# Patient Record
Sex: Female | Born: 1980 | Race: White | Hispanic: No | Marital: Married | Smoking: Never smoker
Health system: Southern US, Community
[De-identification: ages and names within clinical notes are randomized; demographics above are authoritative.]

## PROBLEM LIST (undated history)

## (undated) DIAGNOSIS — D6851 Activated protein C resistance: Secondary | ICD-10-CM

## (undated) DIAGNOSIS — I82409 Acute embolism and thrombosis of unspecified deep veins of unspecified lower extremity: Secondary | ICD-10-CM

## (undated) DIAGNOSIS — Z87442 Personal history of urinary calculi: Secondary | ICD-10-CM

## (undated) DIAGNOSIS — T8859XA Other complications of anesthesia, initial encounter: Secondary | ICD-10-CM

## (undated) DIAGNOSIS — D219 Benign neoplasm of connective and other soft tissue, unspecified: Secondary | ICD-10-CM

## (undated) DIAGNOSIS — G43909 Migraine, unspecified, not intractable, without status migrainosus: Secondary | ICD-10-CM

## (undated) DIAGNOSIS — E282 Polycystic ovarian syndrome: Secondary | ICD-10-CM

## (undated) DIAGNOSIS — D509 Iron deficiency anemia, unspecified: Secondary | ICD-10-CM

## (undated) DIAGNOSIS — J45909 Unspecified asthma, uncomplicated: Secondary | ICD-10-CM

## (undated) DIAGNOSIS — M779 Enthesopathy, unspecified: Secondary | ICD-10-CM

## (undated) DIAGNOSIS — T4145XA Adverse effect of unspecified anesthetic, initial encounter: Secondary | ICD-10-CM

## (undated) HISTORY — PX: MYOMECTOMY: SHX85

## (undated) HISTORY — PX: TONSILLECTOMY: SUR1361

## (undated) HISTORY — DX: Benign neoplasm of connective and other soft tissue, unspecified: D21.9

## (undated) HISTORY — PX: DILATION AND CURETTAGE OF UTERUS: SHX78

## (undated) HISTORY — PX: WISDOM TOOTH EXTRACTION: SHX21

## (undated) HISTORY — PX: WRIST SURGERY: SHX841

## (undated) HISTORY — DX: Iron deficiency anemia, unspecified: D50.9

## (undated) HISTORY — DX: Polycystic ovarian syndrome: E28.2

---
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
## 2006-05-22 ENCOUNTER — Emergency Department: Payer: Self-pay | Admitting: Emergency Medicine

## 2006-06-23 ENCOUNTER — Emergency Department: Payer: Self-pay | Admitting: Emergency Medicine

## 2006-11-15 ENCOUNTER — Emergency Department: Payer: Self-pay | Admitting: Emergency Medicine

## 2007-02-15 ENCOUNTER — Emergency Department: Payer: Self-pay | Admitting: Emergency Medicine

## 2009-04-23 ENCOUNTER — Ambulatory Visit: Payer: Self-pay | Admitting: Gynecologic Oncology

## 2009-05-21 ENCOUNTER — Ambulatory Visit: Payer: Self-pay | Admitting: Gynecologic Oncology

## 2010-11-13 ENCOUNTER — Emergency Department: Payer: Self-pay | Admitting: Emergency Medicine

## 2010-11-17 ENCOUNTER — Emergency Department: Payer: Self-pay | Admitting: Emergency Medicine

## 2011-01-13 ENCOUNTER — Ambulatory Visit: Payer: Self-pay | Admitting: Gynecologic Oncology

## 2011-01-22 ENCOUNTER — Ambulatory Visit: Payer: Self-pay | Admitting: Gynecologic Oncology

## 2011-03-05 ENCOUNTER — Ambulatory Visit: Payer: Self-pay | Admitting: Obstetrics & Gynecology

## 2011-03-11 ENCOUNTER — Ambulatory Visit: Payer: Self-pay | Admitting: Family Medicine

## 2011-03-12 ENCOUNTER — Ambulatory Visit: Payer: Self-pay | Admitting: Obstetrics & Gynecology

## 2011-03-13 LAB — PATHOLOGY REPORT

## 2011-05-12 ENCOUNTER — Ambulatory Visit: Payer: Self-pay | Admitting: Family Medicine

## 2013-04-20 ENCOUNTER — Ambulatory Visit: Payer: Self-pay | Admitting: Gastroenterology

## 2013-04-21 LAB — PATHOLOGY REPORT

## 2013-09-14 ENCOUNTER — Emergency Department: Payer: Self-pay | Admitting: Emergency Medicine

## 2013-09-14 LAB — URINALYSIS, COMPLETE
Glucose,UR: NEGATIVE mg/dL (ref 0–75)
Leukocyte Esterase: NEGATIVE
Nitrite: NEGATIVE
Ph: 5 (ref 4.5–8.0)
RBC,UR: 2731 /HPF (ref 0–5)
Squamous Epithelial: 1
WBC UR: 2 /HPF (ref 0–5)

## 2013-09-14 LAB — BASIC METABOLIC PANEL
Anion Gap: 8 (ref 7–16)
BUN: 17 mg/dL (ref 7–18)
Calcium, Total: 8.9 mg/dL (ref 8.5–10.1)
Co2: 25 mmol/L (ref 21–32)
EGFR (African American): >60
Glucose: 106 mg/dL — ABNORMAL HIGH (ref 65–99)
Osmolality: 278 (ref 275–301)
Sodium: 138 mmol/L (ref 136–145)

## 2013-09-14 LAB — APTT: Activated PTT: 29.3 secs (ref 23.6–35.9)

## 2013-09-14 LAB — CBC
HCT: 36.8 % (ref 35.0–47.0)
HGB: 12.3 g/dL (ref 12.0–16.0)
MCHC: 33.6 g/dL (ref 32.0–36.0)
MCV: 84 fL (ref 80–100)
RDW: 14 % (ref 11.5–14.5)

## 2013-09-14 LAB — HCG, QUANTITATIVE, PREGNANCY: Beta Hcg, Quant.: <1 m[IU]/mL — ABNORMAL LOW

## 2013-09-14 LAB — PROTIME-INR: INR: 0.9

## 2016-01-27 DIAGNOSIS — M7582 Other shoulder lesions, left shoulder: Secondary | ICD-10-CM | POA: Diagnosis not present

## 2016-06-17 DIAGNOSIS — Z124 Encounter for screening for malignant neoplasm of cervix: Secondary | ICD-10-CM | POA: Diagnosis not present

## 2016-06-17 DIAGNOSIS — Z01419 Encounter for gynecological examination (general) (routine) without abnormal findings: Secondary | ICD-10-CM | POA: Diagnosis not present

## 2017-05-08 ENCOUNTER — Ambulatory Visit
Admission: EM | Admit: 2017-05-08 | Discharge: 2017-05-08 | Disposition: A | Discharge status: Home / Self Care | Payer: 59 | Attending: Emergency Medicine | Admitting: Emergency Medicine

## 2017-05-08 DIAGNOSIS — J4521 Mild intermittent asthma with (acute) exacerbation: Secondary | ICD-10-CM | POA: Diagnosis not present

## 2017-05-08 DIAGNOSIS — J069 Acute upper respiratory infection, unspecified: Secondary | ICD-10-CM

## 2017-05-08 DIAGNOSIS — J020 Streptococcal pharyngitis: Secondary | ICD-10-CM | POA: Diagnosis not present

## 2017-05-08 HISTORY — DX: Unspecified asthma, uncomplicated: J45.909

## 2017-05-08 HISTORY — DX: Acute embolism and thrombosis of unspecified deep veins of unspecified lower extremity: I82.409

## 2017-05-08 HISTORY — DX: Migraine, unspecified, not intractable, without status migrainosus: G43.909

## 2017-05-08 HISTORY — DX: Activated protein C resistance: D68.51

## 2017-05-08 HISTORY — DX: Enthesopathy, unspecified: M77.9

## 2017-05-08 MED ORDER — AEROCHAMBER PLUS MISC: 2 refills | Status: AC

## 2017-05-08 MED ORDER — ALBUTEROL SULFATE HFA 108 (90 BASE) MCG/ACT IN AERS: 1.0000 | INHALATION_SPRAY | RESPIRATORY_TRACT | 0 refills | Status: DC | PRN

## 2017-05-08 MED ORDER — PREDNISONE 10 MG (21) PO TBPK: ORAL_TABLET | ORAL | 0 refills | Status: DC

## 2017-05-08 MED ORDER — HYDROCOD POLST-CPM POLST ER 10-8 MG/5ML PO SUER: 5.0000 mL | Freq: Two times a day (BID) | ORAL | 0 refills | Status: DC | PRN

## 2017-05-08 MED ORDER — PENICILLIN V POTASSIUM 500 MG PO TABS: 500.0000 mg | ORAL_TABLET | Freq: Two times a day (BID) | ORAL | 0 refills | Status: DC

## 2017-05-08 NOTE — ED Triage Notes (Signed)
Pt reports a positive strep test at work this a.m. Sore throat and cough x one week. Pain level 2/10. No fevers that she is aware of.

## 2017-05-08 NOTE — Discharge Instructions (Signed)
.    1 gram of Tylenol and 600 mg ibuprofen together 3-4 times a day as needed for pain.  Make sure you drink plenty of extra fluids.  Some people find salt water gargles and  Traditional Medicinal's "Throat Coat" tea helpful. Take 5 mL of liquid Benadryl and 5 mL of Maalox. Mix it together, and then hold it in your mouth for as long as you can and then swallow. You may do this 4 times a day.    Here is a list of primary care providers who are taking new patients:  Dr. Otilio Miu, Dr. Adline Potter [address] [place] [address] [phone]  Dr. Thersa Salt [address]  [place] [address]  [phone]  [facility] [place] [address]  [phone]  [facility] [address] [place], [place] [address] [phone]  [facility] [place]  [phone] [place], [place] [address]  Go to www.goodrx.com to look up your medications. This will give you a list of where you can find your prescriptions at the most affordable prices. Or ask the pharmacist what the cash price is. This can be less expensive than what you would pay with insurance.

## 2017-05-08 NOTE — ED Provider Notes (Signed)
HPI  SUBJECTIVE:  Tara Shea is a 36 y.o. female who presents with  sore throat described as raw, nasal congestion, rhinorrhea, postnasal drip, cough productive of the same material as her nasal congestion starting 6 days ago. She states that her asthma is now acting up due to this with wheezing, chest tightness, shortness of breath and shortness of breath with exertion. States that she is coughing all night long. She has been needing her rescue inhaler more frequently, which she states has stopped working and required a DuoNeb at work. This relieved her symptoms. Symptoms are worse with exertion and cough. No recent steroids. She is not on any control medicines because she only has asthma when she gets ill. 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 She denies was changes, drooling, trismus, sensation of throat swelling shut, difficulty breathing, body aches, rash, abdominal pain. Mild headache. She tried ibuprofen 600 mg without improvement in her symptoms. Sore throat is worse with coughing. She has a past medical history of recurrent strep and is status post tonsillectomy, asthma. No history of mono. LMP: 5/20. Denies possibility of being pregnant. PMD: None.     Past Medical History:  Diagnosis Date  . Asthma   . Bone spur   . DVT (deep venous thrombosis) ([place])   . Factor 5 Leiden mutation, heterozygous ([place])   . Migraines     Past Surgical History:  Procedure Laterality Date  . MYOMECTOMY    . TONSILLECTOMY      History reviewed. No pertinent family history.  Social History  Substance Use Topics  . Smoking status: Never Smoker  . Smokeless tobacco: Never Used  . Alcohol use No    No current facility-administered medications for this encounter.   Current Outpatient Prescriptions:  .  albuterol (PROVENTIL HFA;VENTOLIN HFA) 108 (90 Base) MCG/ACT inhaler, Inhale 1-2 puffs into the lungs every 4 (four) hours as needed for  wheezing or shortness of breath., Disp: 1 Inhaler, Rfl: 0 .  chlorpheniramine-HYDROcodone (TUSSIONEX PENNKINETIC ER) 10-8 MG/5ML SUER, Take 5 mLs by mouth every 12 (twelve) hours as needed for cough., Disp: 120 mL, Rfl: 0 .  penicillin v potassium (VEETID) 500 MG tablet, Take 1 tablet (500 mg total) by mouth 2 (two) times daily. X 10 days, Disp: 20 tablet, Rfl: 0 .  predniSONE (STERAPRED UNI-PAK 21 TAB) 10 MG (21) TBPK tablet, Dispense one 6 day pack. Take as directed with food., Disp: 21 tablet, Rfl: 0 .  Spacer/Aero-Holding Chambers (AEROCHAMBER PLUS) inhaler, Use as instructed, Disp: 1 each, Rfl: 2  No Known Allergies   ROS  As noted in HPI.   Physical Exam  BP 112/74 (BP Location: Right Arm)   Pulse 85   Temp 97.9 F (36.6 C) (Oral)   Resp 16   Ht 5\' 7"  (1.702 m)   Wt 297 lb (134.7 kg)   LMP 04/11/2017   SpO2 100%   BMI 46.52 kg/m   Constitutional: Well developed, well nourished, no acute distress Eyes:  EOMI, conjunctiva normal bilaterally HENT: Normocephalic, atraumatic,mucus membranes moist. Mild nasal congestion, normal nares. No sinus tenderness. Tonsils surgically absent. Oropharynx erythematous, positive petechiae on palate. Uvula midline. No obvious postnasal drip.  Neck: No cervical lymphadenopathy Respiratory: Coughing., Lungs clear bilaterally, good air movement.  no chest wall tenderness Normal inspiratory effort  Cardiovascular: Normal rate regular rhythm no murmurs rubs gallops  GI: nondistended no splenomegaly skin: No rash, skin  intact Musculoskeletal: no deformities Neurologic: Alert & oriented x 3, no focal neuro deficits Psychiatric: Speech and behavior appropriate   ED Course   Medications - No data to display  No orders of the defined types were placed in this encounter.   No results found for this or any previous visit (from the past 24 hour(s)). No results found.  ED Clinical Impression  Pharyngitis due to Streptococcus species  Mild  intermittent asthma with acute exacerbation  Viral upper respiratory illness   ED Assessment/Plan  Did not repeat strep due to positive strep in the ED. Presentation most consistent with a viral URI that has started off her asthma initially, but she may have picked up the strep while at work in the ED. She does have petechiae on her palate. We'll send home with 10 days of penicillin for the strep throat, she is continues ibuprofen 600 mg 1 g of Tylenol 3-4 times a day as needed for pain. Benadryl/Maalox mixture. For the asthma, refilling albuterol inhaler, spacer, prednisone taper, Tussionex. Will provide primary care referral.  Discussed MDM, plan and followup with patient. Discussed sn/sx that should prompt return to the ED. Patient agrees with plan.   Meds ordered this encounter  Medications  . DISCONTD: albuterol (PROVENTIL HFA;VENTOLIN HFA) 108 (90 Base) MCG/ACT inhaler    Sig: Inhale into the lungs every 6 (six) hours as needed for wheezing or shortness of breath.  Marland Kitchen albuterol (PROVENTIL HFA;VENTOLIN HFA) 108 (90 Base) MCG/ACT inhaler    Sig: Inhale 1-2 puffs into the lungs every 4 (four) hours as needed for wheezing or shortness of breath.    Dispense:  1 Inhaler    Refill:  0  . Spacer/Aero-Holding Chambers (AEROCHAMBER PLUS) inhaler    Sig: Use as instructed    Dispense:  1 each    Refill:  2  . chlorpheniramine-HYDROcodone (TUSSIONEX PENNKINETIC ER) 10-8 MG/5ML SUER    Sig: Take 5 mLs by mouth every 12 (twelve) hours as needed for cough.    Dispense:  120 mL    Refill:  0  . penicillin v potassium (VEETID) 500 MG tablet    Sig: Take 1 tablet (500 mg total) by mouth 2 (two) times daily. X 10 days    Dispense:  20 tablet    Refill:  0  . predniSONE (STERAPRED UNI-PAK 21 TAB) 10 MG (21) TBPK tablet    Sig: Dispense one 6 day pack. Take as directed with food.    Dispense:  21 tablet    Refill:  0    *This clinic note was created using [employer]. Therefore,  there may be occasional mistakes despite careful proofreading.  ?   Melynda Ripple, MD 05/08/17 [phone]

## 2018-03-16 DIAGNOSIS — S83282A Other tear of lateral meniscus, current injury, left knee, initial encounter: Secondary | ICD-10-CM | POA: Diagnosis not present

## 2018-03-21 ENCOUNTER — Ambulatory Visit
Admission: RE | Admit: 2018-03-21 | Discharge: 2018-03-21 | Disposition: A | Discharge status: Home / Self Care | Payer: 59 | Source: Ambulatory Visit | Attending: Orthopedic Surgery | Admitting: Orthopedic Surgery

## 2018-03-21 ENCOUNTER — Other Ambulatory Visit: Payer: Self-pay | Admitting: Orthopedic Surgery

## 2018-03-21 DIAGNOSIS — R52 Pain, unspecified: Secondary | ICD-10-CM

## 2018-03-21 DIAGNOSIS — M7989 Other specified soft tissue disorders: Secondary | ICD-10-CM | POA: Diagnosis not present

## 2018-03-21 DIAGNOSIS — M25562 Pain in left knee: Secondary | ICD-10-CM | POA: Diagnosis not present

## 2018-03-21 DIAGNOSIS — M25569 Pain in unspecified knee: Secondary | ICD-10-CM | POA: Diagnosis present

## 2018-06-15 ENCOUNTER — Other Ambulatory Visit: Payer: Self-pay

## 2018-06-15 ENCOUNTER — Ambulatory Visit
Admission: EM | Admit: 2018-06-15 | Discharge: 2018-06-15 | Disposition: A | Discharge status: Home / Self Care | Payer: 59 | Attending: Family Medicine | Admitting: Family Medicine

## 2018-06-15 ENCOUNTER — Encounter: Payer: Self-pay | Admitting: Emergency Medicine

## 2018-06-15 ENCOUNTER — Ambulatory Visit: Payer: Self-pay | Admitting: Obstetrics and Gynecology

## 2018-06-15 DIAGNOSIS — L03313 Cellulitis of chest wall: Secondary | ICD-10-CM

## 2018-06-15 DIAGNOSIS — L0390 Cellulitis, unspecified: Secondary | ICD-10-CM

## 2018-06-15 DIAGNOSIS — N611 Abscess of the breast and nipple: Secondary | ICD-10-CM | POA: Diagnosis not present

## 2018-06-15 DIAGNOSIS — L98499 Non-pressure chronic ulcer of skin of other sites with unspecified severity: Secondary | ICD-10-CM

## 2018-06-15 MED ORDER — SULFAMETHOXAZOLE-TRIMETHOPRIM 800-160 MG PO TABS: 1.0000 | ORAL_TABLET | Freq: Two times a day (BID) | ORAL | 0 refills | Status: DC

## 2018-06-15 NOTE — ED Triage Notes (Signed)
Patient reports that she has a sore on her left breast for the past 10 days.  Patient reports that she previously had a rash and cellulitis in that area.

## 2018-06-15 NOTE — ED Provider Notes (Signed)
[facility]    CSN: [id number] Arrival date & time: 06/15/18  1538     History   Chief Complaint Chief Complaint  Patient presents with  . Sore    HPI Tara Shea is a 37 y.o. female.   37 yo female with a c/o sore under left breast for about one week. Patient has been putting topical clindamycin cream to the area. Denies any fevers or chills. Has had a slight drainage.   The history is provided by the patient.    Past Medical History:  Diagnosis Date  . Asthma   . Bone spur   . DVT (deep venous thrombosis) ([place])   . Factor 5 Leiden mutation, heterozygous ([place])   . Migraines     There are no active problems to display for this patient.   Past Surgical History:  Procedure Laterality Date  . MYOMECTOMY    . TONSILLECTOMY    . WISDOM TOOTH EXTRACTION      OB History   None      Home Medications    Prior to Admission medications   Medication Sig Start Date End Date Taking? Authorizing Provider  albuterol (PROVENTIL HFA;VENTOLIN HFA) 108 (90 Base) MCG/ACT inhaler Inhale 1-2 puffs into the lungs every 4 (four) hours as needed for wheezing or shortness of breath. 05/08/17  Yes Melynda Ripple, MD  chlorpheniramine-HYDROcodone [facility] ER) 10-8 MG/5ML SUER Take 5 mLs by mouth every 12 (twelve) hours as needed for cough. 05/08/17   Melynda Ripple, MD  penicillin v potassium (VEETID) 500 MG tablet Take 1 tablet (500 mg total) by mouth 2 (two) times daily. X 10 days 05/08/17   Melynda Ripple, MD  predniSONE (STERAPRED UNI-PAK 21 TAB) 10 MG (21) TBPK tablet Dispense one 6 day pack. Take as directed with food. 05/08/17   Melynda Ripple, MD  Spacer/Aero-Holding Chambers (AEROCHAMBER PLUS) inhaler Use as instructed 05/08/17   Melynda Ripple, MD  sulfamethoxazole-trimethoprim (BACTRIM DS,SEPTRA DS) 800-160 MG tablet Take 1 tablet by mouth 2 (two) times daily. 06/15/18   Norval Gable, MD    Family History History reviewed. No  pertinent family history.  Social History Social History   Tobacco Use  . Smoking status: Never Smoker  . Smokeless tobacco: Never Used  Substance Use Topics  . Alcohol use: No  . Drug use: No     Allergies   Patient has no known allergies.   Review of Systems Review of Systems   Physical Exam Triage Vital Signs ED Triage Vitals  Enc Vitals Group     BP 06/15/18 1554 (!) 126/91     Pulse Rate 06/15/18 1554 91     Resp 06/15/18 1554 16     Temp 06/15/18 1554 98.2 F (36.8 C)     Temp Source 06/15/18 1554 Oral     SpO2 06/15/18 1554 99 %     Weight 06/15/18 1551 (!) 310 lb (140.6 kg)     Height 06/15/18 1551 5\' 8"  (1.727 m)     Head Circumference --      Peak Flow --      Pain Score 06/15/18 1550 0     Pain Loc --      Pain Edu? --      Excl. in [place]? --    No data found.  Updated Vital Signs BP (!) 126/91 (BP Location: Right Arm)   Pulse 91   Temp 98.2 F (36.8 C) (Oral)   Resp 16   Ht  5\' 8"  (1.727 m)   Wt (!) 310 lb (140.6 kg)   LMP 06/14/2018 (Exact Date)   SpO2 99%   BMI 47.14 kg/m   Visual Acuity Right Eye Distance:   Left Eye Distance:   Bilateral Distance:    Right Eye Near:   Left Eye Near:    Bilateral Near:     Physical Exam  Constitutional: She appears well-developed and well-nourished. No distress.  Skin: She is not diaphoretic.  1.5cm superficial skin ulceration under left breast with mild surrounding blanchable erythema and mild tenderness to palpation; no drainage  Nursing note and vitals reviewed.    UC Treatments / Results  Labs (all labs ordered are listed, but only abnormal results are displayed) Labs Reviewed - No data to display  EKG None  Radiology No results found.  Procedures Procedures (including critical care time)  Medications Ordered in UC Medications - No data to display  Initial Impression / Assessment and Plan / UC Course  I have reviewed the triage vital signs and the nursing notes.  Pertinent  labs & imaging results that were available during my care of the patient were reviewed by me and considered in my medical decision making (see chart for details).      Final Clinical Impressions(s) / UC Diagnoses   Final diagnoses:  Ulcer of skin of breast  Cellulitis, unspecified cellulitis site    ED Prescriptions    Medication Sig Dispense Auth. Provider   sulfamethoxazole-trimethoprim (BACTRIM DS,SEPTRA DS) 800-160 MG tablet Take 1 tablet by mouth 2 (two) times daily. 14 tablet Bryden Darden, Linward Foster, MD     1. diagnosis reviewed with patient 2. rx as per orders above; reviewed possible side effects, interactions, risks and benefits  3. Follow-up prn if symptoms worsen or don't improve  Controlled Substance Prescriptions [place] Controlled Substance Registry consulted? Not Applicable   Norval Gable, MD 06/15/18 [phone]

## 2018-06-29 ENCOUNTER — Encounter: Payer: Self-pay | Admitting: Maternal Newborn

## 2018-06-29 ENCOUNTER — Other Ambulatory Visit (HOSPITAL_COMMUNITY)
Admission: RE | Admit: 2018-06-29 | Discharge: 2018-06-29 | Disposition: A | Discharge status: Home / Self Care | Payer: 59 | Source: Ambulatory Visit | Attending: Maternal Newborn | Admitting: Maternal Newborn

## 2018-06-29 ENCOUNTER — Ambulatory Visit (INDEPENDENT_AMBULATORY_CARE_PROVIDER_SITE_OTHER): Payer: 59 | Admitting: Maternal Newborn

## 2018-06-29 VITALS — BP 120/86 | HR 90 | Ht 68.0 in | Wt 303.8 lb

## 2018-06-29 DIAGNOSIS — Z124 Encounter for screening for malignant neoplasm of cervix: Secondary | ICD-10-CM

## 2018-06-29 DIAGNOSIS — Z1151 Encounter for screening for human papillomavirus (HPV): Secondary | ICD-10-CM | POA: Insufficient documentation

## 2018-06-29 DIAGNOSIS — N946 Dysmenorrhea, unspecified: Secondary | ICD-10-CM

## 2018-06-29 DIAGNOSIS — Z1329 Encounter for screening for other suspected endocrine disorder: Secondary | ICD-10-CM

## 2018-06-29 DIAGNOSIS — Z01411 Encounter for gynecological examination (general) (routine) with abnormal findings: Secondary | ICD-10-CM | POA: Diagnosis not present

## 2018-06-29 DIAGNOSIS — R112 Nausea with vomiting, unspecified: Secondary | ICD-10-CM | POA: Diagnosis not present

## 2018-06-29 DIAGNOSIS — Z01419 Encounter for gynecological examination (general) (routine) without abnormal findings: Secondary | ICD-10-CM

## 2018-06-29 MED ORDER — ONDANSETRON 4 MG PO TBDP: 4.0000 mg | ORAL_TABLET | Freq: Four times a day (QID) | ORAL | 3 refills | Status: DC | PRN

## 2018-06-29 MED ORDER — KETOROLAC TROMETHAMINE 10 MG PO TABS: 10.0000 mg | ORAL_TABLET | Freq: Four times a day (QID) | ORAL | 0 refills | Status: DC | PRN

## 2018-06-29 NOTE — Progress Notes (Signed)
Gynecology Annual Exam  PCP: Arnetha Courser, MD  Chief Complaint:  Chief Complaint  Patient presents with  . 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 No obstetric history on file, presenting for an annual exam. The patient has no complaints today.   LMP: Patient's last menstrual period was 06/14/2018 (exact date). Average Interval: irregular Duration of flow: 7 days Heavy Menses: yes Intermenstrual Bleeding: no Postcoital Bleeding: no Dysmenorrhea: yes  The patient is sexually active. She currently uses condoms for contraception. She denies dyspareunia.  The patient does perform self breast exams.  There is notable family history of breast or ovarian cancer in her family.  The patient wears seatbelts: yes.   The patient has regular exercise: no.    The patient denies current symptoms of depression.    Review of Systems  Constitutional: Positive for malaise/fatigue.  HENT: Negative.   Eyes: Negative.   Respiratory: Negative for cough, shortness of breath and wheezing.   Cardiovascular: Negative for chest pain and palpitations.  Gastrointestinal: Positive for nausea.       With menstrual cycle  Genitourinary: Negative.   Musculoskeletal: Negative.   Skin: Negative.   Neurological: Positive for headaches.  Endo/Heme/Allergies: Negative.   Psychiatric/Behavioral: Negative.   All other systems reviewed and are negative.   Past Medical History:  Past Medical History:  Diagnosis Date  . Asthma   . Bone spur   . DVT (deep venous thrombosis) ([place])   . Factor 5 Leiden mutation, heterozygous ([place])   . Fibroid   . Migraines   . PCOS (polycystic ovarian syndrome)     Past Surgical History:  Past Surgical History:  Procedure Laterality Date  . MYOMECTOMY    . TONSILLECTOMY    . WISDOM TOOTH EXTRACTION    . WRIST SURGERY     age 60 - rebroke  it b/c it grew back wrong    Gynecologic History:  Patient's last menstrual period was 06/14/2018 (exact date). Contraception: condoms Last Pap: 06/17/2016.  Results were: NIL and HR HPV negative   Obstetric History: No obstetric history on file.  Family History:  Family History  Problem Relation Age of Onset  . Heart murmur Mother   . Thyroid disease Mother   . Migraines Mother   . Asthma Mother   . Diabetes Mother        TYPE 2  . Breast cancer Maternal Aunt 57  . Cancer Maternal Uncle        skin  . Cancer Maternal Grandmother 25       colon  . Hypertension Maternal Grandmother   . Cancer Maternal Aunt 50       colon  . Diabetes Maternal Aunt     Social History:  Social History   Socioeconomic History  . Marital status: Married    Spouse name: Not on file  . Number of children: 0  . Years of education: 3  . Highest education level: Not on file  Occupational History  . Occupation: Therapist, sports    Comment: ED  Social Needs  . Financial resource strain: Not on file  . Food insecurity:    Worry: Not on file    Inability: Not on file  . Transportation needs:    Medical: Not on file    Non-medical: Not on file  Tobacco  Use  . Smoking status: Never Smoker  . Smokeless tobacco: Never Used  Substance and Sexual Activity  . Alcohol use: Yes    Comment: occ  . Drug use: No  . Sexual activity: Yes    Birth control/protection: Condom  Lifestyle  . Physical activity:    Days per week: Not on file    Minutes per session: Not on file  . Stress: Not on file  Relationships  . Social connections:    Talks on phone: Not on file    Gets together: Not on file    Attends religious service: Not on file    Active member of club or organization: Not on file    Attends meetings of clubs or organizations: Not on file    Relationship status: Not on file  . Intimate partner violence:    Fear of current or ex partner: Not on file    Emotionally abused: Not on file    Physically  abused: Not on file    Forced sexual activity: Not on file  Other Topics Concern  . Not on file  Social History Narrative  . Not on file    Allergies:  No Known Allergies  Medications: Prior to Admission medications   Medication Sig Start Date End Date Taking? Authorizing Provider  albuterol (PROVENTIL HFA;VENTOLIN HFA) 108 (90 Base) MCG/ACT inhaler Inhale 1-2 puffs into the lungs every 4 (four) hours as needed for wheezing or shortness of breath. 05/08/17   Melynda Ripple, MD  chlorpheniramine-HYDROcodone (TUSSIONEX PENNKINETIC ER) 10-8 MG/5ML SUER Take 5 mLs by mouth every 12 (twelve) hours as needed for cough. 05/08/17   Melynda Ripple, MD  penicillin v potassium (VEETID) 500 MG tablet Take 1 tablet (500 mg total) by mouth 2 (two) times daily. X 10 days 05/08/17   Melynda Ripple, MD  predniSONE (STERAPRED UNI-PAK 21 TAB) 10 MG (21) TBPK tablet Dispense one 6 day pack. Take as directed with food. 05/08/17   Melynda Ripple, MD  Spacer/Aero-Holding Chambers (AEROCHAMBER PLUS) inhaler Use as instructed 05/08/17   Melynda Ripple, MD  sulfamethoxazole-trimethoprim (BACTRIM DS,SEPTRA DS) 800-160 MG tablet Take 1 tablet by mouth 2 (two) times daily. 06/15/18   Norval Gable, MD    Physical Exam Vitals: Blood pressure 120/86, pulse 90, height 5\' 8"  (1.727 m), weight (!) 303 lb 12 oz (137.8 kg), last menstrual period 06/14/2018.  General: NAD HEENT: normocephalic, anicteric Thyroid: no enlargement, no palpable nodules Pulmonary: No increased work of breathing, CTAB Cardiovascular: RRR, no murmurs, rubs, or gallops Breast: Breasts symmetrical, no tenderness, no palpable nodules or masses, no skin or nipple retraction present, no nipple discharge.  No axillary or supraclavicular lymphadenopathy. Healing area on left breast from cellulitis. Abdomen: Soft, non-tender, non-distended.  Umbilicus without lesions.  No hepatomegaly, splenomegaly or masses palpable. No evidence of hernia    Genitourinary:  External: Normal external female genitalia.  Normal  urethral meatus, normal Bartholin's and Skene's glands.    Vagina: Normal vaginal mucosa, no evidence of prolapse.    Cervix: Grossly normal in appearance, no bleeding  Uterus: Non-enlarged, mobile, normal contour.  No CMT  Adnexa: ovaries non-enlarged, no adnexal masses  Rectal: deferred  Lymphatic: no evidence of inguinal lymphadenopathy Extremities: no edema, erythema, or tenderness Neurologic: Grossly intact Psychiatric: mood appropriate, affect full  Assessment: 37 y.o. No obstetric history on file, routine annual exam.  Plan: Problem List Items Addressed This Visit    None    Visit Diagnoses    Women's annual routine  gynecological examination    -  Primary   Relevant Orders   Thyroid Panel With TSH (Completed)   Cytology - PAP (Completed)   Screening for thyroid disorder       Relevant Orders   Thyroid Panel With TSH (Completed)   Pap smear for cervical cancer screening       Relevant Orders   Cytology - PAP (Completed)   Non-intractable vomiting with nausea, unspecified vomiting type       Relevant Medications   ondansetron (ZOFRAN ODT) 4 MG disintegrating tablet   Dysmenorrhea       Relevant Medications   ketorolac (TORADOL) 10 MG tablet      1) STI screening was offered and declined.  2) ASCCP guidelines and rationale discussed.  Patient opts for every 3 year screening interval.  3) Contraception - Currently using condoms, history of migraine and DVT, not currently interested in a different method.  4) Routine healthcare maintenance discussed; thyroid labs ordered today.  5) Follow up 1 year for routine annual exam.  Avel Sensor, CNM 06/29/2018

## 2018-06-30 LAB — THYROID PANEL WITH TSH
FREE THYROXINE INDEX: 2.3 (ref 1.2–4.9)
T3 Uptake Ratio: 31 % (ref 24–39)
T4 TOTAL: 7.5 ug/dL (ref 4.5–12.0)
TSH: 2.6 u[IU]/mL (ref 0.450–4.500)

## 2018-07-01 LAB — CYTOLOGY - PAP
Adequacy: ABSENT
Diagnosis: NEGATIVE
HPV: NOT DETECTED

## 2018-07-04 ENCOUNTER — Encounter: Payer: Self-pay | Admitting: Maternal Newborn

## 2018-08-02 ENCOUNTER — Ambulatory Visit: Payer: 59 | Admitting: Family Medicine

## 2018-08-02 ENCOUNTER — Encounter: Payer: Self-pay | Admitting: Family Medicine

## 2018-08-02 VITALS — BP 124/78 | HR 102 | Temp 98.7°F | Ht 68.0 in | Wt 307.1 lb

## 2018-08-02 DIAGNOSIS — J4520 Mild intermittent asthma, uncomplicated: Secondary | ICD-10-CM | POA: Diagnosis not present

## 2018-08-02 DIAGNOSIS — N200 Calculus of kidney: Secondary | ICD-10-CM | POA: Diagnosis not present

## 2018-08-02 DIAGNOSIS — Z0000 Encounter for general adult medical examination without abnormal findings: Secondary | ICD-10-CM

## 2018-08-02 DIAGNOSIS — G43109 Migraine with aura, not intractable, without status migrainosus: Secondary | ICD-10-CM

## 2018-08-02 LAB — LIPID PANEL
CHOLESTEROL: 184 mg/dL (ref <200)
HDL: 64 mg/dL (ref >50)
LDL CHOLESTEROL (CALC): 103 mg/dL — AB
Non-HDL Cholesterol (Calc): 120 mg/dL (calc) (ref <130)
TRIGLYCERIDES: 76 mg/dL (ref <150)
Total CHOL/HDL Ratio: 2.9 (calc) (ref <5.0)

## 2018-08-02 LAB — CBC WITH DIFFERENTIAL/PLATELET
BASOS ABS: 28 {cells}/uL (ref 0–200)
Basophils Relative: 0.5 %
EOS ABS: 138 {cells}/uL (ref 15–500)
Eosinophils Relative: 2.5 %
HCT: 37.1 % (ref 35.0–45.0)
Hemoglobin: 11.9 g/dL (ref 11.7–15.5)
Lymphs Abs: 2035 cells/uL (ref 850–3900)
MCH: 26 pg — AB (ref 27.0–33.0)
MCHC: 32.1 g/dL (ref 32.0–36.0)
MCV: 81 fL (ref 80.0–100.0)
MONOS PCT: 6 %
MPV: 9.7 fL (ref 7.5–12.5)
NEUTROS PCT: 54 %
Neutro Abs: 2970 cells/uL (ref 1500–7800)
PLATELETS: 258 10*3/uL (ref 140–400)
RBC: 4.58 10*6/uL (ref 3.80–5.10)
RDW: 14 % (ref 11.0–15.0)
TOTAL LYMPHOCYTE: 37 %
WBC mixed population: 330 cells/uL (ref 200–950)
WBC: 5.5 10*3/uL (ref 3.8–10.8)

## 2018-08-02 LAB — COMPLETE METABOLIC PANEL WITH GFR
AG Ratio: 1.3 (calc) (ref 1.0–2.5)
ALKALINE PHOSPHATASE (APISO): 59 U/L (ref 33–115)
ALT: 10 U/L (ref 6–29)
AST: 13 U/L (ref 10–30)
Albumin: 4 g/dL (ref 3.6–5.1)
BILIRUBIN TOTAL: 0.3 mg/dL (ref 0.2–1.2)
BUN: 13 mg/dL (ref 7–25)
CHLORIDE: 106 mmol/L (ref 98–110)
CO2: 23 mmol/L (ref 20–32)
Calcium: 9.1 mg/dL (ref 8.6–10.2)
Creat: 0.79 mg/dL (ref 0.50–1.10)
GFR, Est African American: 111 mL/min/{1.73_m2} (ref >=60)
GFR, Est Non African American: 96 mL/min/{1.73_m2} (ref >=60)
GLUCOSE: 95 mg/dL (ref 65–99)
Globulin: 3 g/dL (calc) (ref 1.9–3.7)
POTASSIUM: 4 mmol/L (ref 3.5–5.3)
Sodium: 137 mmol/L (ref 135–146)
Total Protein: 7 g/dL (ref 6.1–8.1)

## 2018-08-02 NOTE — Assessment & Plan Note (Signed)
Offered patient assistance; she politely declined, saying she knows what to do

## 2018-08-02 NOTE — Assessment & Plan Note (Addendum)
Controlled; flu shots at work; she may call for daily controller if she starts working out more often and needs Rx

## 2018-08-02 NOTE — Patient Instructions (Addendum)
Check out the information at familydoctor.org entitled "Nutrition for Weight Loss: What You Need to Know about Fad Diets" Try to lose between 1-2 pounds per week by taking in fewer calories and burning off more calories You can succeed by limiting portions, limiting foods dense in calories and fat, becoming more active, and drinking 8 glasses of water a day (64 ounces) Don't skip meals, especially breakfast, as skipping meals may alter your metabolism Do not use over-the-counter weight loss pills or gimmicks that claim rapid weight loss A healthy BMI (or body mass index) is between 18.5 and 24.9 You can calculate your ideal BMI at the [place] website [employer]Mon[employer]fr  If you have not heard anything from my staff in a week about any orders/referrals/studies from today, please contact us here to follow-up [phone]

## 2018-08-02 NOTE — Progress Notes (Signed)
BP 124/78   Pulse (!) 102   Temp 98.7 F (37.1 C)   Ht 5\' 8"  (1.727 m)   Wt (!) 307 lb 1.6 oz (139.3 kg)   LMP 07/07/2018   SpO2 99%   BMI 46.69 kg/m    Subjective:    Patient ID: Tara Shea, female    DOB: 1981-05-18, 37 y.o.   MRN: [id number]  HPI: Tara Shea is a 37 y.o. female  Chief Complaint  Patient presents with  . New Patient (Initial Visit)    HPI  Patient is here to establish care  She has been going to [facility] for pap smears; she had a normal one a few weeks ago  Migraines; not too often; none in 1.5 to 2 years; seasonal allergies or sinus infections; mother has migraines frequently; she has multiple health issues and takes medicines so some of her headaches might be from meds; some might be psych issues; patient's migraines are with aura and unilateral pain and nausea; just nasal imitrex  Factor V leiden, uses condoms for contraception, cannot use combination hormone OCPs Had a provoked DVT left leg; long car trip to [place] a [place] the [place]; left lower leg; was on Lovenox then warfarin; had heavy bleeding from fibroids, then myomectomy, then on blood thinners total 8 months, then doctor stopped it; has low hemoglobin, not monitored lately  Has bad periods and uses the toradol and phenergan or zofran per GYN  Mother had thyroid issues and patient had thyroid tested last month; lab was off in high school then  Asthma; just when a child; mother was a heavy smoker; just exacerbations with a cold or with seasonal allergies, people cutting grass; SABA about once a month; no steroids for a while  Morbid obesity; not back in the gym; goes once in a while  She gets flu shots at work Up-to-Date reviewed with patient: "For individuals with heterozygous FVL mutation and a single provoked VTE, indefinite anticoagulation generally is not required after an initial three to six months of treatment"  PCOS; sometimes heavy periods,  managed by GYN  Depression screen PHQ 2/9 08/02/2018  Decreased Interest 0  Down, Depressed, Hopeless 0  PHQ - 2 Score 0    Relevant past medical, surgical, family and social history reviewed Past Medical History:  Diagnosis Date  . Asthma   . Bone spur   . DVT (deep venous thrombosis) ([place])   . Factor 5 Leiden mutation, heterozygous ([place])   . Fibroid   . Migraines   . PCOS (polycystic ovarian syndrome)    Past Surgical History:  Procedure Laterality Date  . MYOMECTOMY    . TONSILLECTOMY    . WISDOM TOOTH EXTRACTION    . WRIST SURGERY     age 60 - rebroke it b/c it grew back wrong   Family History  Problem Relation Age of Onset  . Heart murmur Mother   . Thyroid disease Mother   . Migraines Mother   . Asthma Mother   . Diabetes Mother        TYPE 2  . Bipolar disorder Mother   . Breast cancer Maternal Aunt 5  . Cancer Maternal Uncle        skin  . Cancer Maternal Grandmother 29       colon  . Hypertension Maternal Grandmother   . Cancer Maternal Aunt 50       colon  . Diabetes Maternal Aunt  Social History   Tobacco Use  . Smoking status: Never Smoker  . Smokeless tobacco: Never Used  Substance Use Topics  . Alcohol use: Yes    Comment: occ  . Drug use: No    Interim medical history since last visit reviewed. Allergies and medications reviewed  Review of Systems Per HPI unless specifically indicated above     Objective:    BP 124/78   Pulse (!) 102   Temp 98.7 F (37.1 C)   Ht 5\' 8"  (1.727 m)   Wt (!) 307 lb 1.6 oz (139.3 kg)   LMP 07/07/2018   SpO2 99%   BMI 46.69 kg/m   Wt Readings from Last 3 Encounters:  08/02/18 (!) 307 lb 1.6 oz (139.3 kg)  06/29/18 (!) 303 lb 12 oz (137.8 kg)  06/15/18 (!) 310 lb (140.6 kg)    Physical Exam  Constitutional: She appears well-developed and well-nourished. No distress.  HENT:  Head: Normocephalic and atraumatic.  Eyes: EOM are normal. No scleral icterus.  Neck: No thyromegaly present.    Cardiovascular: Normal rate, regular rhythm and normal heart sounds.  No murmur heard. Pulmonary/Chest: Effort normal and breath sounds normal. No respiratory distress. She has no wheezes.  Abdominal: She exhibits no distension.  Musculoskeletal: She exhibits no edema.  Neurological: She is alert.  Skin: Skin is warm and dry. She is not diaphoretic. No pallor.  Psychiatric: She has a normal mood and affect. Her behavior is normal. Judgment and thought content normal.    Results for orders placed or performed in visit on 06/29/18  Thyroid Panel With TSH  Result Value Ref Range   TSH 2.600 0.450 - 4.500 uIU/mL   T4, Total 7.5 4.5 - 12.0 ug/dL   T3 Uptake Ratio 31 24 - 39 %   Free Thyroxine Index 2.3 1.2 - 4.9  Cytology - PAP  Result Value Ref Range   Adequacy      Satisfactory for evaluation  endocervical/transformation zone component ABSENT.   Diagnosis      NEGATIVE FOR INTRAEPITHELIAL LESIONS OR MALIGNANCY.   HPV NOT DETECTED    Material Submitted CervicoVaginal Pap [ThinPrep Imaged]    CYTOLOGY - PAP PAP RESULT       Assessment & Plan:   Problem List Items Addressed This Visit      Cardiovascular and Mediastinum   Migraine headache with aura - Primary    Very infrequent; avoid triggers        Respiratory   Asthma, mild intermittent    Controlled; flu shots at work; she may call for daily controller if she starts working out more often and needs Rx        Genitourinary   Kidney stone    On well water at the time; no fam hx, none since        Other   Morbid obesity ([place])    Offered patient assistance; she politely declined, saying she knows what to do       Other Visit Diagnoses    Preventative health care       Relevant Orders   CBC with Differential/Platelet   COMPLETE METABOLIC PANEL WITH GFR   Lipid panel       Follow up plan: No follow-ups on file.  An after-visit summary was printed and given to the patient at [facility].  Please see the  patient instructions which may contain other information and recommendations beyond what is mentioned above in the assessment and plan.  No  orders of the defined types were placed in this encounter.   Orders Placed This Encounter  Procedures  . CBC with Differential/Platelet  . COMPLETE METABOLIC PANEL WITH GFR  . Lipid panel

## 2018-08-02 NOTE — Assessment & Plan Note (Signed)
On well water at the time; no fam hx, none since

## 2018-08-02 NOTE — Assessment & Plan Note (Signed)
Very infrequent; avoid triggers

## 2018-09-13 DIAGNOSIS — H5213 Myopia, bilateral: Secondary | ICD-10-CM | POA: Diagnosis not present

## 2018-09-13 DIAGNOSIS — H52223 Regular astigmatism, bilateral: Secondary | ICD-10-CM | POA: Diagnosis not present

## 2018-10-13 ENCOUNTER — Telehealth: Payer: 59 | Admitting: Family Medicine

## 2018-10-13 DIAGNOSIS — B009 Herpesviral infection, unspecified: Secondary | ICD-10-CM | POA: Diagnosis not present

## 2018-10-13 MED ORDER — VALACYCLOVIR HCL 1 G PO TABS: 2000.0000 mg | ORAL_TABLET | Freq: Two times a day (BID) | ORAL | 0 refills | Status: AC

## 2018-10-13 NOTE — Progress Notes (Signed)
We are sorry that you are not feeling well.  Here is how we plan to help!  I have sent a medication in to the pharmacy for your cold sores but recommend a face to face evaluation for the other symptoms to ensure comprehensive and accurate diagnosis and treatment.  Based on what you have shared with me it does look like you have a viral infection.    Most cold sores or fever blisters are small fluid filled blisters around the mouth caused by herpes simplex virus.  The most common strain of the virus causing cold sores is herpes simplex virus 1.  It can be spread by skin contact, sharing eating utensils, or even sharing towels.  Cold sores are contagious to other people until dry. (Approximately 5-7 days).  Wash your hands. You can spread the virus to your eyes through handling your contact lenses after touching the lesions.  Most people experience pain at the sight or tingling sensations in their lips that may begin before the ulcers erupt.  Herpes simplex is treatable but not curable.  It may lie dormant for a long time and then reappear due to stress or prolonged sun exposure.  Many patients have success in treating their cold sores with an over the counter topical called Abreva.  You may apply the cream up to 5 times daily (maximum 10 days) until healing occurs.  If you would like to use an oral antiviral medication to speed the healing of your cold sore, I have sent a prescription to your local pharmacy Valacyclovir 2 gm twice daily for 1 day    HOME CARE:   Wash your hands frequently.  Do not pick at or rub the sore.  Don't open the blisters.  Avoid kissing other people during this time.  Avoid sharing drinking glasses, eating utensils, or razors.  Do not handle contact lenses unless you have thoroughly washed your hands with soap and warm water!  Avoid oral sex during this time.  Herpes from sores on your mouth can spread to your partner's genital area.  Avoid contact with anyone  who has eczema or a weakened immune system.  Cold sores are often triggered by exposure to intense sunlight, use a lip balm containing a sunscreen (SPF 30 or higher).  GET HELP RIGHT AWAY IF:   Blisters look infected.  Blisters occur near or in the eye.  Symptoms last longer than 10 days.  Your symptoms become worse.  MAKE SURE YOU:   Understand these instructions.  Will watch your condition.  Will get help right away if you are not doing well or get worse.    Your e-visit answers were reviewed by a board certified advanced clinical practitioner to complete your personal care plan.  Depending upon the condition, your plan could have  Included both over the counter or prescription medications.    Please review your pharmacy choice.  Be sure that the pharmacy you have chosen is open so that you can pick up your prescription now.  If there is a problem you can message your provider in [place] to have the prescription routed to another pharmacy.    Your safety is important to [place].  If you have drug allergies check our prescription carefully.  For the next 24 hours you can use MyChart to ask questions about today's visit, request a non-urgent call back, or ask for a work or school excuse from your e-visit provider.  You will get an email in the next  two days asking about your experience.  I hope that your e-visit has been valuable and will speed your recovery.  Based on what you shared with me it looks like you have a condition that should be evaluated in a face to face office visit.  NOTE: If you entered your credit card information for this eVisit, you will not be charged. You may see a "hold" on your card for the $30 but that hold will drop off and you will not have a charge processed.  If you are having a true medical emergency please call 911.  If you need an urgent face to face visit, [place] has four urgent care centers for your convenience.  If you need care fast and  have a high deductible or no insurance consider:   [email or web address] to reserve your spot online an avoid wait times  [facility] [address] [place], [place] [address] 8 am to 8 pm Monday-Friday 10 am to 4 pm Saturday-Sunday *Across the street from [employer]  [place], [address] 8 am to 5 pm Monday-Friday * In the [facility] on the [facility]   The following sites will take your  insurance:  . [facility] [place] a Provider at this Location  [address] [place], [place] [address] . 10 am to 8 pm Monday-Friday . 12 pm to 8 pm Saturday-Sunday   . [facility] at [place] a Provider at this Location  [place], [place] [address] . 8 am to 8 pm Monday-Friday . 9 am to 6 pm Saturday . 11 am to 6 pm Sunday   . [facility] [place] Get Driving Directions  [address] [place],  [address] . 8 am to 8 pm Monday-Friday . 8 am to 4 pm Saturday-Sunday   Your e-visit answers were reviewed by a board certified advanced clinical practitioner to complete your personal care plan.  Thank you for using e-Visits.

## 2018-10-26 ENCOUNTER — Other Ambulatory Visit: Payer: Self-pay

## 2018-10-26 DIAGNOSIS — N946 Dysmenorrhea, unspecified: Secondary | ICD-10-CM

## 2018-10-26 NOTE — Telephone Encounter (Signed)
advise

## 2018-10-27 MED ORDER — KETOROLAC TROMETHAMINE 10 MG PO TABS: 10.0000 mg | ORAL_TABLET | Freq: Four times a day (QID) | ORAL | 0 refills | Status: DC | PRN

## 2018-11-28 ENCOUNTER — Telehealth: Payer: 59 | Admitting: Family Medicine

## 2018-11-28 ENCOUNTER — Encounter: Payer: Self-pay | Admitting: Emergency Medicine

## 2018-11-28 ENCOUNTER — Other Ambulatory Visit: Payer: Self-pay

## 2018-11-28 ENCOUNTER — Encounter: Payer: Self-pay | Admitting: Family Medicine

## 2018-11-28 ENCOUNTER — Ambulatory Visit
Admission: EM | Admit: 2018-11-28 | Discharge: 2018-11-28 | Disposition: A | Discharge status: Home / Self Care | Payer: 59 | Attending: Family Medicine | Admitting: Family Medicine

## 2018-11-28 DIAGNOSIS — Z8709 Personal history of other diseases of the respiratory system: Secondary | ICD-10-CM

## 2018-11-28 DIAGNOSIS — R05 Cough: Secondary | ICD-10-CM

## 2018-11-28 DIAGNOSIS — R062 Wheezing: Secondary | ICD-10-CM

## 2018-11-28 DIAGNOSIS — J45909 Unspecified asthma, uncomplicated: Secondary | ICD-10-CM | POA: Insufficient documentation

## 2018-11-28 DIAGNOSIS — J069 Acute upper respiratory infection, unspecified: Secondary | ICD-10-CM | POA: Diagnosis not present

## 2018-11-28 DIAGNOSIS — R059 Cough, unspecified: Secondary | ICD-10-CM

## 2018-11-28 MED ORDER — ALBUTEROL SULFATE (2.5 MG/3ML) 0.083% IN NEBU: 2.5000 mg | INHALATION_SOLUTION | Freq: Four times a day (QID) | RESPIRATORY_TRACT | 12 refills | Status: DC | PRN

## 2018-11-28 MED ORDER — HYDROCOD POLST-CPM POLST ER 10-8 MG/5ML PO SUER: 5.0000 mL | Freq: Two times a day (BID) | ORAL | 0 refills | Status: DC | PRN

## 2018-11-28 MED ORDER — PREDNISONE 20 MG PO TABS: 40.0000 mg | ORAL_TABLET | Freq: Every day | ORAL | 0 refills | Status: AC

## 2018-11-28 NOTE — Discharge Instructions (Addendum)
-  Prednisone: 40 mg daily for 5 days -Continue with home albuterol as needed -Tussionex: every 12 hours as needed for cough -fluids -follow up with PCP if no improvement

## 2018-11-28 NOTE — ED Provider Notes (Signed)
[facility]    CSN: [id number] Arrival date & time: 11/28/18  1753     History   Chief Complaint Chief Complaint  Patient presents with  . Cough    APPT  . Nasal Congestion    HPI Tara Shea is a 38 y.o. female.   Patient is a 38 year old female who presents with complaint of cough and congestion since last Saturday.  Patient reports productive cough of clear yellow mucus.  Patient also reports postnasal drainage.  Patient does have a history of asthma and has needed to use her inhaler more this week.  States she is out of her nebulizer.  States she works a night shift and since last night has used her inhaler 3 times and reports that she only has she is about 1 time per week.  Patient denies any fever at this point but states she did have some fevers when this started last week.  Patient denies any chills.  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  She did get the flu shot.  Patient is been taking Delsym with minimal help in some Mucinex.  Also reports taking tea with honey.     Past Medical History:  Diagnosis Date  . Asthma   . Bone spur   . DVT (deep venous thrombosis) ([place])   . Factor 5 Leiden mutation, heterozygous ([place])   . Fibroid   . Migraines   . PCOS (polycystic ovarian syndrome)     Patient Active Problem List   Diagnosis Date Noted  . Kidney stone 08/02/2018  . Asthma, mild intermittent 08/02/2018  . Migraine headache with aura 08/02/2018  . Morbid obesity ([place]) 08/02/2018    Past Surgical History:  Procedure Laterality Date  . MYOMECTOMY    . TONSILLECTOMY    . WISDOM TOOTH EXTRACTION    . WRIST SURGERY     age 50 - rebroke it b/c it grew back wrong    OB History    Gravida  0   Para  0   Term  0   Preterm  0   AB  0   Living  0     SAB  0   TAB  0   Ectopic  0   Multiple  0   Live Births  0            Home Medications    Prior to Admission medications   Medication Sig  Start Date End Date Taking? Authorizing Provider  albuterol (PROVENTIL HFA;VENTOLIN HFA) 108 (90 Base) MCG/ACT inhaler Inhale 1-2 puffs into the lungs every 4 (four) hours as needed for wheezing or shortness of breath. 05/08/17  Yes Melynda Ripple, MD  ketorolac (TORADOL) 10 MG tablet Take 1 tablet (10 mg total) by mouth every 6 (six) hours as needed. 10/27/18  Yes Rexene Agent, CNM  ondansetron (ZOFRAN ODT) 4 MG disintegrating tablet Take 1 tablet (4 mg total) by mouth every 6 (six) hours as needed for nausea. 06/29/18  Yes Rexene Agent, CNM  promethazine (PHENERGAN) 25 MG tablet Take 25 mg by mouth as needed for nausea or vomiting.   Yes [provider]  Spacer/Aero-Holding Chambers (AEROCHAMBER PLUS) inhaler Use as instructed 05/08/17  Yes Melynda Ripple, MD  SUMAtriptan (IMITREX) 20 MG/ACT nasal spray Place 20 mg into the nose every 2 (two) hours as needed for migraine or headache. May repeat in 2 hours if headache persists or recurs.  Yes [provider]  albuterol (PROVENTIL) (2.5 MG/3ML) 0.083% nebulizer solution Take 3 mLs (2.5 mg total) by nebulization every 6 (six) hours as needed for wheezing or shortness of breath. 11/28/18   Luvenia Redden, PA-C  chlorpheniramine-HYDROcodone (TUSSIONEX PENNKINETIC ER) 10-8 MG/5ML SUER Take 5 mLs by mouth every 12 (twelve) hours as needed for cough. 11/28/18   Luvenia Redden, PA-C  predniSONE (DELTASONE) 20 MG tablet Take 2 tablets (40 mg total) by mouth daily for 5 days. 11/28/18 12/03/18  Luvenia Redden, PA-C    Family History Family History  Problem Relation Age of Onset  . Heart murmur Mother   . Thyroid disease Mother   . Migraines Mother   . Asthma Mother   . Diabetes Mother        TYPE 2  . Bipolar disorder Mother   . Breast cancer Maternal Aunt 41  . Cancer Maternal Uncle        skin  . Cancer Maternal Grandmother 51       colon  . Hypertension Maternal Grandmother   . Cancer Maternal Aunt 50        colon  . Diabetes Maternal Aunt     Social History Social History   Tobacco Use  . Smoking status: Never Smoker  . Smokeless tobacco: Never Used  Substance Use Topics  . Alcohol use: Yes    Comment: occ  . Drug use: No     Allergies   Patient has no known allergies.   Review of Systems Review of Systems as above in HPI.  Other system reviewed and found to be negative   Physical Exam Triage Vital Signs ED Triage Vitals  Enc Vitals Group     BP 11/28/18 1810 126/77     Pulse Rate 11/28/18 1810 73     Resp 11/28/18 1810 20     Temp 11/28/18 1810 98.3 F (36.8 C)     Temp Source 11/28/18 1810 Oral     SpO2 11/28/18 1810 100 %     Weight 11/28/18 1807 (!) 310 lb (140.6 kg)     Height 11/28/18 1807 5\' 8"  (1.727 m)     Head Circumference --      Peak Flow --      Pain Score 11/28/18 1807 0     Pain Loc --      Pain Edu? --      Excl. in [place]? --    No data found.  Updated Vital Signs BP 126/77 (BP Location: Right Arm)   Pulse 73   Temp 98.3 F (36.8 C) (Oral)   Resp 20   Ht 5\' 8"  (1.727 m)   Wt (!) 310 lb (140.6 kg)   LMP 11/14/2018   SpO2 100%   BMI 47.14 kg/m   Physical Exam Constitutional:      Appearance: Normal appearance. She is obese.  HENT:     Right Ear: Tympanic membrane normal.     Left Ear: Tympanic membrane normal.     Nose: Nose normal.     Mouth/Throat:     Comments: Clear postnasal drainage. Eyes:     Extraocular Movements: Extraocular movements intact.     Pupils: Pupils are equal, round, and reactive to light.  Cardiovascular:     Rate and Rhythm: Normal rate and regular rhythm.  Pulmonary:     Effort: Pulmonary effort is normal.     Breath sounds: No wheezing or rhonchi.     Comments: Breath  sounds diminished.  Cough with forced expiration. Musculoskeletal: Normal range of motion.  Skin:    General: Skin is warm and dry.  Neurological:     Mental Status: She is alert and oriented to person, place, and time.  Psychiatric:          Mood and Affect: Mood normal.        Behavior: Behavior normal.      UC Treatments / Results  Labs (all labs ordered are listed, but only abnormal results are displayed) Labs Reviewed - No data to display  EKG None  Radiology No results found.  Procedures Procedures (including critical care time)  Medications Ordered in UC Medications - No data to display  Initial Impression / Assessment and Plan / UC Course  I have reviewed the triage vital signs and the nursing notes.  Pertinent labs & imaging results that were available during my care of the patient were reviewed by me and considered in my medical decision making (see chart for details).    Patient presents with upper respiratory symptoms and cough that it began about 10 days ago.  Patient reports cough with clear yellow sputum.  Patient having to use her albuterol inhaler more frequently over the last 10 days than normal.  Patient states she is out of her nebulizer solution to have to use inhaler only.  We will go ahead and give her prescription for her albuterol nebulizer.  Also give her prescription for Tussionex cough syrup.  Also give her prednisone 40 mg daily for 5 days to help deal with the likely added inflammation her chest and her ongoing asthma issues.  Final Clinical Impressions(s) / UC Diagnoses   Final diagnoses:  Upper respiratory tract infection, unspecified type  Asthma, unspecified asthma severity, unspecified whether complicated, unspecified whether persistent     Discharge Instructions     -Prednisone: 40 mg daily for 5 days -Continue with home albuterol as needed -Tussionex: every 12 hours as needed for cough -fluids -follow up with PCP if no improvement     ED Prescriptions    Medication Sig Dispense Auth. Provider   albuterol (PROVENTIL) (2.5 MG/3ML) 0.083% nebulizer solution Take 3 mLs (2.5 mg total) by nebulization every 6 (six) hours as needed for wheezing or shortness of breath.  75 mL Luvenia Redden, PA-C   predniSONE (DELTASONE) 20 MG tablet Take 2 tablets (40 mg total) by mouth daily for 5 days. 10 tablet Luvenia Redden, PA-C   chlorpheniramine-HYDROcodone [facility] ER) 10-8 MG/5ML SUER  (Status: Discontinued) Take 5 mLs by mouth every 12 (twelve) hours as needed for cough. 140 mL Luvenia Redden, PA-C   chlorpheniramine-HYDROcodone [facility] ER) 10-8 MG/5ML SUER Take 5 mLs by mouth every 12 (twelve) hours as needed for cough. 140 mL Luvenia Redden, PA-C     Controlled Substance Prescriptions [place] Controlled Substance Registry consulted? Yes, I have consulted the [place] Controlled Substances Registry for this patient, and feel the risk/benefit ratio today is favorable for proceeding with this prescription for a controlled substance.  Phone call for the patient stating that the initial pharmacy was out of the Tussionex.  That order was discontinued and resent to a different pharmacy, [facility] [place], [place] 11/28/18 2053

## 2018-11-28 NOTE — ED Triage Notes (Signed)
Patient c/o productive cough and nasal congestion and pain that started 10 days ago. Patient states that she has had to use her rescue inhaler a lot over the last 10 days.

## 2018-11-28 NOTE — Progress Notes (Signed)
Based on what you shared with me it looks like you have a condition that should be evaluated in a face to face office visit.  NOTE: If you entered your credit card information for this eVisit, you will not be charged. You may see a "hold" on your card for the $30 but that hold will drop off and you will not have a charge processed.  If you are having a true medical emergency please call 911.  If you need an urgent face to face visit, [place] has four urgent care centers for your convenience.  If you need care fast and have a high deductible or no insurance consider:   [email or web address] to reserve your spot online an avoid wait times  [facility] [address], [address] [place], [place] [address] 8 am to 8 pm Monday-Friday 10 am to 4 pm Saturday-Sunday *Across the street from [employer]  [facility]  [address] [place], [address] 8 am to 5 pm Monday-Friday * In the [facility] [place] [facility] on the [facility]   The following sites will take your  insurance:  . [facility]  [phone] Get Driving Directions Find a Provider at this Location  [address] , [place] [address] . 10 am to 8 pm Monday-Friday . 12 pm to 8 pm Saturday-Sunday   . [facility]  [phone] Get Driving Directions Find a Provider at this Location  [address], [address] [place], [place] [address] . 8 am to 8 pm Monday-Friday . 9 am to 6 pm Saturday . 11 am to 6 pm Sunday   . [facility] [place]  [phone] Get Driving Directions  [address] [place], [place] [address] . 8 am to 8 pm Monday-Friday . 8 am to 4 pm Saturday-Sunday   Your e-visit answers were reviewed by a board certified advanced clinical practitioner to complete your personal care plan.  Thank you for using e-Visits. 

## 2018-11-30 ENCOUNTER — Telehealth: Payer: Self-pay | Admitting: Family Medicine

## 2018-11-30 NOTE — Telephone Encounter (Signed)
It looks like another provider prescribed nebulizer medicine for her; please have her contact that provider I don't have a reason why she can't use the SABA and can't justify that to insurance

## 2018-11-30 NOTE — Telephone Encounter (Signed)
Copied from [place] [id number]. Topic: Quick Communication - See Telephone Encounter >> Nov 30, 2018  8:35 AM Burchel, Abbi R wrote: CRM for notification. See Telephone encounter for: 11/30/18.  Pt states her nebulizer is broken, and she spoke with her pharmacy and was advised that she would need a rx for a new one and that she would need to get the new nebulizer from from a medical supply store to avoid paying cash for it.   Pt: [phone]

## 2018-11-30 NOTE — Telephone Encounter (Signed)
Pt notified, states her 1 st appt here at our office was in august and that she does not see pulmonologist.  Notified she would need an appt to see if Dr. lada would be willing to give?  Pt denied

## 2019-02-06 ENCOUNTER — Other Ambulatory Visit: Payer: Self-pay

## 2019-02-06 DIAGNOSIS — N946 Dysmenorrhea, unspecified: Secondary | ICD-10-CM

## 2019-03-01 ENCOUNTER — Ambulatory Visit (INDEPENDENT_AMBULATORY_CARE_PROVIDER_SITE_OTHER): Payer: 59 | Admitting: Family Medicine

## 2019-03-01 ENCOUNTER — Encounter: Payer: Self-pay | Admitting: Family Medicine

## 2019-03-01 ENCOUNTER — Other Ambulatory Visit: Payer: Self-pay

## 2019-03-01 DIAGNOSIS — J4520 Mild intermittent asthma, uncomplicated: Secondary | ICD-10-CM | POA: Diagnosis not present

## 2019-03-01 DIAGNOSIS — J309 Allergic rhinitis, unspecified: Secondary | ICD-10-CM

## 2019-03-01 IMAGING — CR DG KNEE 3 VIEWS*L*
1 series · 4 of 4 positions shown · non-contrast
Comparison: None.

CLINICAL DATA: 36-year-old female with left knee pain and swelling
since [REDACTED]. No known injury. Initial encounter.

EXAM:
LEFT KNEE - 3 VIEW

[Series 1: dg knee 3 views left · 0.14mm/px · 4 of 4 slices shown]
[im 1/4]
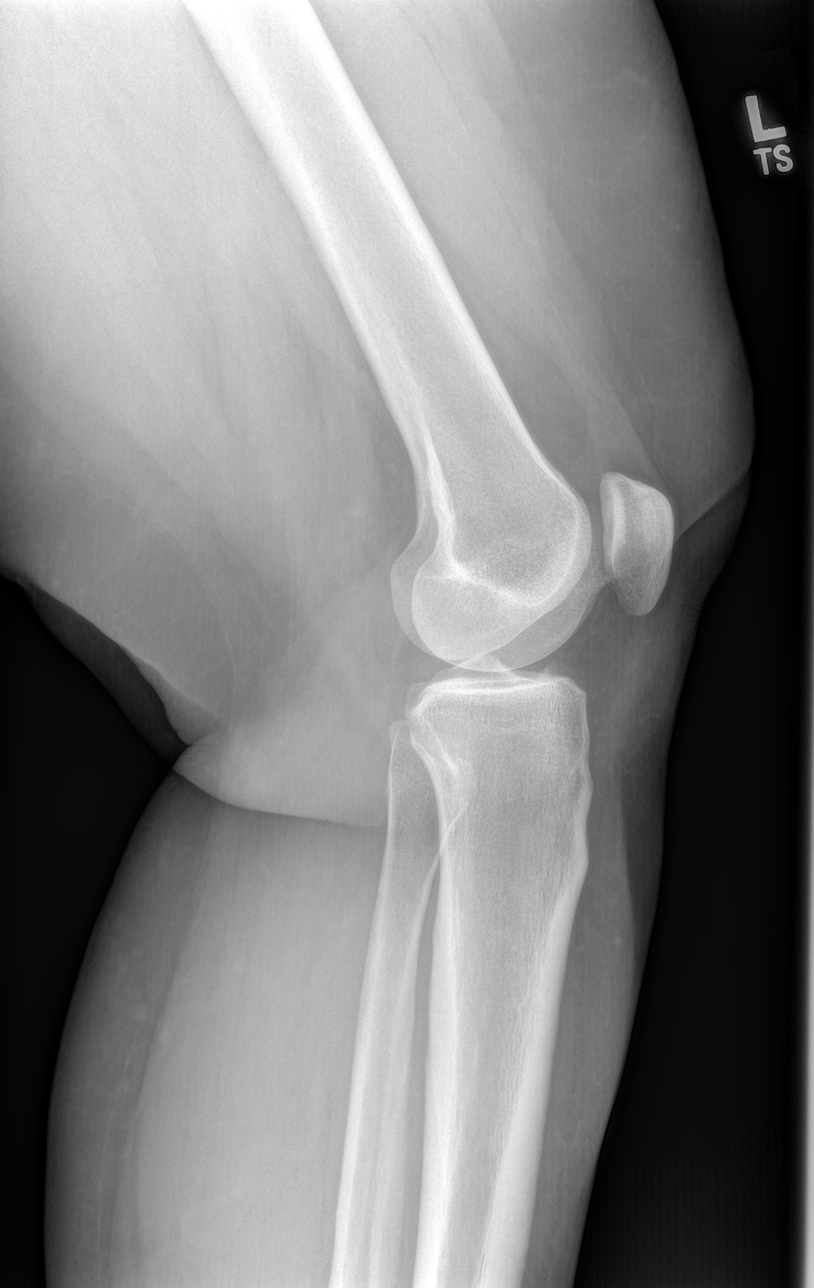
[im 2/4]
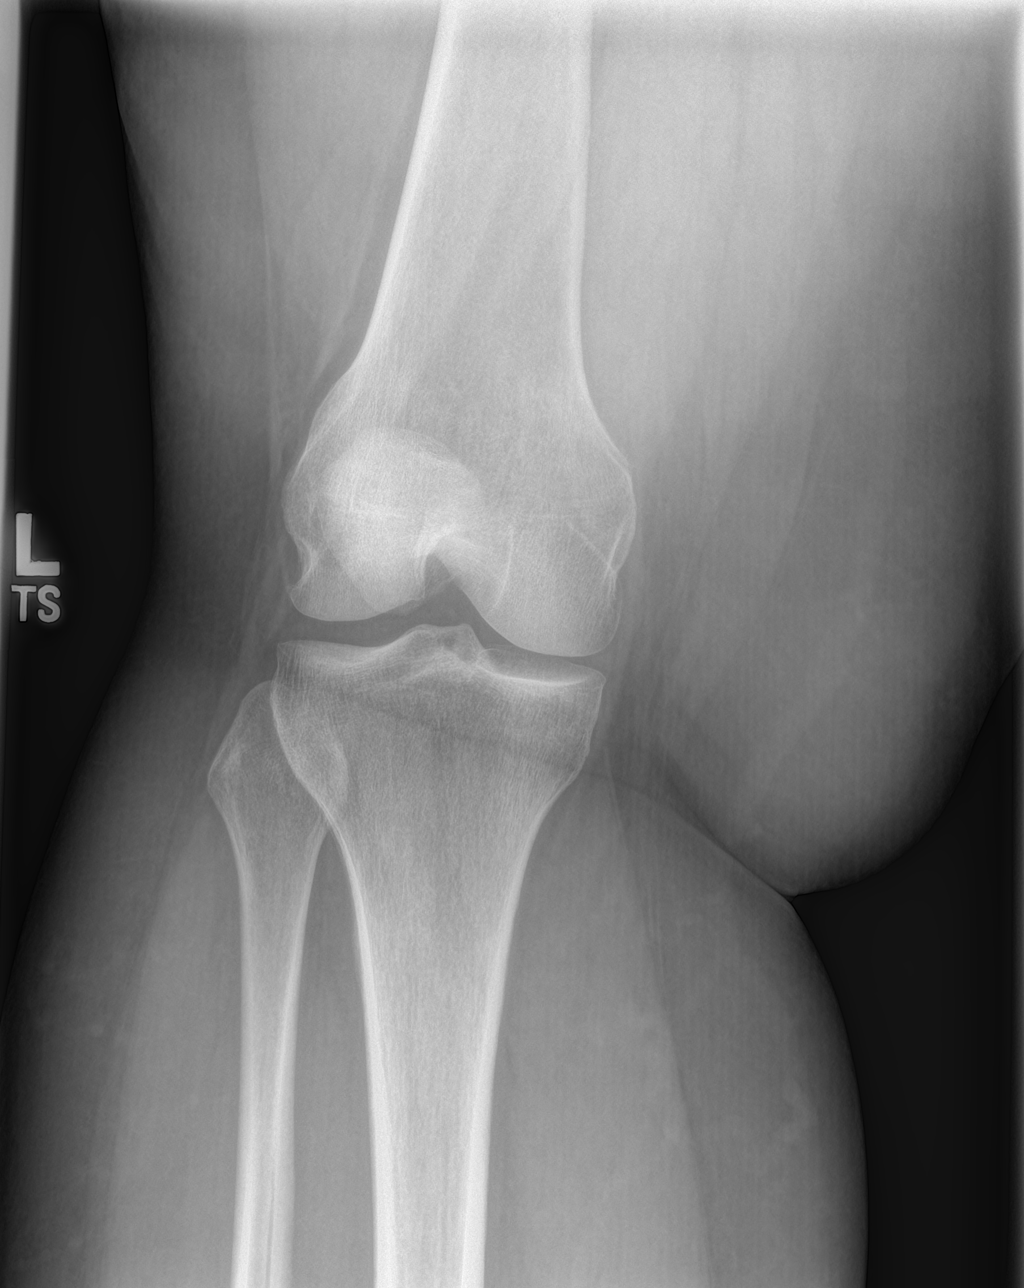
[im 3/4]
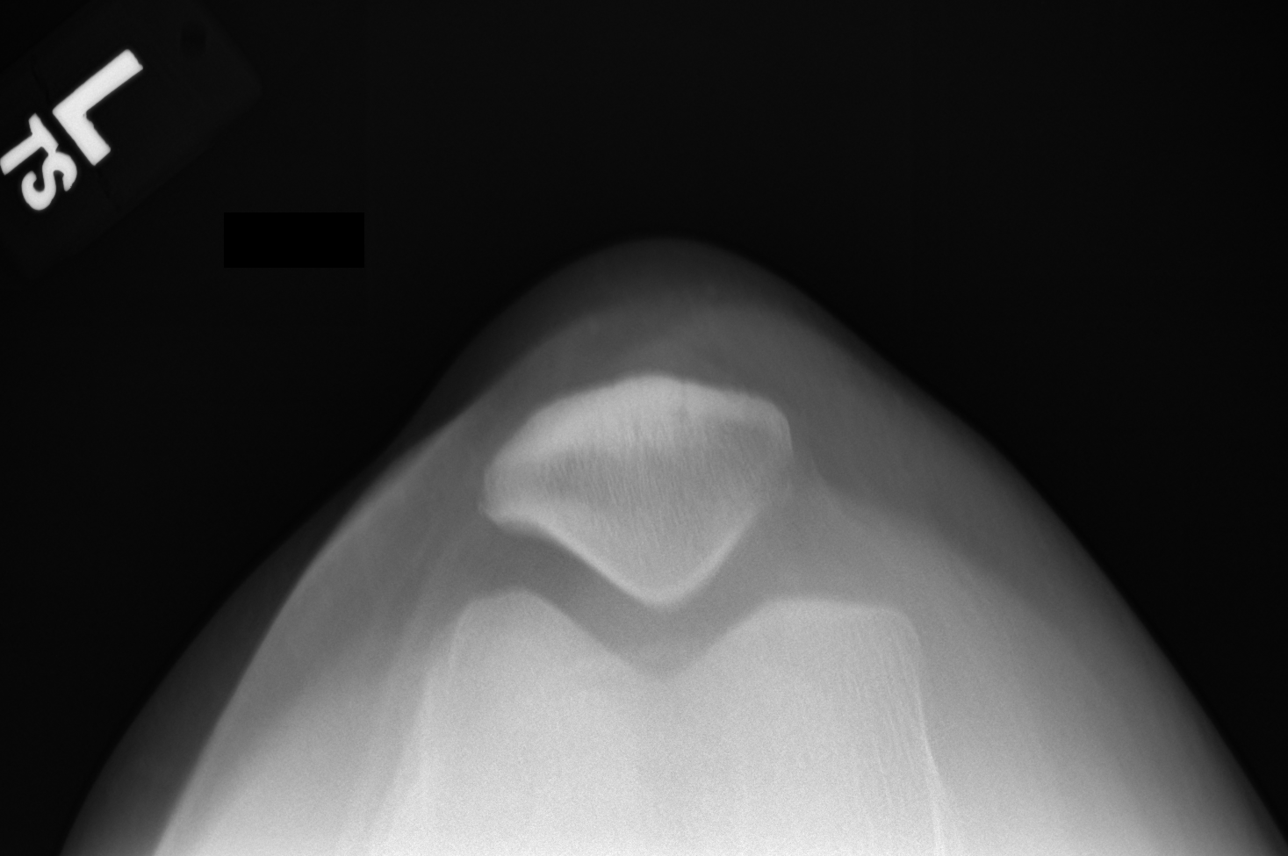
[im 4/4]
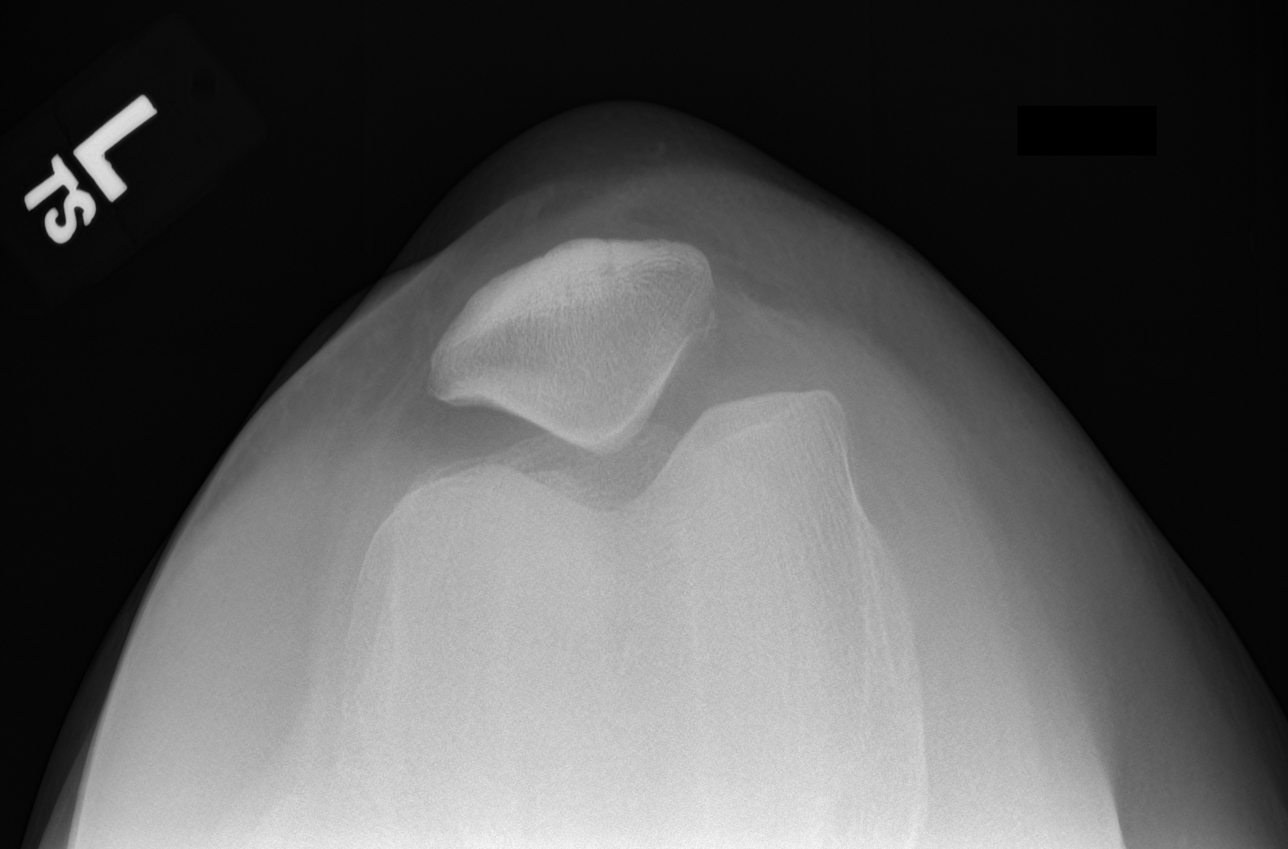

[4 of 4 positions shown; findings below may reference images not displayed]

FINDINGS: No evidence of fracture, dislocation, or joint effusion. No evidence
of arthropathy or other focal bone abnormality. Soft tissues are
unremarkable.
IMPRESSION: Negative.

## 2019-03-01 MED ORDER — ALBUTEROL SULFATE HFA 108 (90 BASE) MCG/ACT IN AERS: 2.0000 | INHALATION_SPRAY | RESPIRATORY_TRACT | 2 refills | Status: DC | PRN

## 2019-03-01 MED ORDER — LORATADINE 10 MG PO TABS: 10.0000 mg | ORAL_TABLET | Freq: Every day | ORAL | 11 refills | Status: DC

## 2019-03-01 MED ORDER — AEROCHAMBER PLUS MISC: 2 refills | Status: DC

## 2019-03-01 MED ORDER — FLUTICASONE PROPIONATE HFA 220 MCG/ACT IN AERO: 2.0000 | INHALATION_SPRAY | Freq: Two times a day (BID) | RESPIRATORY_TRACT | 12 refills | Status: DC

## 2019-03-01 NOTE — Assessment & Plan Note (Signed)
Will have patient start claritin to see if better control of allergies helps with asthma symptoms

## 2019-03-01 NOTE — Progress Notes (Signed)
LMP 02/28/2019 (Approximate)    Subjective:    Patient ID: Tara Shea, female    DOB: [birth year], 38 y.o.   MRN: [id number]  HPI: Tara Shea is a 38 y.o. female  Chief Complaint  Patient presents with  . Asthma    Pt works in [place] and with mandated masking she is having a harder time breathing through the mask.    HPI Virtual Visit via Telephone (FaceTime did not work) Current COVID-19 pandemic; patient felt her symptoms could be addressed by phone, in-person visit offered, she did not feel that was necessary   Staff discussed the limitations, risks, security, and privacy concerns of performing an evaluation and management service by telephone and the availability of in-person appointments. Staff discussed with the patient that he/she may be responsible for charges related to this service. The patient expressed understanding and agreed to proceed.   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; asked her if we needed to move her, work with employee health or HR; 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, so having to use mask around the shift Inhaler is helping; only one time had to use nebulizer at home No fever, cough is not different from typical asthma; she is on her period, so has some nausea with her periods, with PCOS and endometriosis; no diarrhea Not taking anything for allergies She has  used flovent, advair, pulmicort, etc over her childhood; would like to start that   Depression screen [facility] 2/9 03/01/2019 08/02/2018  Decreased Interest 0 0  Down, Depressed, Hopeless 0 0  PHQ - 2 Score 0 0  Altered sleeping 0 -  Tired, decreased energy 0 -  Change in appetite 0 -  Feeling bad or failure about yourself  0 -  Trouble concentrating 0 -  Moving slowly or fidgety/restless 0 -  Suicidal thoughts 0 -  PHQ-9 Score 0 -  Difficult doing work/chores Not difficult at all -   Fall Risk  03/01/2019 08/02/2018  Falls in the past year? 1 No  Number falls in past yr: 0 -  Injury with Fall? 0 -    Relevant past medical, surgical, family and social history reviewed Past Medical History:  Diagnosis Date  . Asthma   . Bone spur   . DVT (deep venous thrombosis) ([place])   . Factor 5 Leiden mutation, heterozygous ([place])   . Fibroid   . Migraines   . PCOS (polycystic ovarian syndrome)    Past Surgical History:  Procedure Laterality Date  . MYOMECTOMY    . TONSILLECTOMY    . WISDOM TOOTH EXTRACTION    . WRIST SURGERY     age 66 - rebroke it b/c it grew back wrong   Family History  Problem Relation Age of Onset  . Heart murmur Mother   . Thyroid disease Mother   . Migraines Mother   . Asthma  Mother   . Diabetes Mother        TYPE 2  . Bipolar disorder Mother   . Breast cancer Maternal Aunt 49  . Cancer Maternal Uncle        skin  . Cancer Maternal Grandmother 62       colon  . Hypertension Maternal Grandmother   . Cancer Maternal Aunt 50       colon  . Diabetes Maternal Aunt    Social History   Tobacco Use  . Smoking status: Never Smoker  . Smokeless tobacco: Never Used  Substance Use Topics  . Alcohol use: Yes    Comment: occ  . Drug use: No     Office Visit from 03/01/2019 in [facility]  AUDIT-C Score  1      Interim medical history since last visit reviewed. Allergies and medications reviewed  Review of Systems Per HPI unless  specifically indicated above     Objective:    LMP 02/28/2019 (Approximate)   Wt Readings from Last 3 Encounters:  11/28/18 (!) 310 lb (140.6 kg)  08/02/18 (!) 307 lb 1.6 oz (139.3 kg)  06/29/18 (!) 303 lb 12 oz (137.8 kg)    Physical Exam Pulmonary:     Effort: No tachypnea or respiratory distress.     Comments: Able to speak in full sentences without pausing for breath, no audible wheezing Neurological:     Mental Status: She is alert.     Cranial Nerves: No dysarthria.  Psychiatric:        Speech: Speech is not rapid and pressured, delayed or slurred.        Assessment & Plan:   Problem List Items Addressed This Visit      Respiratory   Asthma, mild intermittent    Having symptoms due to needing to wear mask all shift; will start flovent, she opts for the 220 strength; refilled her rescue inhaler as well; aerochamber with flow signal prescribed; encouraged use of claritin to help control allergy component; offered referral to pulm, and I can also work with HR or employee health if needed; she wants to try the medicine first; discussed singulair as an option, but there is a black box warning, so we'll only use that if this doesn't work      Relevant Medications   albuterol (PROVENTIL HFA;VENTOLIN HFA) 108 (90 Base) MCG/ACT inhaler   fluticasone (FLOVENT HFA) 220 MCG/ACT inhaler   Allergic rhinitis    Will have patient start claritin to see if better control of allergies helps with asthma symptoms          Follow up plan: No follow-ups on file.  An after-visit summary was printed and given to the patient at [place].  Please see the patient instructions which may contain other information and recommendations beyond what is mentioned above in the assessment and plan.  Meds ordered this encounter  Medications  . albuterol (PROVENTIL HFA;VENTOLIN HFA) 108 (90 Base) MCG/ACT inhaler    Sig: Inhale 2 puffs into the lungs every 4 (four) hours as needed for wheezing or  shortness of breath.    Dispense:  1 Inhaler    Refill:  2  . Spacer/Aero-Holding Chambers (AEROCHAMBER PLUS) inhaler    Sig: Use as instructed -- with flow signal    Dispense:  1 each    Refill:  2  . fluticasone (FLOVENT HFA) 220 MCG/ACT inhaler    Sig: Inhale 2 puffs into the lungs 2 (two) times  daily.    Dispense:  1 Inhaler    Refill:  12  . loratadine (CLARITIN) 10 MG tablet    Sig: Take 1 tablet (10 mg total) by mouth daily.    Dispense:  30 tablet    Refill:  11    No orders of the defined types were placed in this encounter.

## 2019-03-01 NOTE — Assessment & Plan Note (Signed)
Having symptoms due to needing to wear mask all shift; will start flovent, she opts for the 220 strength; refilled her rescue inhaler as well; aerochamber with flow signal prescribed; encouraged use of claritin to help control allergy component; offered referral to pulm, and I can also work with HR or employee health if needed; she wants to try the medicine first; discussed singulair as an option, but there is a black box warning, so we'll only use that if this doesn't work

## 2019-03-22 ENCOUNTER — Other Ambulatory Visit: Payer: Self-pay

## 2019-03-22 ENCOUNTER — Ambulatory Visit (INDEPENDENT_AMBULATORY_CARE_PROVIDER_SITE_OTHER): Payer: 59 | Admitting: Nurse Practitioner

## 2019-03-22 ENCOUNTER — Encounter: Payer: Self-pay | Admitting: Nurse Practitioner

## 2019-03-22 DIAGNOSIS — J4520 Mild intermittent asthma, uncomplicated: Secondary | ICD-10-CM

## 2019-03-22 DIAGNOSIS — R3915 Urgency of urination: Secondary | ICD-10-CM | POA: Diagnosis not present

## 2019-03-22 DIAGNOSIS — J302 Other seasonal allergic rhinitis: Secondary | ICD-10-CM

## 2019-03-22 LAB — POCT URINALYSIS DIPSTICK
Glucose, UA: NEGATIVE
Ketones, UA: NEGATIVE
Leukocytes, UA: NEGATIVE
Nitrite, UA: NEGATIVE
Protein, UA: NEGATIVE
Spec Grav, UA: 1.015 (ref 1.010–1.025)
Urobilinogen, UA: NEGATIVE E.U./dL — AB
pH, UA: 5 (ref 5.0–8.0)

## 2019-03-22 MED ORDER — MONTELUKAST SODIUM 10 MG PO TABS: 10.0000 mg | ORAL_TABLET | Freq: Every day | ORAL | 3 refills | Status: DC

## 2019-03-22 NOTE — Progress Notes (Signed)
Virtual Visit via Video Note  I connected with Tara Shea  on 03/22/19 at 10:40 AM EDT by a video enabled telemedicine application and verified that I am speaking with the correct person using two identifiers.   Staff discussed the limitations of evaluation and management by telemedicine and the availability of in person appointments. The patient expressed understanding and agreed to proceed.  Patient location: home  My location: Home office Other people present:  none HPI  Patient presents for three week follow up on asthma and seasonal allergies.  Patient states overall with the new fluticasone daily maintenance inhaler she has been doing really well.  She states she did have one episode where she needed to use her albuterol inhaler when she was at work.  This was after she had to wear an N 95 mask and was having trouble breathing through it.  After she took her albuterol inhaler she was back to baseline.  Patient additionally notes a lot of postnasal drainage and mild sinus congestion typical of her seasonal allergies.  Patient states when she takes her Claritin it provides moderate relief but still has some allergy symptoms would like additional medication.   Patient additionally endorses urinary urgency, but no frequency or dysuria. Doesn't drink much water. Does not drink much caffeine. Feels like UTI coming on.   PHQ2/9: Depression screen [facility] 2/9 03/22/2019 03/01/2019 08/02/2018  Decreased Interest 0 0 0  Down, Depressed, Hopeless 0 0 0  PHQ - 2 Score 0 0 0  Altered sleeping 0 0 -  Tired, decreased energy 0 0 -  Change in appetite 0 0 -  Feeling bad or failure about yourself  0 0 -  Trouble concentrating 0 0 -  Moving slowly or fidgety/restless 0 0 -  Suicidal thoughts 0 0 -  PHQ-9 Score 0 0 -  Difficult doing work/chores Not difficult at all Not difficult at all -    PHQ reviewed. Negative  Patient Active Problem List   Diagnosis Date Noted  . Allergic rhinitis  03/01/2019  . Kidney stone 08/02/2018  . Asthma, mild intermittent 08/02/2018  . Migraine headache with aura 08/02/2018  . Morbid obesity ([place]) 08/02/2018    Past Medical History:  Diagnosis Date  . Asthma   . Bone spur   . DVT (deep venous thrombosis) ([place])   . Factor 5 Leiden mutation, heterozygous ([place])   . Fibroid   . Migraines   . PCOS (polycystic ovarian syndrome)     Past Surgical History:  Procedure Laterality Date  . MYOMECTOMY    . TONSILLECTOMY    . WISDOM TOOTH EXTRACTION    . WRIST SURGERY     age 69 - rebroke it b/c it grew back wrong    Social History   Tobacco Use  . Smoking status: Never Smoker  . Smokeless tobacco: Never Used  Substance Use Topics  . Alcohol use: Yes    Comment: occ     Current Outpatient Medications:  .  albuterol (PROVENTIL HFA;VENTOLIN HFA) 108 (90 Base) MCG/ACT inhaler, Inhale 2 puffs into the lungs every 4 (four) hours as needed for wheezing or shortness of breath., Disp: 1 Inhaler, Rfl: 2 .  albuterol (PROVENTIL) (2.5 MG/3ML) 0.083% nebulizer solution, Take 3 mLs (2.5 mg total) by nebulization every 6 (six) hours as needed for wheezing or shortness of breath., Disp: 75 mL, Rfl: 12 .  fluticasone (FLOVENT HFA) 220 MCG/ACT inhaler, Inhale 2 puffs into the lungs 2 (two) times daily., Disp: 1  Inhaler, Rfl: 12 .  loratadine (CLARITIN) 10 MG tablet, Take 1 tablet (10 mg total) by mouth daily., Disp: 30 tablet, Rfl: 11 .  ondansetron (ZOFRAN ODT) 4 MG disintegrating tablet, Take 1 tablet (4 mg total) by mouth every 6 (six) hours as needed for nausea., Disp: 20 tablet, Rfl: 3 .  promethazine (PHENERGAN) 25 MG tablet, Take 25 mg by mouth as needed for nausea or vomiting., Disp: , Rfl:  .  ketorolac (TORADOL) 10 MG tablet, Take 1 tablet (10 mg total) by mouth every 6 (six) hours as needed. (Patient not taking: Reported on 03/22/2019), Disp: 20 tablet, Rfl: 0 .  Spacer/Aero-Holding Chambers (AEROCHAMBER PLUS) inhaler, Use as instructed, Disp:  1 each, Rfl: 2 .  Spacer/Aero-Holding Chambers (AEROCHAMBER PLUS) inhaler, Use as instructed -- with flow signal, Disp: 1 each, Rfl: 2 .  SUMAtriptan (IMITREX) 20 MG/ACT nasal spray, Place 20 mg into the nose every 2 (two) hours as needed for migraine or headache. May repeat in 2 hours if headache persists or recurs., Disp: , Rfl:   No Known Allergies  ROS   No other specific complaints in a complete review of systems (except as listed in HPI above).  Objective  There were no vitals filed for this visit.   There is no height or weight on file to calculate BMI.  Nursing Note and Vital Signs reviewed.  Physical Exam  Constitutional: Patient appears well-developed and well-nourished. No distress.  HENT: Head: Normocephalic and atraumatic. Pulmonary/Chest: Effort normal  Musculoskeletal: Normal range of motion,  Neurological:  alert and oriented to person, place, and time. speech is normal  Skin: No rash noted. No erythema.  Psychiatric: Patient has a normal mood and affect. behavior is normal. Judgment and thought content normal.    Assessment & Plan  1. Mild intermittent asthma without complication Much improved continue daily inhaler, and albuterol PRN  2. Seasonal allergies Take claritin daily  - montelukast (SINGULAIR) 10 MG tablet; Take 1 tablet (10 mg total) by mouth at bedtime.  Dispense: 30 tablet; Refill: 3  3. Urinary urgency - POCT Urinalysis Dipstick POC negative for leukocytes, positive for blood. Will add on UA microscopic . Patient states she doesn't have back or flank pain, not on her period.   Follow Up Instructions:   PRN, patient will call to schedule physical  I discussed the assessment and treatment plan with the patient. The patient was provided an opportunity to ask questions and all were answered. The patient agreed with the plan and demonstrated an understanding of the instructions.   The patient was advised to call back or seek an in-person  evaluation if the symptoms worsen or if the condition fails to improve as anticipated.  I provided 12 minutes of non-face-to-face time during this encounter.   Fredderick Severance, NP

## 2019-03-23 LAB — URINALYSIS, ROUTINE W REFLEX MICROSCOPIC
Bilirubin Urine: NEGATIVE
Glucose, UA: NEGATIVE
Hgb urine dipstick: NEGATIVE
Ketones, ur: NEGATIVE
Leukocytes,Ua: NEGATIVE
Nitrite: NEGATIVE
Protein, ur: NEGATIVE
Specific Gravity, Urine: 1.023 (ref 1.001–1.03)
pH: <=5 (ref 5.0–8.0)

## 2019-03-24 DIAGNOSIS — I82409 Acute embolism and thrombosis of unspecified deep veins of unspecified lower extremity: Secondary | ICD-10-CM

## 2019-03-24 HISTORY — DX: Acute embolism and thrombosis of unspecified deep veins of unspecified lower extremity: I82.409

## 2019-04-07 ENCOUNTER — Other Ambulatory Visit: Payer: Self-pay | Admitting: Family Medicine

## 2019-04-07 ENCOUNTER — Ambulatory Visit (INDEPENDENT_AMBULATORY_CARE_PROVIDER_SITE_OTHER): Payer: 59 | Admitting: Family Medicine

## 2019-04-07 ENCOUNTER — Other Ambulatory Visit: Payer: Self-pay

## 2019-04-07 ENCOUNTER — Ambulatory Visit
Admission: RE | Admit: 2019-04-07 | Discharge: 2019-04-07 | Disposition: A | Discharge status: Home / Self Care | Payer: 59 | Source: Ambulatory Visit | Attending: Family Medicine | Admitting: Family Medicine

## 2019-04-07 ENCOUNTER — Encounter: Payer: Self-pay | Admitting: Family Medicine

## 2019-04-07 VITALS — BP 140/82 | HR 97 | Temp 98.0°F | Resp 16 | Ht 68.0 in | Wt 312.1 lb

## 2019-04-07 DIAGNOSIS — M79661 Pain in right lower leg: Secondary | ICD-10-CM

## 2019-04-07 DIAGNOSIS — Z86718 Personal history of other venous thrombosis and embolism: Secondary | ICD-10-CM | POA: Diagnosis not present

## 2019-04-07 DIAGNOSIS — D6851 Activated protein C resistance: Secondary | ICD-10-CM | POA: Diagnosis not present

## 2019-04-07 DIAGNOSIS — I82441 Acute embolism and thrombosis of right tibial vein: Secondary | ICD-10-CM

## 2019-04-07 MED ORDER — RIVAROXABAN (XARELTO) VTE STARTER PACK (15 & 20 MG): ORAL_TABLET | ORAL | 0 refills | Status: DC

## 2019-04-07 NOTE — Progress Notes (Signed)
Name: Tara Shea   MRN: [id number]    DOB: 15-Oct-1981   Date:04/07/2019       Progress Note  Subjective  Chief Complaint  Chief Complaint  Patient presents with  . Leg Pain    right leg pain, sore, tight for 6 days. Hx of blood clots   HPI PT presents with concern for RIGHT posterior calf pain for about 6 days. She has factor 5 Leiden and has history of provoked DVT in the LEFT calf after traveling and taking OCP 8 years ago.  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  We will send for STAT US today for official reading. She denies recent travel or immobility, she is not on OCP, she is obese, she is a non-smoker.  Denies chest pain or shortness of breath.  Patient Active Problem List   Diagnosis Date Noted  . Allergic rhinitis 03/01/2019  . Kidney stone 08/02/2018  . Asthma, mild intermittent 08/02/2018  . Migraine headache with aura 08/02/2018  . Morbid obesity ([place]) 08/02/2018    Social History   Tobacco Use  . Smoking status: Never Smoker  . Smokeless tobacco: Never Used  Substance Use Topics  . Alcohol use: Yes    Comment: occ     Current Outpatient Medications:  .  albuterol (PROVENTIL HFA;VENTOLIN HFA) 108 (90 Base) MCG/ACT inhaler, Inhale 2 puffs into the lungs every 4 (four) hours as needed for wheezing or shortness of breath., Disp: 1 Inhaler, Rfl: 2 .  albuterol (PROVENTIL) (2.5 MG/3ML) 0.083% nebulizer solution, Take 3 mLs (2.5 mg total) by nebulization every 6 (six) hours as needed for wheezing or shortness of breath., Disp: 75 mL, Rfl: 12 .  fluticasone (FLOVENT HFA) 220 MCG/ACT inhaler, Inhale 2 puffs into the lungs 2 (two) times daily., Disp: 1 Inhaler, Rfl: 12 .  ketorolac (TORADOL) 10 MG tablet, Take 1 tablet (10 mg total) by mouth every 6 (six) hours as needed., Disp: 20 tablet, Rfl: 0 .  loratadine (CLARITIN) 10 MG tablet, Take 1 tablet (10 mg total) by mouth daily., Disp: 30  tablet, Rfl: 11 .  ondansetron (ZOFRAN ODT) 4 MG disintegrating tablet, Take 1 tablet (4 mg total) by mouth every 6 (six) hours as needed for nausea., Disp: 20 tablet, Rfl: 3 .  promethazine (PHENERGAN) 25 MG tablet, Take 25 mg by mouth as needed for nausea or vomiting., Disp: , Rfl:  .  Spacer/Aero-Holding Chambers (AEROCHAMBER PLUS) inhaler, Use as instructed, Disp: 1 each, Rfl: 2 .  Spacer/Aero-Holding Chambers (AEROCHAMBER PLUS) inhaler, Use as instructed -- with flow signal, Disp: 1 each, Rfl: 2 .  SUMAtriptan (IMITREX) 20 MG/ACT nasal spray, Place 20 mg into the nose every 2 (two) hours as needed for migraine or headache. May repeat in 2 hours if headache persists or recurs., Disp: , Rfl:  .  montelukast (SINGULAIR) 10 MG tablet, Take 1 tablet (10 mg total) by mouth at bedtime. (Patient not taking: Reported on 04/07/2019), Disp: 30 tablet, Rfl: 3  No Known Allergies  I personally reviewed active problem list, medication list, allergies, notes from last encounter, lab results with the patient/caregiver today.  ROS  Constitutional: Negative for fever or weight change.  Respiratory: Negative for cough and shortness of breath.   Cardiovascular: Negative for chest pain or palpitations.  Gastrointestinal: Negative for abdominal pain, no bowel changes.  Musculoskeletal: See HPI Skin: Negative for rash.  Neurological: Negative for dizziness  or headache.  No other specific complaints in a complete review of systems (except as listed in HPI above).  Objective  Vitals:   04/07/19 1035  BP: 140/82  Pulse: 97  Resp: 16  Temp: 98 F (36.7 C)  TempSrc: Oral  SpO2: 99%  Weight: (!) 312 lb 1.6 oz (141.6 kg)  Height: 5\' 8"  (1.727 m)    Body mass index is 47.45 kg/m.  Nursing Note and Vital Signs reviewed.  Physical Exam  Constitutional: Patient appears well-developed and well-nourished. No distress.  HENT: Head: Normocephalic and atraumatic. Eyes: Conjunctivae and EOM are normal. No  scleral icterus.  Pupils are equal, round, and reactive to light.  Neck: Normal range of motion. Neck supple. No JVD present. No thyromegaly present.  Cardiovascular: Normal rate, regular rhythm and normal heart sounds.  No murmur heard. No BLE edema. Pulmonary/Chest: Effort normal and breath sounds normal. No respiratory distress. Musculoskeletal: Normal range of motion, no joint effusions. The RIGHT calf is tender, mildly swollen with minimal erythema. Neurological: Pt is alert and oriented to person, place, and time. No cranial nerve deficit. Coordination, balance, strength, speech and gait are normal.  Skin: Skin is warm and dry. No rash noted. No erythema.  Psychiatric: Patient has a normal mood and affect. behavior is normal. Judgment and thought content normal.  No results found for this or any previous visit (from the past 72 hour(s)).  Assessment & Plan  1. Right calf pain - US Venous Img Lower Unilateral Right; Future  2. Factor 5 Leiden mutation, heterozygous ([place]) - US Venous Img Lower Unilateral Right; Future - If DVT confirmed, will consider referral to Hematology due to unprovoked nature of clot and hx Factor 5.  3. History of DVT of lower extremity - US Venous Img Lower Unilateral Right; Future  -Red flags and when to present for emergency care or RTC including fever >101.63F, chest pain, shortness of breath, new/worsening/un-resolving symptoms, reviewed with patient at time of visit. Follow up and care instructions discussed and provided in AVS.

## 2019-04-11 ENCOUNTER — Encounter: Payer: Self-pay | Admitting: Family Medicine

## 2019-04-12 ENCOUNTER — Other Ambulatory Visit: Payer: Self-pay

## 2019-04-13 ENCOUNTER — Inpatient Hospital Stay: Payer: 59 | Admitting: Internal Medicine

## 2019-04-18 ENCOUNTER — Other Ambulatory Visit: Payer: Self-pay

## 2019-04-18 ENCOUNTER — Inpatient Hospital Stay: Payer: 59 | Attending: Internal Medicine | Admitting: Internal Medicine

## 2019-04-18 DIAGNOSIS — I82461 Acute embolism and thrombosis of right calf muscular vein: Secondary | ICD-10-CM | POA: Diagnosis not present

## 2019-04-18 DIAGNOSIS — D6851 Activated protein C resistance: Secondary | ICD-10-CM | POA: Diagnosis not present

## 2019-04-18 DIAGNOSIS — Z7901 Long term (current) use of anticoagulants: Secondary | ICD-10-CM | POA: Diagnosis not present

## 2019-04-18 NOTE — Progress Notes (Signed)
I connected with Tara Shea on 04/18/19 at  2:30 PM EDT by video enabled telemedicine visit and verified that I am speaking with the correct person using two identifiers.  I discussed the limitations, risks, security and privacy concerns of performing an evaluation and management service by telemedicine and the availability of in-person appointments. I also discussed with the patient that there may be a patient responsible charge related to this service. The patient expressed understanding and agreed to proceed.    Other persons participating in the visit and their role in the encounter: None Patient's location: Home Provider's location: Home   No history exists.    #2011 December-left lower extremity DVT [Lovenox/Coumadin for 6 to 7 months]-birth control pills/long car ride; positive for heterozygous factor V Leiden [as per patient/no records available; [facility]]  #Apr 07, 2019-right calf DVT-small clot burden but symptomatic; no provoking factors  Chief Complaint: DVT   History of present illness:Tara Shea 38 y.o.  female with history of known heterozygous factor V Leiden and previous history of provoked DVT in 2011 has been referred to [place] for new blood clot in the right lower extremity.  Approximate 2 weeks ago patient noted to have cramping pain right lower extremity which is progressive gotten worse.  This led to further work-up including ultrasound of the right lower extremity there was found to have a right calf vein DVT small clot burden.  Patient was adequately started on Xarelto.  Patient's current symptoms are improved.  Denies any significant leg swelling at this time.  Patient does note to have a history of DVT in 2011 on the left lower extremity for which she was on anticoagulation for 6 months or so.  At that time it was thought to be provoked.  However work-up as per the patient at the time showed factor V Leiden heterozygosity.  Patient denies any  birth control pills at this time denies any long car rides or any other precipitating factors.   Patient is currently not pregnant however is planning pregnancy in the near future.  FHx: Mother negative for factor V Leiden.  Dad family history unknown.   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  No children  Observation/objective: Right lower extremity distal DVT as discussed below  Assessment and plan: Acute deep vein thrombosis (DVT) of calf muscle vein of right lower extremity # May 2020-Acute DVT of the right lower extremity-calf veins small clot burden-currently on Xarelto.  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  Previous DVT provoked/however currently May 2020 unprovoked.  Agree with current anticoagulation in the between 3 to 4 months-however further anticoagulation will need to be weighed in the context of her recurrent DVT/factor V Leiden heterozygosity.  Recurrent DVT/unprovoked nature suggestive of longer-term anticoagulation.  # planning for pregnancy-we will strictly recommend avoiding pregnancy while on Xarelto.  She will need to be transitioned to Lovenox.   #As per patient factor V [place] heterozygous- [[id number] [place] [id number]- Dr.Alicia Copeland].  We will try to get the records.  If unavailable then will order further work-up.  #Patient has been on Xarelto for about 10 days now; she is clinically asymptomatic.  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  She will call us if any refills need for her Xarelto.  # DISPOSITION: # follow up in 3 months- MD- Dr.B.   Follow-up instructions:  I discussed the assessment and treatment plan with the patient.  The patient was provided an opportunity to ask questions and all were answered.  The patient agreed with the plan and demonstrated understanding of instructions.  The patient was advised to call back or seek an in person evaluation if the symptoms  worsen or if the condition fails to improve as anticipated.  Dr. Charlaine Dalton [place] at [facility] 04/18/2019 3:33 PM

## 2019-04-18 NOTE — Assessment & Plan Note (Addendum)
#   May 2020-Acute DVT of the right lower extremity-calf veins small clot burden-currently on Xarelto.  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  Previous DVT provoked/however currently May 2020 unprovoked.  Agree with current anticoagulation in the between 3 to 4 months-however further anticoagulation will need to be weighed in the context of her recurrent DVT/factor V Leiden heterozygosity.  Recurrent DVT/unprovoked nature suggestive of longer-term anticoagulation.  # planning for pregnancy-we will strictly recommend avoiding pregnancy while on Xarelto.  She will need to be transitioned to Lovenox.   #As per patient factor V Leyden heterozygous- [[id number]- Dr.Alicia Copeland].  We will try to get the records.  If unavailable then will order further work-up.  #Patient has been on Xarelto for about 10 days now; she is clinically asymptomatic.  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  She will call us if any refills need for her Xarelto.  # DISPOSITION: # follow up in 3 months- MD- Dr.B.

## 2019-04-19 ENCOUNTER — Encounter: Payer: Self-pay | Admitting: *Deleted

## 2019-04-29 ENCOUNTER — Other Ambulatory Visit: Payer: Self-pay | Admitting: Family Medicine

## 2019-04-29 DIAGNOSIS — I82441 Acute embolism and thrombosis of right tibial vein: Secondary | ICD-10-CM

## 2019-05-02 ENCOUNTER — Other Ambulatory Visit: Payer: Self-pay | Admitting: Family Medicine

## 2019-05-02 DIAGNOSIS — I82441 Acute embolism and thrombosis of right tibial vein: Secondary | ICD-10-CM

## 2019-05-03 NOTE — Telephone Encounter (Signed)
Pt is wanting to know if this will be refilled or is she needs to get this from the hematologist

## 2019-05-04 ENCOUNTER — Other Ambulatory Visit: Payer: Self-pay | Admitting: Internal Medicine

## 2019-05-04 ENCOUNTER — Encounter: Payer: Self-pay | Admitting: Internal Medicine

## 2019-05-04 MED ORDER — RIVAROXABAN 20 MG PO TABS: 20.0000 mg | ORAL_TABLET | Freq: Every day | ORAL | 3 refills | Status: DC

## 2019-05-04 NOTE — Progress Notes (Signed)
[place] to refill xarelto-20 mg/day. Order pended; please send to pt's pharmacy of choice.

## 2019-05-04 NOTE — Progress Notes (Signed)
Prescription sent to pt's pharmacy. Will send patient mychart msg to inform patient that script was sent

## 2019-05-04 NOTE — Telephone Encounter (Signed)
Please have patient obtain refill of Xarelto from Dr. Rogue Bussing as he has taken over care for DVT.  If she can't get refill from him, please let me know as I do not want her to miss a dose.

## 2019-05-04 NOTE — Telephone Encounter (Signed)
Called patient. LVM regarding Xarelto refills.

## 2019-05-04 NOTE — Telephone Encounter (Signed)
As per my prior note, should be from hematologist, but if she is going to be out soon, I will definitely refill for her.  Just let me know either way.

## 2019-05-04 NOTE — Telephone Encounter (Signed)
Called patient and she stated hematology refilled it today.

## 2019-06-15 ENCOUNTER — Encounter: Payer: Self-pay | Admitting: Internal Medicine

## 2019-06-16 ENCOUNTER — Other Ambulatory Visit: Payer: Self-pay | Admitting: *Deleted

## 2019-06-16 DIAGNOSIS — I82461 Acute embolism and thrombosis of right calf muscular vein: Secondary | ICD-10-CM

## 2019-06-20 ENCOUNTER — Inpatient Hospital Stay: Payer: 59 | Attending: Internal Medicine

## 2019-06-20 ENCOUNTER — Other Ambulatory Visit: Payer: Self-pay

## 2019-06-20 DIAGNOSIS — I82461 Acute embolism and thrombosis of right calf muscular vein: Secondary | ICD-10-CM | POA: Insufficient documentation

## 2019-06-20 DIAGNOSIS — Z7901 Long term (current) use of anticoagulants: Secondary | ICD-10-CM | POA: Diagnosis not present

## 2019-06-20 DIAGNOSIS — D6851 Activated protein C resistance: Secondary | ICD-10-CM | POA: Insufficient documentation

## 2019-06-20 LAB — CBC WITH DIFFERENTIAL/PLATELET
Abs Immature Granulocytes: 0.01 10*3/uL (ref 0.00–0.07)
Basophils Absolute: 0 10*3/uL (ref 0.0–0.1)
Basophils Relative: 0 %
Eosinophils Absolute: 0.2 10*3/uL (ref 0.0–0.5)
Eosinophils Relative: 3 %
HCT: 25.4 % — ABNORMAL LOW (ref 36.0–46.0)
Hemoglobin: 7.6 g/dL — ABNORMAL LOW (ref 12.0–15.0)
Immature Granulocytes: 0 %
Lymphocytes Relative: 48 %
Lymphs Abs: 2.7 10*3/uL (ref 0.7–4.0)
MCH: 22.6 pg — ABNORMAL LOW (ref 26.0–34.0)
MCHC: 29.9 g/dL — ABNORMAL LOW (ref 30.0–36.0)
MCV: 75.6 fL — ABNORMAL LOW (ref 80.0–100.0)
Monocytes Absolute: 0.4 10*3/uL (ref 0.1–1.0)
Monocytes Relative: 7 %
Neutro Abs: 2.4 10*3/uL (ref 1.7–7.7)
Neutrophils Relative %: 42 %
Platelets: 298 10*3/uL (ref 150–400)
RBC: 3.36 MIL/uL — ABNORMAL LOW (ref 3.87–5.11)
RDW: 14.3 % (ref 11.5–15.5)
WBC: 5.7 10*3/uL (ref 4.0–10.5)
nRBC: 0 % (ref 0.0–0.2)

## 2019-06-22 ENCOUNTER — Other Ambulatory Visit: Payer: Self-pay | Admitting: Internal Medicine

## 2019-06-22 DIAGNOSIS — D500 Iron deficiency anemia secondary to blood loss (chronic): Secondary | ICD-10-CM

## 2019-06-22 NOTE — Progress Notes (Signed)
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  Heavy menstrual cycles; feeling extremely tired symptomatic/hemoglobin 7.6.  Discussed regarding p.o. iron/IV iron.  Patient interested in receiving IV iron.   Discussed the potential acute infusion reactions with IV iron; which are quite rare.  Patient understands the risk; will proceed with infusions. -------------------------------------------------------------------------------------------------------------------   Heather/Colette-please set up for IV Feraheme infusion weekly x3; starting next week  # please keep appt as planned on aug 26th with but ADD labs- cbc/cmp/LDh- possible Kirtland Bouchard

## 2019-06-23 ENCOUNTER — Other Ambulatory Visit: Payer: Self-pay | Admitting: Internal Medicine

## 2019-06-23 NOTE — Progress Notes (Signed)
I switched Feraheme to Venofer as per insurance.  Please add possible Venofer when she sees me on 8/26

## 2019-06-23 NOTE — Addendum Note (Signed)
Addended by: Sabino Gasser on: 06/23/2019 09:01 AM   Modules accepted: Orders

## 2019-06-26 ENCOUNTER — Encounter: Payer: Self-pay | Admitting: Internal Medicine

## 2019-06-29 ENCOUNTER — Telehealth: Payer: Self-pay | Admitting: Internal Medicine

## 2019-06-29 ENCOUNTER — Other Ambulatory Visit: Payer: Self-pay

## 2019-06-29 NOTE — Telephone Encounter (Signed)
Reviewed the records from [facility] from May 2012 no work-up available regarding hypercoagulable state

## 2019-06-30 ENCOUNTER — Other Ambulatory Visit: Payer: Self-pay

## 2019-06-30 ENCOUNTER — Inpatient Hospital Stay: Payer: 59 | Attending: Internal Medicine

## 2019-06-30 VITALS — BP 129/84 | HR 88 | Temp 98.5°F | Resp 20

## 2019-06-30 DIAGNOSIS — D649 Anemia, unspecified: Secondary | ICD-10-CM | POA: Diagnosis not present

## 2019-06-30 DIAGNOSIS — D500 Iron deficiency anemia secondary to blood loss (chronic): Secondary | ICD-10-CM

## 2019-06-30 DIAGNOSIS — Z7901 Long term (current) use of anticoagulants: Secondary | ICD-10-CM | POA: Diagnosis not present

## 2019-06-30 DIAGNOSIS — Z86718 Personal history of other venous thrombosis and embolism: Secondary | ICD-10-CM | POA: Diagnosis not present

## 2019-06-30 DIAGNOSIS — D6859 Other primary thrombophilia: Secondary | ICD-10-CM | POA: Insufficient documentation

## 2019-06-30 MED ORDER — SODIUM CHLORIDE 0.9 % IV SOLN
Freq: Once | INTRAVENOUS | Status: AC
  Administered 2019-06-30: 09:00:00 via INTRAVENOUS
  Filled 2019-06-30: qty 250

## 2019-06-30 MED ORDER — IRON SUCROSE 20 MG/ML IV SOLN
200.0000 mg | Freq: Once | INTRAVENOUS | Status: AC
  Administered 2019-06-30: 200 mg via INTRAVENOUS
  Filled 2019-06-30: qty 10

## 2019-07-04 ENCOUNTER — Other Ambulatory Visit: Payer: Self-pay

## 2019-07-05 ENCOUNTER — Other Ambulatory Visit: Payer: Self-pay

## 2019-07-05 ENCOUNTER — Inpatient Hospital Stay: Payer: 59

## 2019-07-05 ENCOUNTER — Encounter: Payer: Self-pay | Admitting: *Deleted

## 2019-07-05 VITALS — BP 136/64 | HR 93 | Temp 97.5°F | Resp 20

## 2019-07-05 DIAGNOSIS — D6859 Other primary thrombophilia: Secondary | ICD-10-CM | POA: Diagnosis not present

## 2019-07-05 DIAGNOSIS — Z86718 Personal history of other venous thrombosis and embolism: Secondary | ICD-10-CM | POA: Diagnosis not present

## 2019-07-05 DIAGNOSIS — Z7901 Long term (current) use of anticoagulants: Secondary | ICD-10-CM | POA: Diagnosis not present

## 2019-07-05 DIAGNOSIS — D500 Iron deficiency anemia secondary to blood loss (chronic): Secondary | ICD-10-CM

## 2019-07-05 DIAGNOSIS — D649 Anemia, unspecified: Secondary | ICD-10-CM | POA: Diagnosis not present

## 2019-07-05 MED ORDER — IRON SUCROSE 20 MG/ML IV SOLN
200.0000 mg | Freq: Once | INTRAVENOUS | Status: AC
  Administered 2019-07-05: 200 mg via INTRAVENOUS
  Filled 2019-07-05: qty 10

## 2019-07-05 MED ORDER — SODIUM CHLORIDE 0.9 % IV SOLN
Freq: Once | INTRAVENOUS | Status: AC
  Administered 2019-07-05: 14:00:00 via INTRAVENOUS
  Filled 2019-07-05: qty 250

## 2019-07-06 ENCOUNTER — Telehealth: Payer: Self-pay | Admitting: *Deleted

## 2019-07-06 NOTE — Telephone Encounter (Signed)
Patient's fmla refaxed - to [facility]. 1 [phone]

## 2019-07-12 ENCOUNTER — Inpatient Hospital Stay: Payer: 59

## 2019-07-12 ENCOUNTER — Other Ambulatory Visit: Payer: Self-pay

## 2019-07-12 VITALS — BP 120/80 | HR 78 | Temp 97.0°F

## 2019-07-12 DIAGNOSIS — Z86718 Personal history of other venous thrombosis and embolism: Secondary | ICD-10-CM | POA: Diagnosis not present

## 2019-07-12 DIAGNOSIS — D500 Iron deficiency anemia secondary to blood loss (chronic): Secondary | ICD-10-CM

## 2019-07-12 DIAGNOSIS — D649 Anemia, unspecified: Secondary | ICD-10-CM | POA: Diagnosis not present

## 2019-07-12 DIAGNOSIS — Z7901 Long term (current) use of anticoagulants: Secondary | ICD-10-CM | POA: Diagnosis not present

## 2019-07-12 DIAGNOSIS — D6859 Other primary thrombophilia: Secondary | ICD-10-CM | POA: Diagnosis not present

## 2019-07-12 MED ORDER — SODIUM CHLORIDE 0.9 % IV SOLN
Freq: Once | INTRAVENOUS | Status: AC
  Administered 2019-07-12: 15:00:00 via INTRAVENOUS
  Filled 2019-07-12: qty 250

## 2019-07-12 MED ORDER — IRON SUCROSE 20 MG/ML IV SOLN
200.0000 mg | Freq: Once | INTRAVENOUS | Status: AC
  Administered 2019-07-12: 200 mg via INTRAVENOUS
  Filled 2019-07-12: qty 10

## 2019-07-13 ENCOUNTER — Encounter: Payer: Self-pay | Admitting: Internal Medicine

## 2019-07-16 ENCOUNTER — Telehealth: Payer: Self-pay | Admitting: Internal Medicine

## 2019-07-16 NOTE — Telephone Encounter (Signed)
Returned patient's phone call left a message to call us back to discuss her concerns about lab work.   #Please order CBC at next MD visit/also schedule for possible Feraheme infusion.

## 2019-07-17 ENCOUNTER — Encounter: Payer: Self-pay | Admitting: *Deleted

## 2019-07-17 NOTE — Telephone Encounter (Signed)
My chart msg sent to patient. Unable to add iron infusion to this week's schedule due to availability. Contacted patient to best coordinate her appointments.

## 2019-07-18 ENCOUNTER — Other Ambulatory Visit: Payer: Self-pay

## 2019-07-19 ENCOUNTER — Other Ambulatory Visit: Payer: Self-pay | Admitting: *Deleted

## 2019-07-19 ENCOUNTER — Encounter: Payer: Self-pay | Admitting: Internal Medicine

## 2019-07-19 ENCOUNTER — Inpatient Hospital Stay (HOSPITAL_BASED_OUTPATIENT_CLINIC_OR_DEPARTMENT_OTHER): Payer: 59 | Admitting: Internal Medicine

## 2019-07-19 ENCOUNTER — Inpatient Hospital Stay: Payer: 59

## 2019-07-19 ENCOUNTER — Other Ambulatory Visit: Payer: Self-pay

## 2019-07-19 VITALS — BP 122/83 | HR 83 | Temp 96.7°F | Resp 20 | Ht 68.0 in | Wt 315.0 lb

## 2019-07-19 DIAGNOSIS — D500 Iron deficiency anemia secondary to blood loss (chronic): Secondary | ICD-10-CM

## 2019-07-19 DIAGNOSIS — Z86718 Personal history of other venous thrombosis and embolism: Secondary | ICD-10-CM | POA: Diagnosis not present

## 2019-07-19 DIAGNOSIS — Z7901 Long term (current) use of anticoagulants: Secondary | ICD-10-CM | POA: Diagnosis not present

## 2019-07-19 DIAGNOSIS — I82461 Acute embolism and thrombosis of right calf muscular vein: Secondary | ICD-10-CM

## 2019-07-19 DIAGNOSIS — D649 Anemia, unspecified: Secondary | ICD-10-CM | POA: Diagnosis not present

## 2019-07-19 DIAGNOSIS — D6859 Other primary thrombophilia: Secondary | ICD-10-CM | POA: Diagnosis not present

## 2019-07-19 LAB — CBC WITH DIFFERENTIAL/PLATELET
Abs Immature Granulocytes: 0.01 10*3/uL (ref 0.00–0.07)
Basophils Absolute: 0 10*3/uL (ref 0.0–0.1)
Basophils Relative: 1 %
Eosinophils Absolute: 0.2 10*3/uL (ref 0.0–0.5)
Eosinophils Relative: 4 %
HCT: 34.7 % — ABNORMAL LOW (ref 36.0–46.0)
Hemoglobin: 10.4 g/dL — ABNORMAL LOW (ref 12.0–15.0)
Immature Granulocytes: 0 %
Lymphocytes Relative: 49 %
Lymphs Abs: 2.5 10*3/uL (ref 0.7–4.0)
MCH: 24.8 pg — ABNORMAL LOW (ref 26.0–34.0)
MCHC: 30 g/dL (ref 30.0–36.0)
MCV: 82.6 fL (ref 80.0–100.0)
Monocytes Absolute: 0.2 10*3/uL (ref 0.1–1.0)
Monocytes Relative: 5 %
Neutro Abs: 2.1 10*3/uL (ref 1.7–7.7)
Neutrophils Relative %: 41 %
Platelets: 273 10*3/uL (ref 150–400)
RBC: 4.2 MIL/uL (ref 3.87–5.11)
RDW: 21 % — ABNORMAL HIGH (ref 11.5–15.5)
WBC: 5.1 10*3/uL (ref 4.0–10.5)
nRBC: 0 % (ref 0.0–0.2)

## 2019-07-19 LAB — COMPREHENSIVE METABOLIC PANEL
ALT: 14 U/L (ref 0–44)
AST: 15 U/L (ref 15–41)
Albumin: 3.8 g/dL (ref 3.5–5.0)
Alkaline Phosphatase: 50 U/L (ref 38–126)
Anion gap: 7 (ref 5–15)
BUN: 11 mg/dL (ref 6–20)
CO2: 25 mmol/L (ref 22–32)
Calcium: 8.9 mg/dL (ref 8.9–10.3)
Chloride: 106 mmol/L (ref 98–111)
Creatinine, Ser: 0.7 mg/dL (ref 0.44–1.00)
GFR calc Af Amer: >60 mL/min (ref >60)
GFR calc non Af Amer: >60 mL/min (ref >60)
Glucose, Bld: 110 mg/dL — ABNORMAL HIGH (ref 70–99)
Potassium: 3.7 mmol/L (ref 3.5–5.1)
Sodium: 138 mmol/L (ref 135–145)
Total Bilirubin: 0.4 mg/dL (ref 0.3–1.2)
Total Protein: 6.9 g/dL (ref 6.5–8.1)

## 2019-07-19 LAB — IRON AND TIBC
Iron: 42 ug/dL (ref 28–170)
Saturation Ratios: 11 % (ref 10.4–31.8)
TIBC: 380 ug/dL (ref 250–450)
UIBC: 338 ug/dL

## 2019-07-19 LAB — FERRITIN: Ferritin: 51 ng/mL (ref 11–307)

## 2019-07-19 LAB — LACTATE DEHYDROGENASE: LDH: 128 U/L (ref 98–192)

## 2019-07-19 NOTE — Assessment & Plan Note (Addendum)
#   May 2020-Acute DVT of the right lower extremity-calf veins small clot burden-currently on Xarelto; prior history of provoked DVT [left calf/lesser saphenous vein]  #Symptomatic improvement noted.  Recommend total of 4 months of anticoagulation; finish end of September 2020.  #Hypercoagulable state -reviewed the labs available from gynecologist office none hypercoagulable comparable. Recommend hypercoagulable work-up after coming off anticoagulation for 2 weeks.   Length of anticoagulation is unclear at this time-however difficult recommend long-term/indefinite anticoagulation for 2 episodes of DVT [1 provoked; other unprovoked-most recent].   #Anemia-hemoglobin around 10.  Likely secondary to heavy menstrual periods.  Patient fatigue.  Recommend IV venofer [insurance]  # DISPOSITION: add iron studies/ferritin to today labs # IV venofer next week # 6 weeks- labs [ordered] # Follow up in 2 months- MD ; no labs; possible IV venofer-Dr.B

## 2019-07-19 NOTE — Progress Notes (Signed)
[facility] CONSULT NOTE  Patient Care Team: Arnetha Courser, MD as PCP - General (Family Medicine)  CHIEF COMPLAINTS/PURPOSE OF CONSULTATION: DVT/PE  # provoked DVT in 2011 [lesser saphenous vein left lower extremity/below calf; BCP/long car ride x 6 months anticoagulation]; Apr 07, 2019-right lower extremity DVT nonocclusive-Xarelto  #Hypercoagulable state-2011?  Factor V Leiden heterozygosity [as per patient; [facility]]-no records  #June 2020-anemia likely iron deficiency/menstrual periods.  Oncology History   No history exists.     HISTORY OF PRESENTING ILLNESS:  Tara Shea 38 y.o.  female history of unprovoked DVT of the right lower extremity/calf DVT currently on Xarelto is here for follow-up.  Patient noted to have fatigue over the last few weeks.  Noted to have heavy menstrual cycle.  Complains of shortness of breath on exertion.  Review of Systems  Constitutional: Positive for malaise/fatigue. Negative for chills, diaphoresis, fever and weight loss.  HENT: Negative for nosebleeds and sore throat.   Eyes: Negative for double vision.  Respiratory: Negative for cough, hemoptysis, sputum production, shortness of breath and wheezing.   Cardiovascular: Negative for chest pain, palpitations, orthopnea and leg swelling.  Gastrointestinal: Negative for abdominal pain, blood in stool, constipation, diarrhea, heartburn, melena, nausea and vomiting.  Genitourinary: Negative for dysuria, frequency and urgency.  Musculoskeletal: Negative for back pain and joint pain.  Skin: Negative.  Negative for itching and rash.  Neurological: Negative for dizziness, tingling, focal weakness, weakness and headaches.  Endo/Heme/Allergies: Does not bruise/bleed easily.  Psychiatric/Behavioral: Negative for depression. The patient is not nervous/anxious and does not have insomnia.      MEDICAL HISTORY:  Past Medical History:  Diagnosis Date  . Asthma   . Bone spur    . DVT (deep venous thrombosis) ([place])   . Factor 5 Leiden mutation, heterozygous ([place])   . Fibroid   . IDA (iron deficiency anemia)   . Migraines   . PCOS (polycystic ovarian syndrome)     SURGICAL HISTORY: Past Surgical History:  Procedure Laterality Date  . MYOMECTOMY    . TONSILLECTOMY    . WISDOM TOOTH EXTRACTION    . WRIST SURGERY     age 57 - rebroke it b/c it grew back wrong    SOCIAL HISTORY: Social History   Socioeconomic History  . Marital status: Married    Spouse name: Not on file  . Number of children: 0  . Years of education: 38  . Highest education level: Not on file  Occupational History  . Occupation: Therapist, sports    Comment: ED  Social Needs  . Financial resource strain: Not hard at all  . Food insecurity    Worry: Never true    Inability: Never true  . Transportation needs    Medical: No    Non-medical: No  Tobacco Use  . Smoking status: Never Smoker  . Smokeless tobacco: Never Used  Substance and Sexual Activity  . Alcohol use: Yes    Comment: occ  . Drug use: No  . Sexual activity: Yes    Birth control/protection: Condom  Lifestyle  . Physical activity    Days per week: 0 days    Minutes per session: Not on file  . Stress: Not at all  Relationships  . Social connections    Talks on phone: More than three times a week    Gets together: Once a week    Attends religious service: 1 to 4 times per year    Active member of club  or organization: No    Attends meetings of clubs or organizations: Never    Relationship status: Married  . Intimate partner violence    Fear of current or ex partner: No    Emotionally abused: No    Physically abused: No    Forced sexual activity: No  Other Topics Concern  . 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  No children.        FAMILY HISTORY: Family History  Problem Relation Age of Onset  . Heart murmur Mother   . Thyroid disease Mother   . Migraines  Mother   . Asthma Mother   . Diabetes Mother        TYPE 2  . Bipolar disorder Mother   . Breast cancer Maternal Aunt 29  . Cancer Maternal Uncle        skin  . Cancer Maternal Grandmother 54       colon  . Hypertension Maternal Grandmother   . Cancer Maternal Aunt 50       colon  . Diabetes Maternal Aunt     ALLERGIES:  has No Known Allergies.  MEDICATIONS:  Current Outpatient Medications  Medication Sig Dispense Refill  . albuterol (PROVENTIL HFA;VENTOLIN HFA) 108 (90 Base) MCG/ACT inhaler Inhale 2 puffs into the lungs every 4 (four) hours as needed for wheezing or shortness of breath. 1 Inhaler 2  . albuterol (PROVENTIL) (2.5 MG/3ML) 0.083% nebulizer solution Take 3 mLs (2.5 mg total) by nebulization every 6 (six) hours as needed for wheezing or shortness of breath. 75 mL 12  . fluticasone (FLOVENT HFA) 220 MCG/ACT inhaler Inhale 2 puffs into the lungs 2 (two) times daily. 1 Inhaler 12  . ondansetron (ZOFRAN ODT) 4 MG disintegrating tablet Take 1 tablet (4 mg total) by mouth every 6 (six) hours as needed for nausea. 20 tablet 3  . promethazine (PHENERGAN) 25 MG tablet Take 25 mg by mouth as needed for nausea or vomiting.    . rivaroxaban (XARELTO) 20 MG TABS tablet Take 1 tablet (20 mg total) by mouth daily with supper. 30 tablet 3  . Spacer/Aero-Holding Chambers (AEROCHAMBER PLUS) inhaler Use as instructed 1 each 2  . loratadine (CLARITIN) 10 MG tablet Take 1 tablet (10 mg total) by mouth daily. (Patient not taking: Reported on 04/18/2019) 30 tablet 11  . SUMAtriptan (IMITREX) 20 MG/ACT nasal spray Place 20 mg into the nose every 2 (two) hours as needed for migraine or headache. May repeat in 2 hours if headache persists or recurs.     No current facility-administered medications for this visit.       Marland Kitchen  PHYSICAL EXAMINATION:  Vitals:   07/19/19 1036  BP: 122/83  Pulse: 83  Resp: 20  Temp: (!) 96.7 F (35.9 C)   Filed Weights   07/19/19 1036  Weight: (!) 315 lb  (142.9 kg)    Physical Exam  Constitutional: She is oriented to person, place, and time and well-developed, well-nourished, and in no distress.  Obese.  HENT:  Head: Normocephalic and atraumatic.  Mouth/Throat: Oropharynx is clear and moist. No oropharyngeal exudate.  Eyes: Pupils are equal, round, and reactive to light.  Neck: Normal range of motion. Neck supple.  Cardiovascular: Normal rate and regular rhythm.  Pulmonary/Chest: Effort normal and breath sounds normal. No respiratory distress. She has no wheezes.  Abdominal: Soft. Bowel sounds are normal. She exhibits no distension and no mass. There is  no abdominal tenderness. There is no rebound and no guarding.  Musculoskeletal: Normal range of motion.        General: No tenderness or edema.  Neurological: She is alert and oriented to person, place, and time.  Skin: Skin is warm.  Psychiatric: Affect normal.    LABORATORY DATA:  I have reviewed the data as listed Lab Results  Component Value Date   WBC 5.1 07/19/2019   HGB 10.4 (L) 07/19/2019   HCT 34.7 (L) 07/19/2019   MCV 82.6 07/19/2019   PLT 273 07/19/2019   Recent Labs    08/02/18 1107 07/19/19 1021  NA 137 138  K 4.0 3.7  CL 106 106  CO2 23 25  GLUCOSE 95 110*  BUN 13 11  CREATININE 0.79 0.70  CALCIUM 9.1 8.9  GFRNONAA 96 >60  GFRAA 111 >60  PROT 7.0 6.9  ALBUMIN  --  3.8  AST 13 15  ALT 10 14  ALKPHOS  --  50  BILITOT 0.3 0.4    RADIOGRAPHIC STUDIES: I have personally reviewed the radiological images as listed and agreed with the findings in the report. No results found.  ASSESSMENT & PLAN:   Acute deep vein thrombosis (DVT) of calf muscle vein of right lower extremity # May 2020-Acute DVT of the right lower extremity-calf veins small clot burden-currently on Xarelto; prior history of provoked DVT [left calf/lesser saphenous vein]  #Symptomatic improvement noted.  Recommend total of 4 months of anticoagulation; finish end of September  2020.  #Hypercoagulable state -reviewed the labs available from gynecologist office none hypercoagulable comparable. Recommend hypercoagulable work-up after coming off anticoagulation for 2 weeks.   Length of anticoagulation is unclear at this time-however difficult recommend long-term/indefinite anticoagulation for 2 episodes of DVT [1 provoked; other unprovoked-most recent].   #Anemia-hemoglobin around 10.  Likely secondary to heavy menstrual periods.  Patient fatigue.  Recommend IV venofer [insurance]  # DISPOSITION: add iron studies/ferritin to today labs # IV venofer next week # 6 weeks- labs [ordered] # Follow up in 2 months- MD ; no labs; possible IV venofer-Dr.B  All questions were answered. The patient knows to call the clinic with any problems, questions or concerns.     Cammie Sickle, MD 07/19/2019 1:43 PM

## 2019-07-19 NOTE — Patient Instructions (Signed)
#   STOP xalreto end of September 2020.

## 2019-07-26 ENCOUNTER — Encounter: Payer: Self-pay | Admitting: Emergency Medicine

## 2019-07-26 ENCOUNTER — Inpatient Hospital Stay: Payer: 59

## 2019-07-26 ENCOUNTER — Emergency Department: Payer: 59

## 2019-07-26 ENCOUNTER — Other Ambulatory Visit: Payer: Self-pay

## 2019-07-26 ENCOUNTER — Inpatient Hospital Stay
Admission: EM | Admit: 2019-07-26 | Discharge: 2019-07-29 | DRG: 418 | Disposition: A | Discharge status: Home / Self Care | Payer: 59 | Attending: Internal Medicine | Admitting: Internal Medicine

## 2019-07-26 DIAGNOSIS — E282 Polycystic ovarian syndrome: Secondary | ICD-10-CM | POA: Diagnosis present

## 2019-07-26 DIAGNOSIS — N858 Other specified noninflammatory disorders of uterus: Secondary | ICD-10-CM | POA: Diagnosis present

## 2019-07-26 DIAGNOSIS — G43109 Migraine with aura, not intractable, without status migrainosus: Secondary | ICD-10-CM | POA: Diagnosis not present

## 2019-07-26 DIAGNOSIS — N920 Excessive and frequent menstruation with regular cycle: Secondary | ICD-10-CM | POA: Diagnosis present

## 2019-07-26 DIAGNOSIS — K8000 Calculus of gallbladder with acute cholecystitis without obstruction: Secondary | ICD-10-CM | POA: Diagnosis present

## 2019-07-26 DIAGNOSIS — Z808 Family history of malignant neoplasm of other organs or systems: Secondary | ICD-10-CM | POA: Diagnosis not present

## 2019-07-26 DIAGNOSIS — Z818 Family history of other mental and behavioral disorders: Secondary | ICD-10-CM

## 2019-07-26 DIAGNOSIS — Z6841 Body Mass Index (BMI) 40.0 and over, adult: Secondary | ICD-10-CM

## 2019-07-26 DIAGNOSIS — G43909 Migraine, unspecified, not intractable, without status migrainosus: Secondary | ICD-10-CM | POA: Diagnosis present

## 2019-07-26 DIAGNOSIS — R739 Hyperglycemia, unspecified: Secondary | ICD-10-CM | POA: Diagnosis not present

## 2019-07-26 DIAGNOSIS — Z820 Family history of epilepsy and other diseases of the nervous system: Secondary | ICD-10-CM

## 2019-07-26 DIAGNOSIS — Z7951 Long term (current) use of inhaled steroids: Secondary | ICD-10-CM

## 2019-07-26 DIAGNOSIS — Z8249 Family history of ischemic heart disease and other diseases of the circulatory system: Secondary | ICD-10-CM

## 2019-07-26 DIAGNOSIS — D6851 Activated protein C resistance: Secondary | ICD-10-CM | POA: Diagnosis present

## 2019-07-26 DIAGNOSIS — K8010 Calculus of gallbladder with chronic cholecystitis without obstruction: Secondary | ICD-10-CM | POA: Diagnosis not present

## 2019-07-26 DIAGNOSIS — K8020 Calculus of gallbladder without cholecystitis without obstruction: Secondary | ICD-10-CM | POA: Diagnosis present

## 2019-07-26 DIAGNOSIS — Z800 Family history of malignant neoplasm of digestive organs: Secondary | ICD-10-CM

## 2019-07-26 DIAGNOSIS — N854 Malposition of uterus: Secondary | ICD-10-CM | POA: Diagnosis present

## 2019-07-26 DIAGNOSIS — Z20828 Contact with and (suspected) exposure to other viral communicable diseases: Secondary | ICD-10-CM | POA: Diagnosis present

## 2019-07-26 DIAGNOSIS — R1110 Vomiting, unspecified: Secondary | ICD-10-CM

## 2019-07-26 DIAGNOSIS — K760 Fatty (change of) liver, not elsewhere classified: Secondary | ICD-10-CM | POA: Diagnosis present

## 2019-07-26 DIAGNOSIS — Z803 Family history of malignant neoplasm of breast: Secondary | ICD-10-CM

## 2019-07-26 DIAGNOSIS — Z86718 Personal history of other venous thrombosis and embolism: Secondary | ICD-10-CM | POA: Diagnosis not present

## 2019-07-26 DIAGNOSIS — R1011 Right upper quadrant pain: Secondary | ICD-10-CM

## 2019-07-26 DIAGNOSIS — Z9089 Acquired absence of other organs: Secondary | ICD-10-CM

## 2019-07-26 DIAGNOSIS — Z79899 Other long term (current) drug therapy: Secondary | ICD-10-CM

## 2019-07-26 DIAGNOSIS — Z833 Family history of diabetes mellitus: Secondary | ICD-10-CM

## 2019-07-26 DIAGNOSIS — J4520 Mild intermittent asthma, uncomplicated: Secondary | ICD-10-CM | POA: Diagnosis present

## 2019-07-26 DIAGNOSIS — R740 Nonspecific elevation of levels of transaminase and lactic acid dehydrogenase [LDH]: Secondary | ICD-10-CM | POA: Diagnosis not present

## 2019-07-26 DIAGNOSIS — Z825 Family history of asthma and other chronic lower respiratory diseases: Secondary | ICD-10-CM | POA: Diagnosis not present

## 2019-07-26 DIAGNOSIS — E538 Deficiency of other specified B group vitamins: Secondary | ICD-10-CM | POA: Diagnosis not present

## 2019-07-26 DIAGNOSIS — Z23 Encounter for immunization: Secondary | ICD-10-CM | POA: Diagnosis not present

## 2019-07-26 DIAGNOSIS — Z8349 Family history of other endocrine, nutritional and metabolic diseases: Secondary | ICD-10-CM

## 2019-07-26 DIAGNOSIS — Z7901 Long term (current) use of anticoagulants: Secondary | ICD-10-CM

## 2019-07-26 DIAGNOSIS — K810 Acute cholecystitis: Secondary | ICD-10-CM | POA: Diagnosis not present

## 2019-07-26 DIAGNOSIS — R112 Nausea with vomiting, unspecified: Secondary | ICD-10-CM

## 2019-07-26 DIAGNOSIS — Z03818 Encounter for observation for suspected exposure to other biological agents ruled out: Secondary | ICD-10-CM | POA: Diagnosis not present

## 2019-07-26 DIAGNOSIS — D539 Nutritional anemia, unspecified: Secondary | ICD-10-CM | POA: Diagnosis present

## 2019-07-26 DIAGNOSIS — D259 Leiomyoma of uterus, unspecified: Secondary | ICD-10-CM | POA: Diagnosis not present

## 2019-07-26 HISTORY — DX: Calculus of gallbladder without cholecystitis without obstruction: K80.20

## 2019-07-26 LAB — URINALYSIS, COMPLETE (UACMP) WITH MICROSCOPIC
Bacteria, UA: NONE SEEN
Bilirubin Urine: NEGATIVE
Glucose, UA: NEGATIVE mg/dL
Hgb urine dipstick: NEGATIVE
Ketones, ur: 5 mg/dL — AB
Leukocytes,Ua: NEGATIVE
Nitrite: NEGATIVE
Protein, ur: NEGATIVE mg/dL
Specific Gravity, Urine: 1.018 (ref 1.005–1.030)
pH: 8 (ref 5.0–8.0)

## 2019-07-26 LAB — COMPREHENSIVE METABOLIC PANEL
ALT: 36 U/L (ref 0–44)
AST: 70 U/L — ABNORMAL HIGH (ref 15–41)
Albumin: 3.8 g/dL (ref 3.5–5.0)
Alkaline Phosphatase: 77 U/L (ref 38–126)
Anion gap: 11 (ref 5–15)
BUN: 15 mg/dL (ref 6–20)
CO2: 22 mmol/L (ref 22–32)
Calcium: 9.1 mg/dL (ref 8.9–10.3)
Chloride: 106 mmol/L (ref 98–111)
Creatinine, Ser: 0.79 mg/dL (ref 0.44–1.00)
GFR calc Af Amer: >60 mL/min (ref >60)
GFR calc non Af Amer: >60 mL/min (ref >60)
Glucose, Bld: 142 mg/dL — ABNORMAL HIGH (ref 70–99)
Potassium: 3.9 mmol/L (ref 3.5–5.1)
Sodium: 139 mmol/L (ref 135–145)
Total Bilirubin: 0.7 mg/dL (ref 0.3–1.2)
Total Protein: 7.3 g/dL (ref 6.5–8.1)

## 2019-07-26 LAB — SURGICAL PCR SCREEN
MRSA, PCR: NEGATIVE
Staphylococcus aureus: POSITIVE — AB

## 2019-07-26 LAB — LIPASE, BLOOD: Lipase: 24 U/L (ref 11–51)

## 2019-07-26 LAB — HCG, QUANTITATIVE, PREGNANCY: hCG, Beta Chain, Quant, S: <1 m[IU]/mL (ref <5)

## 2019-07-26 LAB — PROTIME-INR
INR: 1.3 — ABNORMAL HIGH (ref 0.8–1.2)
INR: 1.1 (ref 0.8–1.2)
Prothrombin Time: 15.9 seconds — ABNORMAL HIGH (ref 11.4–15.2)
Prothrombin Time: 14.1 seconds (ref 11.4–15.2)

## 2019-07-26 LAB — APTT
aPTT: 33 seconds (ref 24–36)
aPTT: 67 seconds — ABNORMAL HIGH (ref 24–36)

## 2019-07-26 LAB — TROPONIN I (HIGH SENSITIVITY): Troponin I (High Sensitivity): <2 ng/L (ref <18)

## 2019-07-26 LAB — SARS CORONAVIRUS 2 BY RT PCR (HOSPITAL ORDER, PERFORMED IN ~~LOC~~ HOSPITAL LAB): SARS Coronavirus 2: NEGATIVE

## 2019-07-26 LAB — PREGNANCY, URINE: Preg Test, Ur: NEGATIVE

## 2019-07-26 LAB — HEPARIN LEVEL (UNFRACTIONATED): Heparin Unfractionated: 0.68 IU/mL (ref 0.30–0.70)

## 2019-07-26 MED ORDER — LORATADINE 10 MG PO TABS
10.0000 mg | ORAL_TABLET | Freq: Every day | ORAL | Status: DC
  Administered 2019-07-27 – 2019-07-29 (×2): 10 mg via ORAL
  Filled 2019-07-26 (×2): qty 1

## 2019-07-26 MED ORDER — PNEUMOCOCCAL VAC POLYVALENT 25 MCG/0.5ML IJ INJ
0.5000 mL | INJECTION | INTRAMUSCULAR | Status: AC
  Administered 2019-07-27: 0.5 mL via INTRAMUSCULAR
  Filled 2019-07-26: qty 0.5

## 2019-07-26 MED ORDER — PANTOPRAZOLE SODIUM 40 MG IV SOLR
40.0000 mg | Freq: Once | INTRAVENOUS | Status: AC
  Administered 2019-07-26: 40 mg via INTRAVENOUS
  Filled 2019-07-26: qty 40

## 2019-07-26 MED ORDER — POLYETHYLENE GLYCOL 3350 17 G PO PACK: 17.0000 g | PACK | Freq: Every day | ORAL | Status: DC | PRN

## 2019-07-26 MED ORDER — ACETAMINOPHEN 650 MG RE SUPP: 650.0000 mg | Freq: Four times a day (QID) | RECTAL | Status: DC | PRN

## 2019-07-26 MED ORDER — ONDANSETRON HCL 4 MG/2ML IJ SOLN
4.0000 mg | Freq: Once | INTRAMUSCULAR | Status: AC
  Administered 2019-07-26: 4 mg via INTRAVENOUS
  Filled 2019-07-26: qty 2

## 2019-07-26 MED ORDER — HEPARIN BOLUS VIA INFUSION
4500.0000 [IU] | Freq: Once | INTRAVENOUS | Status: AC
  Administered 2019-07-26: 4500 [IU] via INTRAVENOUS
  Filled 2019-07-26: qty 4500

## 2019-07-26 MED ORDER — FENTANYL CITRATE (PF) 100 MCG/2ML IJ SOLN
75.0000 ug | INTRAMUSCULAR | Status: AC | PRN
  Administered 2019-07-26: 75 ug via INTRAVENOUS
  Administered 2019-07-28: 100 ug via INTRAVENOUS
  Administered 2019-07-28 (×3): 50 ug via INTRAVENOUS
  Filled 2019-07-26: qty 2

## 2019-07-26 MED ORDER — HYDROMORPHONE HCL 1 MG/ML IJ SOLN
1.0000 mg | Freq: Once | INTRAMUSCULAR | Status: AC
  Administered 2019-07-26: 1 mg via INTRAVENOUS
  Filled 2019-07-26: qty 1

## 2019-07-26 MED ORDER — ACETAMINOPHEN 325 MG PO TABS
650.0000 mg | ORAL_TABLET | Freq: Four times a day (QID) | ORAL | Status: DC | PRN
  Administered 2019-07-27: 650 mg via ORAL
  Filled 2019-07-26: qty 2

## 2019-07-26 MED ORDER — BUDESONIDE 0.25 MG/2ML IN SUSP
0.2500 mg | Freq: Two times a day (BID) | RESPIRATORY_TRACT | Status: DC
  Administered 2019-07-27 – 2019-07-29 (×5): 0.25 mg via RESPIRATORY_TRACT
  Filled 2019-07-26 (×5): qty 2

## 2019-07-26 MED ORDER — ONDANSETRON HCL 4 MG PO TABS: 4.0000 mg | ORAL_TABLET | Freq: Four times a day (QID) | ORAL | Status: DC | PRN

## 2019-07-26 MED ORDER — PROMETHAZINE HCL 25 MG/ML IJ SOLN
12.5000 mg | Freq: Once | INTRAMUSCULAR | Status: AC
  Administered 2019-07-26: 12.5 mg via INTRAVENOUS
  Filled 2019-07-26: qty 1

## 2019-07-26 MED ORDER — ALBUTEROL SULFATE (2.5 MG/3ML) 0.083% IN NEBU: 2.5000 mg | INHALATION_SOLUTION | Freq: Four times a day (QID) | RESPIRATORY_TRACT | Status: DC | PRN

## 2019-07-26 MED ORDER — PIPERACILLIN-TAZOBACTAM 3.375 G IVPB
3.3750 g | Freq: Three times a day (TID) | INTRAVENOUS | Status: DC
  Administered 2019-07-26 – 2019-07-29 (×9): 3.375 g via INTRAVENOUS
  Filled 2019-07-26 (×11): qty 50

## 2019-07-26 MED ORDER — ALBUTEROL SULFATE HFA 108 (90 BASE) MCG/ACT IN AERS: 2.0000 | INHALATION_SPRAY | RESPIRATORY_TRACT | Status: DC | PRN

## 2019-07-26 MED ORDER — FLUTICASONE PROPIONATE HFA 220 MCG/ACT IN AERO: 2.0000 | INHALATION_SPRAY | Freq: Two times a day (BID) | RESPIRATORY_TRACT | Status: DC

## 2019-07-26 MED ORDER — MUPIROCIN 2 % EX OINT
1.0000 "application " | TOPICAL_OINTMENT | Freq: Two times a day (BID) | CUTANEOUS | Status: DC
  Administered 2019-07-26 – 2019-07-29 (×5): 1 via NASAL
  Filled 2019-07-26: qty 22

## 2019-07-26 MED ORDER — PROMETHAZINE HCL 25 MG/ML IJ SOLN
25.0000 mg | Freq: Four times a day (QID) | INTRAMUSCULAR | Status: DC | PRN
  Administered 2019-07-26 – 2019-07-28 (×3): 25 mg via INTRAVENOUS
  Filled 2019-07-26 (×4): qty 1

## 2019-07-26 MED ORDER — ONDANSETRON HCL 4 MG/2ML IJ SOLN
4.0000 mg | Freq: Once | INTRAMUSCULAR | Status: AC
  Administered 2019-07-26: 03:00:00 4 mg via INTRAVENOUS
  Filled 2019-07-26: qty 2

## 2019-07-26 MED ORDER — IOHEXOL 300 MG/ML  SOLN
125.0000 mL | Freq: Once | INTRAMUSCULAR | Status: AC | PRN
  Administered 2019-07-26: 06:00:00 125 mL via INTRAVENOUS

## 2019-07-26 MED ORDER — ONDANSETRON HCL 4 MG/2ML IJ SOLN
4.0000 mg | Freq: Four times a day (QID) | INTRAMUSCULAR | Status: DC | PRN
  Administered 2019-07-26 – 2019-07-29 (×5): 4 mg via INTRAVENOUS
  Filled 2019-07-26 (×3): qty 2

## 2019-07-26 MED ORDER — HEPARIN (PORCINE) 25000 UT/250ML-% IV SOLN
1350.0000 [IU]/h | INTRAVENOUS | Status: AC
  Administered 2019-07-26 – 2019-07-27 (×2): 1350 [IU]/h via INTRAVENOUS
  Filled 2019-07-26 (×2): qty 250

## 2019-07-26 MED ORDER — SODIUM CHLORIDE 0.9 % IV SOLN
INTRAVENOUS | Status: DC
  Administered 2019-07-26 – 2019-07-28 (×5): via INTRAVENOUS

## 2019-07-26 NOTE — ED Provider Notes (Signed)
[facility] Emergency Department Provider Note  ____________________________________________   First MD Initiated Contact with Patient 07/26/19 [phone]     (approximate)  I have reviewed the triage vital signs and the nursing notes.   HISTORY  Chief Complaint Abdominal Pain    HPI Tara Shea is a 38 y.o. female with factor V Leiden mutation, PCOS who presents with abdominal pain.  Patient had sudden onset of abdominal pain that started 4 hours ago.  The pain is in her upper abdomen and the right side of her abdomen.  The pain is intermittent, severe, nothing makes it better, nothing makes it worse.  It is associated with nonbloody nonbilious vomiting.  Denies any fevers.  Patient did have a prior myomectomy.  Patient is on Xarelto.   Past Medical History:  Diagnosis Date   Asthma    Bone spur    DVT (deep venous thrombosis) (HCC)    Factor 5 Leiden mutation, heterozygous (HCC)    Fibroid    IDA (iron deficiency anemia)    Migraines    PCOS (polycystic ovarian syndrome)     Patient Active Problem List   Diagnosis Date Noted   Iron deficiency anemia due to chronic blood loss 06/22/2019   Acute deep vein thrombosis (DVT) of calf muscle vein of right lower extremity 04/18/2019   Factor 5 Leiden mutation, heterozygous ([place])    Allergic rhinitis 03/01/2019   Kidney stone 08/02/2018   Asthma, mild intermittent 08/02/2018   Migraine headache with aura 08/02/2018   Morbid obesity ([place]) 08/02/2018    Past Surgical History:  Procedure Laterality Date   MYOMECTOMY     TONSILLECTOMY     WISDOM TOOTH EXTRACTION     WRIST SURGERY     age 64 - rebroke it b/c it grew back wrong    Prior to Admission medications   Medication Sig Start Date End Date Taking? Authorizing Provider  albuterol (PROVENTIL HFA;VENTOLIN HFA) 108 (90 Base) MCG/ACT inhaler Inhale 2 puffs into the lungs every 4 (four) hours as needed for wheezing or  shortness of breath. 03/01/19   Lada, Satira Anis, MD  albuterol (PROVENTIL) (2.5 MG/3ML) 0.083% nebulizer solution Take 3 mLs (2.5 mg total) by nebulization every 6 (six) hours as needed for wheezing or shortness of breath. 11/28/18   Luvenia Redden, PA-C  fluticasone (FLOVENT HFA) 220 MCG/ACT inhaler Inhale 2 puffs into the lungs 2 (two) times daily. 03/01/19   Arnetha Courser, MD  loratadine (CLARITIN) 10 MG tablet Take 1 tablet (10 mg total) by mouth daily. Patient not taking: Reported on 04/18/2019 03/01/19   Arnetha Courser, MD  ondansetron (ZOFRAN ODT) 4 MG disintegrating tablet Take 1 tablet (4 mg total) by mouth every 6 (six) hours as needed for nausea. 06/29/18   Rexene Agent, CNM  promethazine (PHENERGAN) 25 MG tablet Take 25 mg by mouth as needed for nausea or vomiting.    [provider]  rivaroxaban (XARELTO) 20 MG TABS tablet Take 1 tablet (20 mg total) by mouth daily with supper. 05/04/19   Cammie Sickle, MD  Spacer/Aero-Holding Chambers (AEROCHAMBER PLUS) inhaler Use as instructed 05/08/17   Melynda Ripple, MD  SUMAtriptan [facility]) 20 MG/ACT nasal spray Place 20 mg into the nose every 2 (two) hours as needed for migraine or headache. May repeat in 2 hours if headache persists or recurs.    [provider]    Allergies Patient has no known allergies.  Family History  Problem Relation Age of Onset   Heart murmur Mother    Thyroid disease Mother    Migraines Mother    Asthma Mother    Diabetes Mother        TYPE 2   Bipolar disorder Mother    Breast cancer Maternal Aunt 90   Cancer Maternal Uncle        skin   Cancer Maternal Grandmother 54       colon   Hypertension Maternal Grandmother    Cancer Maternal Aunt 50       colon   Diabetes Maternal Aunt     Social History Social History   Tobacco Use   Smoking status: Never Smoker   Smokeless tobacco: Never Used  Substance Use Topics   Alcohol use: Yes    Comment: occ    Drug use: No      Review of Systems Constitutional: No fever/chills Eyes: No visual changes. ENT: No sore throat. Cardiovascular: Denies chest pain. Respiratory: Denies shortness of breath. Gastrointestinal: Positive abdominal pain, vomiting Genitourinary: Negative for dysuria. Musculoskeletal: Negative for back pain. Skin: Negative for rash. Neurological: Negative for headaches, focal weakness or numbness. All other ROS negative ____________________________________________   PHYSICAL EXAM:  VITAL SIGNS: ED Triage Vitals [07/26/19 0235]  Enc Vitals Group     BP 115/88     Pulse Rate 92     Resp (!) 22     Temp 98.1 F (36.7 C)     Temp Source Oral     SpO2 99 %     Weight      Height      Head Circumference      Peak Flow      Pain Score 7     Pain Loc      Pain Edu?      Excl. in [place]?     Constitutional: Alert and oriented.  Crying.  Appears not comfortable.  Eyes: Conjunctivae are normal. EOMI. Head: Atraumatic. Nose: No congestion/rhinnorhea. Mouth/Throat: Mucous membranes are moist.   Neck: No stridor. Trachea Midline. FROM Cardiovascular: Normal rate, regular rhythm. Grossly normal heart sounds.  Good peripheral circulation. Respiratory: Normal respiratory effort.  No retractions. Lungs CTAB. Gastrointestinal: Tender in her upper abdomen.  No distention. No abdominal bruits.  Musculoskeletal: No lower extremity tenderness nor edema.  No joint effusions. Neurologic:  Normal speech and language. No gross focal neurologic deficits are appreciated.  Skin:  Skin is warm, dry and intact. No rash noted. Psychiatric: Mood and affect are normal. Speech and behavior are normal. GU: Deferred   ____________________________________________   LABS (all labs ordered are listed, but only abnormal results are displayed)  Labs Reviewed  CBC WITH DIFFERENTIAL/PLATELET - Abnormal; Notable for the following components:      Result Value   Hemoglobin 11.8 (*)    HCT  49.3 (*)    MCV 106.9 (*)    MCH 25.6 (*)    MCHC 23.9 (*)    RDW 18.7 (*)    All other components within normal limits  COMPREHENSIVE METABOLIC PANEL - Abnormal; Notable for the following components:   Glucose, Bld 142 (*)    AST 70 (*)    All other components within normal limits  PROTIME-INR - Abnormal; Notable for the following components:   Prothrombin Time 15.9 (*)    INR 1.3 (*)    All other components within normal limits  SARS CORONAVIRUS 2 (HOSPITAL ORDER, [facility])  LIPASE,  BLOOD  APTT  URINALYSIS, COMPLETE (UACMP) WITH MICROSCOPIC  TROPONIN I (HIGH SENSITIVITY)   ____________________________________________   ED ECG REPORT I, Vanessa [place], the attending physician, personally viewed and interpreted this ECG.  EKG is sinus rate of 79, no ST elevation, no T wave inversion, QTC of 443 ____________________________________________  RADIOLOGY I, Vanessa [place], personally viewed and evaluated these images (plain radiographs) as part of my medical decision making, as well as reviewing the written report by the radiologist.  ED MD interpretation: I did review the ultrasound as well and it does show the 1 gallstone.  Official radiology report(s): Ct Abdomen Pelvis W Contrast  Result Date: 07/26/2019 CLINICAL DATA:  Abdominal pain, acute generalized EXAM: CT ABDOMEN AND PELVIS WITH CONTRAST TECHNIQUE: Multidetector CT imaging of the abdomen and pelvis was performed using the standard protocol following bolus administration of intravenous contrast. CONTRAST:  127mL OMNIPAQUE IOHEXOL 300 MG/ML  SOLN COMPARISON:  CT 02/15/2007 FINDINGS: Lower chest: Basilar areas of atelectasis. Normal heart size. No pericardial effusion. Hepatobiliary: No focal hepatic abnormality. Mild periportal edema. Few calcified gallstones within the body of the gallbladder. No pericholecystic inflammation. No gallbladder wall thickening. No biliary ductal dilatation or calcified  intraductal gallstones. Pancreas: Unremarkable. No pancreatic ductal dilatation or surrounding inflammatory changes. Spleen: Normal in size without focal abnormality. Adrenals/Urinary Tract: Adrenal glands are unremarkable. Kidneys are normal, without renal calculi, focal lesion, or hydronephrosis. Mild bladder wall thickening though urinary bladder is largely decompressed at the time of exam and therefore poorly evaluated by CT imaging. Stomach/Bowel: Distal esophagus, stomach and duodenal sweep are unremarkable. No bowel wall thickening or dilatation. No evidence of obstruction. A normal appendix is visualized. Vascular/Lymphatic: Normal caliber aorta. No aneurysm or ectasia. No suspicious or enlarged lymph nodes in the included lymphatic chains. Reproductive: There is an enlarged appearance of the anteverted uterus with lobular heterogeneity at the uterine fundus associated with the endometrial stripe and a small hypoattenuating 11 mm structure seen centrally there is mild thickening of the endometrial stripe at the lower uterine segment. Few dominant follicles are noted in the left ovary measuring up to 2.4 cm. No concerning adnexal lesions. Other: No abdominopelvic free fluid or free gas. No bowel containing hernias. Musculoskeletal: Multilevel degenerative changes are present in the imaged portions of the spine. Findings are maximal in the lower lumbar levels L4-S1. No acute osseous abnormality or suspicious osseous lesion. IMPRESSION: 1. Cholelithiasis without CT evidence of acute cholecystitis. Mild periportal edema is nonspecific. 2. Enlarged appearance of the anteverted uterus with lobular heterogeneity at the uterine fundus associated with the endometrial stripe and a small 11 mm hypoattenuating structure centrally there is mild thickening of the endometrial stripe at the lower uterine segment. Findings raise suspicion for early pregnancy versus irregular fibroid uterus. Further evaluation with pelvic  ultrasound as clinically indicated. 3. Mild bladder wall thickening though urinary bladder is largely decompressed at the time of exam and therefore poorly evaluated by CT imaging. Correlate with urinalysis to exclude cystitis. These results were called by telephone at the time of interpretation on 07/26/2019 at 7:01 am to Dr. Marjean Donna , who verbally acknowledged these results. Electronically Signed   By: Lovena Le M.D.   On: 07/26/2019 07:02   US Abdomen Limited Ruq  Result Date: 07/26/2019 CLINICAL DATA:  Initial evaluation for acute right upper quadrant pain, nausea, vomiting. EXAM: ULTRASOUND ABDOMEN LIMITED RIGHT UPPER QUADRANT COMPARISON:  Prior CT from 02/15/2007. FINDINGS: Gallbladder: Several scattered shadowing echogenic stones present within the gallbladder  lumen, with a dominant stone position at the gallbladder neck measuring 2.7 cm. Gallbladder wall measure within normal limits at 2.3 mm in thickness. No free pericholecystic fluid. No sonographic Murphy sign elicited on exam. Common bile duct: Diameter: 5.6 mm Liver: No focal lesion identified. Within normal limits in parenchymal echogenicity. Portal vein is patent on color Doppler imaging with normal direction of blood flow towards the liver. Other: None. IMPRESSION: 1. Cholelithiasis with dominant 2.7 cm stone positioned at the gallbladder neck. No sonographic evidence for acute cholecystitis. 2. No biliary dilatation. Electronically Signed   By: Jeannine Boga M.D.   On: 07/26/2019 04:19    ____________________________________________   PROCEDURES  Procedure(s) performed (including Critical Care):  Procedures   ____________________________________________   INITIAL IMPRESSION / ASSESSMENT AND PLAN / ED COURSE  Tara Shea was evaluated in Emergency Department on 07/26/2019 for the symptoms described in the history of present illness. She was evaluated in the context of the global COVID-19 pandemic, which  necessitated consideration that the patient might be at risk for infection with the SARS-CoV-2 virus that causes COVID-19. Institutional protocols and algorithms that pertain to the evaluation of patients at risk for COVID-19 are in a state of rapid change based on information released by regulatory bodies including the CDC and federal and state organizations. These policies and algorithms were followed during the patient's care in the ED.    Patient is a 38 year old female who presents with upper abdominal pain that is severe.  Will get ultrasound to evaluate for cholecystitis, choledocholithiasis, biliary colic.  Will get labs as well to evaluate for pancreatitis.  No lower abdominal pain to suggest issues with her ovaries.Will get pregnancy test.   Labs are reassuring except for slightly elevated AST at 70.  Ultrasound with evidence of gallstones without evidence of cholecystitis.  4:50 AM reevaluated patient.  Patient's pain is better although she did get Dilaudid recently.  Patient like to wait a little bit to make sure that her pain does not come back.   5:35 AM pt vomiting and dry heaving again after ginger ale.   Discussed with patient and will get CT to make sure there is no other causes of patient's pain such as ulcer perforation.  Patient will most likely need admission given her continued vomiting and pain.  Covid negative    CT with an abnormal read of her uterus however patient has no lower pelvic pain.  Will add on pelvic ultrasound but I have low suspicion that this is the cause of her vomiting.  Pregnancy test was negative  Discussed the hospital team for admission given unable to tolerate p.o. They will admit pt.       ____________________________________________   FINAL CLINICAL IMPRESSION(S) / ED DIAGNOSES   Final diagnoses:  Right upper quadrant abdominal pain  Vomiting      MEDICATIONS GIVEN DURING THIS VISIT:  Medications  fentaNYL (SUBLIMAZE) injection  75 mcg (75 mcg Intravenous Given 07/26/19 0258)  ondansetron (ZOFRAN) injection 4 mg (4 mg Intravenous Given 07/26/19 0257)  pantoprazole (PROTONIX) injection 40 mg (40 mg Intravenous Given 07/26/19 0300)  HYDROmorphone (DILAUDID) injection 1 mg (1 mg Intravenous Given 07/26/19 0421)  ondansetron (ZOFRAN) injection 4 mg (4 mg Intravenous Given 07/26/19 0421)  promethazine (PHENERGAN) injection 12.5 mg (12.5 mg Intravenous Given 07/26/19 0544)  iohexol (OMNIPAQUE) 300 MG/ML solution 125 mL (125 mLs Intravenous Contrast Given 07/26/19 0604)     ED Discharge Orders    None  Note:  This document was prepared using Dragon voice recognition software and may include unintentional dictation errors.   Vanessa [place], MD 07/26/19 [phone]

## 2019-07-26 NOTE — Progress Notes (Signed)
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   No Known Allergies  Patient Measurements: Height: 5\' 8"  (172.7 cm) Weight: (!) 317 lb 5.4 oz (143.9 kg) IBW/kg (Calculated) : 63.9 Heparin Dosing Weight: 98.7 kg   Vital Signs: Temp: 98.1 F (36.7 C) (09/02 1801) Temp Source: Oral (09/02 1801) BP: 104/65 (09/02 1801) Pulse Rate: 78 (09/02 1801)  Labs: Recent Labs    07/26/19 0249 07/26/19 2019  HGB 11.8*  --   HCT 49.3*  --   PLT 270  --   APTT 33 67*  LABPROT 15.9* 14.1  INR 1.3* 1.1  HEPARINUNFRC  --  0.68  CREATININE 0.79  --   TROPONINIHS <2  --     Estimated Creatinine Clearance: 144.3 mL/min (by C-G formula based on SCr of 0.79 mg/dL).   Medical History: Past Medical History:  Diagnosis Date  . Asthma   . Bone spur   . DVT (deep venous thrombosis) ([place])   . Factor 5 Leiden mutation, heterozygous ([place])   . Fibroid   . IDA (iron deficiency anemia)   . Migraines   . PCOS (polycystic ovarian syndrome)     Medications:  Medications Prior to Admission  Medication Sig Dispense Refill Last Dose  . albuterol (PROVENTIL HFA;VENTOLIN HFA) 108 (90 Base) MCG/ACT inhaler Inhale 2 puffs into the lungs every 4 (four) hours as needed for wheezing or shortness of breath. 1 Inhaler 2 Unknown at PRN  . albuterol (PROVENTIL) (2.5 MG/3ML) 0.083% nebulizer solution Take 3 mLs (2.5 mg total) by nebulization every 6 (six) hours as needed for wheezing or shortness of breath. 75 mL 12 Unknown at PRN  . ferrous sulfate 325 (65 FE) MG tablet Take 325 mg by mouth daily.   07/25/2019 at 1900  . fluticasone (FLOVENT HFA) 220 MCG/ACT inhaler Inhale 2 puffs into the lungs 2 (two) times daily. 1 Inhaler 12 07/25/2019 at Unknown time  . Multiple Vitamin (MULTIVITAMIN WITH MINERALS) TABS tablet Take 1 tablet by mouth daily.   07/25/2019 at 1900  . ondansetron (ZOFRAN ODT) 4 MG disintegrating tablet Take 1  tablet (4 mg total) by mouth every 6 (six) hours as needed for nausea. 20 tablet 3 Unknown at PRN  . promethazine (PHENERGAN) 25 MG tablet Take 25 mg by mouth as needed for nausea or vomiting.   Unknown at PRN  . rivaroxaban (XARELTO) 20 MG TABS tablet Take 1 tablet (20 mg total) by mouth daily with supper. 30 tablet 3 07/25/2019 at 1900  . Spacer/Aero-Holding Chambers (AEROCHAMBER PLUS) inhaler Use as instructed 1 each 2   . vitamin C (ASCORBIC ACID) 500 MG tablet Take 500 mg by mouth daily.   07/25/2019 at 1900  . loratadine (CLARITIN) 10 MG tablet Take 1 tablet (10 mg total) by mouth daily. 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 Pt on rivaroxaban PTA. Surgery plan for 9/4.   Goal of Therapy:  Heparin level 0.3-0.7 units/ml, when aPTT and HL correlate.  aPTT 66-102 seconds Monitor platelets by anticoagulation protocol: Yes   Plan:  Give 4500 units bolus x 1 Start heparin infusion at 1350 units/hr Check anti-Xa level in 6 hours and daily while on heparin Continue to monitor H&H and platelets  9/2 @ 2019:  HL = 0.68, aPTT = 67 Now that aPTT  and HL are both therapeutic, can use HL to guide dosing.   Tara Shea D 07/26/2019,10:14 PM

## 2019-07-26 NOTE — ED Notes (Signed)
Patient transported to Ultrasound 

## 2019-07-26 NOTE — ED Notes (Signed)
Patient provided with gingerale.

## 2019-07-26 NOTE — ED Notes (Signed)
.. ED TO INPATIENT HANDOFF REPORT  ED Nurse Name and Phone #: Deneise Lever [phone]  S Name/Age/Gender Tara Shea 38 y.o. female Room/Bed: ED08A/ED08A  Code Status   Code Status: Not on file  Home/SNF/Other Home Patient oriented to: self, place, time and situation Is this baseline? Yes   Triage Complete: Triage complete  Chief Complaint abdominal pain   Triage Note Patient ambulatory to triage, tearful, appears uncomfortable, mask in place; Pt reports sudden onset mid upper abd pain radiating thru to back accomp by N/V; denies hx of same; recent iron infusion   Allergies No Known Allergies  Level of Care/Admitting Diagnosis ED Disposition    ED Disposition Condition [place]: [place] [[id number]]  Level of Care: Med-Surg [16]  Covid Evaluation: Asymptomatic Screening Protocol (No Symptoms)  Diagnosis: Cholelithiasis [id number]  Admitting Physician: Hyman Bible DODD [id number]  Attending Physician: Hyman Bible DODD [id number]  Estimated length of stay: past midnight tomorrow  Certification:: I certify this patient will need inpatient services for at least 2 midnights  PT Class (Do Not Modify): Inpatient [101]  PT Acc Code (Do Not Modify): Private [1]       B Medical/Surgery History Past Medical History:  Diagnosis Date  . Asthma   . Bone spur   . DVT (deep venous thrombosis) ([place])   . Factor 5 Leiden mutation, heterozygous ([place])   . Fibroid   . IDA (iron deficiency anemia)   . Migraines   . PCOS (polycystic ovarian syndrome)    Past Surgical History:  Procedure Laterality Date  . MYOMECTOMY    . TONSILLECTOMY    . WISDOM TOOTH EXTRACTION    . WRIST SURGERY     age 14 - rebroke it b/c it grew back wrong     A IV Location/Drains/Wounds Patient Lines/Drains/Airways Status   Active Line/Drains/Airways    Name:   Placement date:   Placement time:   Site:   Days:   Peripheral IV 07/26/19 Left Antecubital   07/26/19     -    Antecubital   less than 1          Intake/Output Last 24 hours  Intake/Output Summary (Last 24 hours) at 07/26/2019 1927 Last data filed at 07/26/2019 1816 Gross per 24 hour  Intake 100 ml  Output -  Net 100 ml    Labs/Imaging Results for orders placed or performed during the hospital encounter of 07/26/19 (from the past 48 hour(s))  CBC with Differential     Status: Abnormal   Collection Time: 07/26/19  2:49 AM  Result Value Ref Range   WBC 8.6 4.0 - 10.5 K/uL   RBC 4.61 3.87 - 5.11 MIL/uL   Hemoglobin 11.8 (L) 12.0 - 15.0 g/dL   HCT 49.3 (H) 36.0 - 46.0 %   MCV 106.9 (H) 80.0 - 100.0 fL   MCH 25.6 (L) 26.0 - 34.0 pg   MCHC 23.9 (L) 30.0 - 36.0 g/dL   RDW 18.7 (H) 11.5 - 15.5 %   Platelets 270 150 - 400 K/uL   nRBC 0.0 0.0 - 0.2 %   Neutrophils Relative % 75 %   Neutro Abs 6.5 1.7 - 7.7 K/uL   Lymphocytes Relative 18 %   Lymphs Abs 1.6 0.7 - 4.0 K/uL   Monocytes Relative 5 %   Monocytes Absolute 0.4 0.1 - 1.0 K/uL   Eosinophils Relative 1 %   Eosinophils Absolute 0.1 0.0 - 0.5 K/uL  Basophils Relative 0 %   Basophils Absolute 0.0 0.0 - 0.1 K/uL   Immature Granulocytes 1 %   Abs Immature Granulocytes 0.04 0.00 - 0.07 K/uL    Comment: Performed at [facility], [address] [place], [place] [address]  Comprehensive metabolic panel     Status: Abnormal   Collection Time: 07/26/19  2:49 AM  Result Value Ref Range   Sodium 139 135 - 145 mmol/L   Potassium 3.9 3.5 - 5.1 mmol/L   Chloride 106 98 - 111 mmol/L   CO2 22 22 - 32 mmol/L   Glucose, Bld 142 (H) 70 - 99 mg/dL   BUN 15 6 - 20 mg/dL   Creatinine, Ser 0.79 0.44 - 1.00 mg/dL   Calcium 9.1 8.9 - 10.3 mg/dL   Total Protein 7.3 6.5 - 8.1 g/dL   Albumin 3.8 3.5 - 5.0 g/dL   AST 70 (H) 15 - 41 U/L   ALT 36 0 - 44 U/L   Alkaline Phosphatase 77 38 - 126 U/L   Total Bilirubin 0.7 0.3 - 1.2 mg/dL   GFR calc non Af Amer >60 >60 mL/min   GFR calc Af Amer >60 >60 mL/min   Anion gap 11 5 - 15     Comment: Performed at [facility], [address] [place], [place] [address]  Lipase, blood     Status: None   Collection Time: 07/26/19  2:49 AM  Result Value Ref Range   Lipase 24 11 - 51 U/L    Comment: Performed at [facility], [address] [place], [place] [address]  Urinalysis, Complete w Microscopic     Status: Abnormal   Collection Time: 07/26/19  2:49 AM  Result Value Ref Range   Color, Urine YELLOW (A) YELLOW   APPearance TURBID (A) CLEAR   Specific Gravity, Urine 1.018 1.005 - 1.030   pH 8.0 5.0 - 8.0   Glucose, UA NEGATIVE NEGATIVE mg/dL   Hgb urine dipstick NEGATIVE NEGATIVE   Bilirubin Urine NEGATIVE NEGATIVE   Ketones, ur 5 (A) NEGATIVE mg/dL   Protein, ur NEGATIVE NEGATIVE mg/dL   Nitrite NEGATIVE NEGATIVE   Leukocytes,Ua NEGATIVE NEGATIVE   RBC / HPF 0-5 0 - 5 RBC/hpf   WBC, UA 0-5 0 - 5 WBC/hpf   Bacteria, UA NONE SEEN NONE SEEN   Squamous Epithelial / LPF 6-10 0 - 5   Mucus PRESENT    Amorphous Crystal PRESENT     Comment: Performed at [facility], [address] [place], [place] [address]  Troponin I (High Sensitivity)     Status: None   Collection Time: 07/26/19  2:49 AM  Result Value Ref Range   Troponin I (High Sensitivity) <2 <18 ng/L    Comment: (NOTE) Elevated high sensitivity troponin I (hsTnI) values and significant  changes across serial measurements may suggest ACS but many other  chronic and acute conditions are known to elevate hsTnI results.  Refer to the "Links" section for chest pain algorithms and additional  guidance. Performed at [facility], [address] [place], [place] [address]   Protime-INR     Status: Abnormal   Collection Time: 07/26/19  2:49 AM  Result Value Ref Range   Prothrombin Time 15.9 (H) 11.4 - 15.2 seconds   INR 1.3 (H) 0.8 - 1.2    Comment: (NOTE) INR goal varies based on device and disease states. Performed at [facility], [place], [place]  [address]   APTT     Status: None   Collection Time: 07/26/19  2:49 AM  Result Value Ref Range   aPTT 33 24 - 36 seconds    Comment: Performed at [facility], [address] [place], [place] [address]  Pregnancy, urine     Status: None   Collection Time: 07/26/19  2:49 AM  Result Value Ref Range   Preg Test, Ur NEGATIVE NEGATIVE    Comment: Performed at [facility], [address] [place], [place] [address]  hCG, quantitative, pregnancy     Status: None   Collection Time: 07/26/19  2:49 AM  Result Value Ref Range   hCG, Beta Chain, Quant, S <1 <5 mIU/mL    Comment:          GEST. AGE      CONC.  (mIU/mL)   <=1 WEEK        5 - 50     2 WEEKS       50 - 500     3 WEEKS       100 - 10,000     4 WEEKS     1,000 - 30,000     5 WEEKS     3,500 - 115,000   6-8 WEEKS     12,000 - 270,000    12 WEEKS     15,000 - 220,000        FEMALE AND NON-PREGNANT FEMALE:     LESS THAN 5 mIU/mL Performed at [facility], [address] [place], [place] [address]   SARS Coronavirus 2 [facility] order, Performed in [facility] hospital lab) Nasopharyngeal Nasopharyngeal Swab     Status: None   Collection Time: 07/26/19  3:08 AM   Specimen: Nasopharyngeal Swab  Result Value Ref Range   SARS Coronavirus 2 NEGATIVE NEGATIVE    Comment: (NOTE) If result is NEGATIVE SARS-CoV-2 target nucleic acids are NOT DETECTED. The SARS-CoV-2 RNA is generally detectable in upper and lower  respiratory specimens during the acute phase of infection. The lowest  concentration of SARS-CoV-2 viral copies this assay can detect is 250  copies / mL. A negative result does not preclude SARS-CoV-2 infection  and should not be used as the sole basis for treatment or other  patient management decisions.  A negative result may occur with  improper specimen collection / handling, submission of specimen other  than nasopharyngeal swab, presence of viral mutation(s) within the  areas  targeted by this assay, and inadequate number of viral copies  (<250 copies / mL). A negative result must be combined with clinical  observations, patient history, and epidemiological information. If result is POSITIVE SARS-CoV-2 target nucleic acids are DETECTED. The SARS-CoV-2 RNA is generally detectable in upper and lower  respiratory specimens dur ing the acute phase of infection.  Positive  results are indicative of active infection with SARS-CoV-2.  Clinical  correlation with patient history and other diagnostic information is  necessary to determine patient infection status.  Positive results do  not rule out bacterial infection or co-infection with other viruses. If result is PRESUMPTIVE POSTIVE SARS-CoV-2 nucleic acids MAY BE PRESENT.   A presumptive positive result was obtained on the submitted specimen  and confirmed on repeat testing.  While 2019 novel coronavirus  (SARS-CoV-2) nucleic acids may be present in the submitted sample  additional confirmatory testing may be necessary for epidemiological  and / or clinical management purposes  to differentiate between  SARS-CoV-2  and other Sarbecovirus currently known to infect humans.  If clinically indicated additional testing with an alternate test  methodology [phone]) is advised. The SARS-CoV-2 RNA is generally  detectable in upper and lower respiratory sp ecimens during the acute  phase of infection. The expected result is Negative. Fact Sheet for Patients:  StrictlyIdeas.no Fact Sheet for Healthcare Providers: BankingDealers.co.za This test is not yet approved or cleared by the [place] FDA and has been authorized for detection and/or diagnosis of SARS-CoV-2 by FDA under an Emergency Use Authorization (EUA).  This EUA will remain in effect (meaning this test can be used) for the duration of the COVID-19 declaration under Section 564(b)(1) of the Act, 21 U.S.C. section  360bbb-3(b)(1), unless the authorization is terminated or revoked sooner. Performed at [facility], [address] [place],  [address]    Ct Abdomen Pelvis W Contrast  Result Date: 07/26/2019 CLINICAL DATA:  Abdominal pain, acute generalized EXAM: CT ABDOMEN AND PELVIS WITH CONTRAST TECHNIQUE: Multidetector CT imaging of the abdomen and pelvis was performed using the standard protocol following bolus administration of intravenous contrast. CONTRAST:  160mL OMNIPAQUE IOHEXOL 300 MG/ML  SOLN COMPARISON:  CT 02/15/2007 FINDINGS: Lower chest: Basilar areas of atelectasis. Normal heart size. No pericardial effusion. Hepatobiliary: No focal hepatic abnormality. Mild periportal edema. Few calcified gallstones within the body of the gallbladder. No pericholecystic inflammation. No gallbladder wall thickening. No biliary ductal dilatation or calcified intraductal gallstones. Pancreas: Unremarkable. No pancreatic ductal dilatation or surrounding inflammatory changes. Spleen: Normal in size without focal abnormality. Adrenals/Urinary Tract: Adrenal glands are unremarkable. Kidneys are normal, without renal calculi, focal lesion, or hydronephrosis. Mild bladder wall thickening though urinary bladder is largely decompressed at the time of exam and therefore poorly evaluated by CT imaging. Stomach/Bowel: Distal esophagus, stomach and duodenal sweep are unremarkable. No bowel wall thickening or dilatation. No evidence of obstruction. A normal appendix is visualized. Vascular/Lymphatic: Normal caliber aorta. No aneurysm or ectasia. No suspicious or enlarged lymph nodes in the included lymphatic chains. Reproductive: There is an enlarged appearance of the anteverted uterus with lobular heterogeneity at the uterine fundus associated with the endometrial stripe and a small hypoattenuating 11 mm structure seen centrally there is mild thickening of the endometrial stripe at the lower uterine segment. Few  dominant follicles are noted in the left ovary measuring up to 2.4 cm. No concerning adnexal lesions. Other: No abdominopelvic free fluid or free gas. No bowel containing hernias. Musculoskeletal: Multilevel degenerative changes are present in the imaged portions of the spine. Findings are maximal in the lower lumbar levels L4-S1. No acute osseous abnormality or suspicious osseous lesion. IMPRESSION: 1. Cholelithiasis without CT evidence of acute cholecystitis. Mild periportal edema is nonspecific. 2. Enlarged appearance of the anteverted uterus with lobular heterogeneity at the uterine fundus associated with the endometrial stripe and a small 11 mm hypoattenuating structure centrally there is mild thickening of the endometrial stripe at the lower uterine segment. Findings raise suspicion for early pregnancy versus irregular fibroid uterus. Further evaluation with pelvic ultrasound as clinically indicated. 3. Mild bladder wall thickening though urinary bladder is largely decompressed at the time of exam and therefore poorly evaluated by CT imaging. Correlate with urinalysis to exclude cystitis. These results were called by telephone at the time of interpretation on 07/26/2019 at 7:01 am to Dr. Marjean Donna , who verbally acknowledged these results. Electronically Signed   By: Lovena Le M.D.   On: 07/26/2019 07:02   US Pelvic Complete With Transvaginal  Result Date: 07/26/2019 CLINICAL DATA:  Vomiting.  Follow-up abnormal CT from 07/26/2019. EXAM: TRANSABDOMINAL AND TRANSVAGINAL ULTRASOUND OF PELVIS TECHNIQUE: Both transabdominal and transvaginal ultrasound examinations of the pelvis were performed. Transabdominal technique was performed for global imaging of the pelvis including uterus, ovaries, adnexal regions, and pelvic cul-de-sac. It was necessary to proceed with endovaginal exam following the transabdominal exam to visualize the uterus and endometrium. COMPARISON:  CT 07/26/2019 FINDINGS: Uterus Measurements:  14.0 by 8.3 by 8.7 cm. = volume: 524 mL. Anteverted. Within the lower uterine segment there is a left anterior myometrial fibroid measuring 1.4 x 1 0.3/2.0 cm. Endometrium Thickness: 47.3 mm. There is a heterogeneous, predominantly solid mass within the endometrium which measures 5.3 x 3.7 x 4.1 cm. There is increased blood flow within this mass. There are also several small internal cystic areas. Right ovary Not visualized. Left ovary Measurements: 2.9 x 3.4 x 2.3 cm = volume: 12 mL. Normal appearance/no adnexal mass. Other findings Small volume of free fluid noted. IMPRESSION: 1. There is a mass identified within the endometrium with a maximum dimension of 5.3 cm. Primary differential considerations include sub mucosal fibroid, endometrial polyp, or carcinoma. Consider sonohysterogram for further evaluation, prior to hysteroscopy or endometrial biopsy. Electronically Signed   By: Kerby Moors M.D.   On: 07/26/2019 09:18   US Abdomen Limited Ruq  Result Date: 07/26/2019 CLINICAL DATA:  Initial evaluation for acute right upper quadrant pain, nausea, vomiting. EXAM: ULTRASOUND ABDOMEN LIMITED RIGHT UPPER QUADRANT COMPARISON:  Prior CT from 02/15/2007. FINDINGS: Gallbladder: Several scattered shadowing echogenic stones present within the gallbladder lumen, with a dominant stone position at the gallbladder neck measuring 2.7 cm. Gallbladder wall measure within normal limits at 2.3 mm in thickness. No free pericholecystic fluid. No sonographic Murphy sign elicited on exam. Common bile duct: Diameter: 5.6 mm Liver: No focal lesion identified. Within normal limits in parenchymal echogenicity. Portal vein is patent on color Doppler imaging with normal direction of blood flow towards the liver. Other: None. IMPRESSION: 1. Cholelithiasis with dominant 2.7 cm stone positioned at the gallbladder neck. No sonographic evidence for acute cholecystitis. 2. No biliary dilatation. Electronically Signed   By: Jeannine Boga M.D.   On: 07/26/2019 04:19    Pending Labs Unresulted Labs (From admission, onward)    Start     Ordered   07/27/19 0500  Comprehensive metabolic panel  Tomorrow morning,   STAT     07/26/19 1454   07/27/19 0500  Vitamin B12  Tomorrow morning,   STAT     07/26/19 1530   07/27/19 0500  Folate  Tomorrow morning,   STAT     07/26/19 1534   07/27/19 0500  Heparin level (unfractionated)  Tomorrow morning,   STAT     07/26/19 1552   07/27/19 0500  CBC  Tomorrow morning,   STAT     07/26/19 1552   07/26/19 2200  APTT  Once-Timed,   STAT     07/26/19 1550   07/26/19 1551  Heparin level (unfractionated)  ONCE - STAT,   STAT     07/26/19 1550   07/26/19 1551  APTT  ONCE - STAT,   STAT     07/26/19 1550   07/26/19 1551  Protime-INR  ONCE - STAT,   STAT     07/26/19 1550   Signed and Held  HIV antibody (Routine Testing)  Once,   R     Signed and Held   Signed and Held  CBC  Tomorrow morning,   R     Signed and Held   Signed and Held  Comprehensive metabolic panel  Tomorrow morning,   R     Signed and Held          Vitals/Pain Today's Vitals   07/26/19 1200 07/26/19 1300 07/26/19 1413 07/26/19 1801  BP: 132/78 102/75 (!) 110/57 104/65  Pulse: 70 79 71 78  Resp: 18 16  16   Temp:   98.2 F (36.8 C) 98.1 F (36.7 C)  TempSrc:   Oral Oral  SpO2: 97% 98% 99% 100%  PainSc:   1  3     Isolation Precautions No active isolations  Medications Medications  fentaNYL (SUBLIMAZE) injection 75 mcg (75 mcg Intravenous Given 07/26/19 0258)  0.9 %  sodium chloride infusion ( Intravenous New Bag/Given 07/26/19 1215)  ondansetron (ZOFRAN) tablet 4 mg ( Oral See Alternative 07/26/19 1212)    Or  ondansetron (ZOFRAN) injection 4 mg (4 mg Intravenous Given 07/26/19 1212)  promethazine (PHENERGAN) injection 25 mg (has no administration in time range)  piperacillin-tazobactam (ZOSYN) IVPB 3.375 g (0 g Intravenous Stopped 07/26/19 1816)  heparin ADULT infusion 100 units/mL (25000 units/249mL  sodium chloride 0.45%) (1,350 Units/hr Intravenous New Bag/Given 07/26/19 1706)  ondansetron (ZOFRAN) injection 4 mg (4 mg Intravenous Given 07/26/19 0257)  pantoprazole (PROTONIX) injection 40 mg (40 mg Intravenous Given 07/26/19 0300)  HYDROmorphone (DILAUDID) injection 1 mg (1 mg Intravenous Given 07/26/19 0421)  ondansetron (ZOFRAN) injection 4 mg (4 mg Intravenous Given 07/26/19 0421)  promethazine (PHENERGAN) injection 12.5 mg (12.5 mg Intravenous Given 07/26/19 0544)  iohexol (OMNIPAQUE) 300 MG/ML solution 125 mL (125 mLs Intravenous Contrast Given 07/26/19 0604)  heparin bolus via infusion 4,500 Units (4,500 Units Intravenous Bolus from Bag 07/26/19 1700)    Mobility walks Low fall risk      R Recommendations: See Admitting Provider Note  Report given to:   Additional Notes:

## 2019-07-26 NOTE — ED Notes (Signed)
Patient transported to CT 

## 2019-07-26 NOTE — ED Notes (Signed)
ED Provider at bedside. 

## 2019-07-26 NOTE — ED Triage Notes (Signed)
Patient ambulatory to triage, tearful, appears uncomfortable, mask in place; Pt reports sudden onset mid upper abd pain radiating thru to back accomp by N/V; denies hx of same; recent iron infusion

## 2019-07-26 NOTE — ED Notes (Signed)
Pt dry heaving, complains of 2/10 pain

## 2019-07-26 NOTE — H&P (Addendum)
[facility] NAME: Tara Shea    MR#:  [id number]  DATE OF BIRTH:  09/21/1981  DATE OF ADMISSION:  07/26/2019  PRIMARY CARE PHYSICIAN: Hubbard Hartshorn, FNP   REQUESTING/REFERRING PHYSICIAN: Marjean Donna, MD  CHIEF COMPLAINT:   Chief Complaint  Patient presents with   Abdominal Pain    HISTORY OF PRESENT ILLNESS:  Tara Shea  is a 38 y.o. female with a known history of PCOS, fibroids, DVT on Xarelto, anemia, and asthma who presented to the ED with nausea, vomiting, and right upper quadrant abdominal pain that started at 11:30 PM yesterday.  The pain is located on the right side of her abdomen and radiates to her back.  She describes the pain as "a sharp and shooting pain".  She states that she is unable to keep any food or liquid down due to the nausea and vomiting.  She denies any fevers or chills.  Her last menstrual period was 8/15.  She endorses heavy menstrual bleeding.  She is not on any birth control.  In the ED, vitals were unremarkable.  Labs were significant for hemoglobin 11.8, MCV 106.9, blood sugar 142.  UA was positive for 5 ketones.  Urine pregnancy test and beta hCG were negative.  Right upper quadrant ultrasound showed cholelithiasis with a 2.7 cm stone in the gallbladder neck, no evidence of acute cholecystitis or biliary dilatation.  CT abdomen/pelvis showed an enlarged appearance of the anteverted uterus with lobular heterogeneity at the uterine fundus, concerning for early pregnancy versus irregular fibroid uterus. Transvaginal pelvic ultrasound showed a mass within the endometrium-differentials include fibroid, polyp, or carcinoma.  She was admitted for further management.  PAST MEDICAL HISTORY:   Past Medical History:  Diagnosis Date   Asthma    Bone spur    DVT (deep venous thrombosis) (HCC)    Factor 5 Leiden mutation, heterozygous (HCC)    Fibroid    IDA (iron deficiency anemia)     Migraines    PCOS (polycystic ovarian syndrome)     PAST SURGICAL HISTORY:   Past Surgical History:  Procedure Laterality Date   MYOMECTOMY     TONSILLECTOMY     WISDOM TOOTH EXTRACTION     WRIST SURGERY     age 31 - rebroke it b/c it grew back wrong    SOCIAL HISTORY:   Social History   Tobacco Use   Smoking status: Never Smoker   Smokeless tobacco: Never Used  Substance Use Topics   Alcohol use: Yes    Comment: occ    FAMILY HISTORY:   Family History  Problem Relation Age of Onset   Heart murmur Mother    Thyroid disease Mother    Migraines Mother    Asthma Mother    Diabetes Mother        TYPE 2   Bipolar disorder Mother    Breast cancer Maternal Aunt 26   Cancer Maternal Uncle        skin   Cancer Maternal Grandmother 5       colon   Hypertension Maternal Grandmother    Cancer Maternal Aunt 50       colon   Diabetes Maternal Aunt     DRUG ALLERGIES:  No Known Allergies  REVIEW OF SYSTEMS:   Review of Systems  Constitutional: Negative for chills and fever.  HENT: Negative for congestion and sore throat.   Eyes: Negative for blurred vision and double  vision.  Respiratory: Negative for cough and shortness of breath.   Cardiovascular: Negative for chest pain and palpitations.  Gastrointestinal: Positive for abdominal pain, nausea and vomiting. Negative for constipation and diarrhea.  Genitourinary: Negative for dysuria, frequency, hematuria and urgency.  Musculoskeletal: Negative for back pain and neck pain.  Neurological: Negative for dizziness and headaches.  Psychiatric/Behavioral: Negative for depression. The patient is not nervous/anxious.     MEDICATIONS AT HOME:   Prior to Admission medications   Medication Sig Start Date End Date Taking? Authorizing Provider  albuterol (PROVENTIL HFA;VENTOLIN HFA) 108 (90 Base) MCG/ACT inhaler Inhale 2 puffs into the lungs every 4 (four) hours as needed for wheezing or shortness of  breath. 03/01/19   Lada, Satira Anis, MD  albuterol (PROVENTIL) (2.5 MG/3ML) 0.083% nebulizer solution Take 3 mLs (2.5 mg total) by nebulization every 6 (six) hours as needed for wheezing or shortness of breath. 11/28/18   Luvenia Redden, PA-C  fluticasone (FLOVENT HFA) 220 MCG/ACT inhaler Inhale 2 puffs into the lungs 2 (two) times daily. 03/01/19   Arnetha Courser, MD  loratadine (CLARITIN) 10 MG tablet Take 1 tablet (10 mg total) by mouth daily. Patient not taking: Reported on 04/18/2019 03/01/19   Arnetha Courser, MD  ondansetron (ZOFRAN ODT) 4 MG disintegrating tablet Take 1 tablet (4 mg total) by mouth every 6 (six) hours as needed for nausea. 06/29/18   Rexene Agent, CNM  promethazine (PHENERGAN) 25 MG tablet Take 25 mg by mouth as needed for nausea or vomiting.    [provider]  rivaroxaban (XARELTO) 20 MG TABS tablet Take 1 tablet (20 mg total) by mouth daily with supper. 05/04/19   Cammie Sickle, MD  Spacer/Aero-Holding Chambers (AEROCHAMBER PLUS) inhaler Use as instructed 05/08/17   Melynda Ripple, MD  SUMAtriptan [facility]) 20 MG/ACT nasal spray Place 20 mg into the nose every 2 (two) hours as needed for migraine or headache. May repeat in 2 hours if headache persists or recurs.    [provider]      VITAL SIGNS:  Blood pressure 123/84, pulse 77, temperature 98.1 F (36.7 C), temperature source Oral, resp. rate (!) 21, last menstrual period 07/10/2019, SpO2 98 %.  PHYSICAL EXAMINATION:  Physical Exam  GENERAL:  38 y.o.-year-old patient lying in the bed with no acute distress.  EYES: Pupils equal, round, reactive to light and accommodation. No scleral icterus. Extraocular muscles intact.  HEENT: Head atraumatic, normocephalic. Oropharynx and nasopharynx clear.  NECK:  Supple, no jugular venous distention. No thyroid enlargement, no tenderness.  LUNGS: Normal breath sounds bilaterally, no wheezing, rales,rhonchi or crepitation. No use of accessory muscles of  respiration.  CARDIOVASCULAR: RRR, S1, S2 normal. No murmurs, rubs, or gallops.  ABDOMEN: Soft, nondistended. Bowel sounds present. No organomegaly or mass. + Mild tenderness to palpation of the right upper quadrant.  Negative Murphy sign. EXTREMITIES: No pedal edema, cyanosis, or clubbing.  NEUROLOGIC: Cranial nerves II through XII are intact. Muscle strength 5/5 in all extremities. Sensation intact. Gait not checked.  PSYCHIATRIC: The patient is alert and oriented x 3.  SKIN: No obvious rash, lesion, or ulcer.   LABORATORY PANEL:   CBC Recent Labs  Lab 07/26/19 0249  WBC 8.6  HGB 11.8*  HCT 49.3*  PLT 270   ------------------------------------------------------------------------------------------------------------------  Chemistries  Recent Labs  Lab 07/26/19 0249  NA 139  K 3.9  CL 106  CO2 22  GLUCOSE 142*  BUN 15  CREATININE 0.79  CALCIUM 9.1  AST 70*  ALT 36  ALKPHOS 77  BILITOT 0.7   ------------------------------------------------------------------------------------------------------------------  Cardiac Enzymes No results for input(s): TROPONINI in the last 168 hours. ------------------------------------------------------------------------------------------------------------------  RADIOLOGY:  Ct Abdomen Pelvis W Contrast  Result Date: 07/26/2019 CLINICAL DATA:  Abdominal pain, acute generalized EXAM: CT ABDOMEN AND PELVIS WITH CONTRAST TECHNIQUE: Multidetector CT imaging of the abdomen and pelvis was performed using the standard protocol following bolus administration of intravenous contrast. CONTRAST:  122mL OMNIPAQUE IOHEXOL 300 MG/ML  SOLN COMPARISON:  CT 02/15/2007 FINDINGS: Lower chest: Basilar areas of atelectasis. Normal heart size. No pericardial effusion. Hepatobiliary: No focal hepatic abnormality. Mild periportal edema. Few calcified gallstones within the body of the gallbladder. No pericholecystic inflammation. No gallbladder wall thickening. No  biliary ductal dilatation or calcified intraductal gallstones. Pancreas: Unremarkable. No pancreatic ductal dilatation or surrounding inflammatory changes. Spleen: Normal in size without focal abnormality. Adrenals/Urinary Tract: Adrenal glands are unremarkable. Kidneys are normal, without renal calculi, focal lesion, or hydronephrosis. Mild bladder wall thickening though urinary bladder is largely decompressed at the time of exam and therefore poorly evaluated by CT imaging. Stomach/Bowel: Distal esophagus, stomach and duodenal sweep are unremarkable. No bowel wall thickening or dilatation. No evidence of obstruction. A normal appendix is visualized. Vascular/Lymphatic: Normal caliber aorta. No aneurysm or ectasia. No suspicious or enlarged lymph nodes in the included lymphatic chains. Reproductive: There is an enlarged appearance of the anteverted uterus with lobular heterogeneity at the uterine fundus associated with the endometrial stripe and a small hypoattenuating 11 mm structure seen centrally there is mild thickening of the endometrial stripe at the lower uterine segment. Few dominant follicles are noted in the left ovary measuring up to 2.4 cm. No concerning adnexal lesions. Other: No abdominopelvic free fluid or free gas. No bowel containing hernias. Musculoskeletal: Multilevel degenerative changes are present in the imaged portions of the spine. Findings are maximal in the lower lumbar levels L4-S1. No acute osseous abnormality or suspicious osseous lesion. IMPRESSION: 1. Cholelithiasis without CT evidence of acute cholecystitis. Mild periportal edema is nonspecific. 2. Enlarged appearance of the anteverted uterus with lobular heterogeneity at the uterine fundus associated with the endometrial stripe and a small 11 mm hypoattenuating structure centrally there is mild thickening of the endometrial stripe at the lower uterine segment. Findings raise suspicion for early pregnancy versus irregular fibroid  uterus. Further evaluation with pelvic ultrasound as clinically indicated. 3. Mild bladder wall thickening though urinary bladder is largely decompressed at the time of exam and therefore poorly evaluated by CT imaging. Correlate with urinalysis to exclude cystitis. These results were called by telephone at the time of interpretation on 07/26/2019 at 7:01 am to Dr. Marjean Donna , who verbally acknowledged these results. Electronically Signed   By: Lovena Le M.D.   On: 07/26/2019 07:02   US Abdomen Limited Ruq  Result Date: 07/26/2019 CLINICAL DATA:  Initial evaluation for acute right upper quadrant pain, nausea, vomiting. EXAM: ULTRASOUND ABDOMEN LIMITED RIGHT UPPER QUADRANT COMPARISON:  Prior CT from 02/15/2007. FINDINGS: Gallbladder: Several scattered shadowing echogenic stones present within the gallbladder lumen, with a dominant stone position at the gallbladder neck measuring 2.7 cm. Gallbladder wall measure within normal limits at 2.3 mm in thickness. No free pericholecystic fluid. No sonographic Murphy sign elicited on exam. Common bile duct: Diameter: 5.6 mm Liver: No focal lesion identified. Within normal limits in parenchymal echogenicity. Portal vein is patent on color Doppler imaging with normal direction of blood flow towards the liver. Other: None. IMPRESSION: 1. Cholelithiasis with  dominant 2.7 cm stone positioned at the gallbladder neck. No sonographic evidence for acute cholecystitis. 2. No biliary dilatation. Electronically Signed   By: Jeannine Boga M.D.   On: 07/26/2019 04:19      IMPRESSION AND PLAN:   Symptomatic cholelithiasis- right upper quadrant ultrasound with gallstone in the gallbladder neck.  No evidence of cholecystitis. -Surgery consult- plan for lap chole on Friday -Start zosyn -Pain control -IV fluids -IV antiemetics  Uterine mass- transvaginal US with mass within the endometrium.  Differentials include fibroid, polyp, or carcinoma. -Will need to follow-up  with gynecology as an outpatient for sonohysterogram  Hyperglycemia-blood sugar elevated in the ED -Check A1c  Elevated AST-likely due to above -IV fluids -Recheck CMP in the morning  Macrocytic anemia- recent ferritin, iron, TIBC were unremarkable. -Check vitamin B12 and folate  Factor V Leiden deficiency with history of DVT- on xarelto at home -Will order heparin gtt, plan to stop this on the morning of her surgery  All the records are reviewed and case discussed with ED provider. Management plans discussed with the patient, family and they are in agreement.  CODE STATUS: Full  TOTAL TIME TAKING CARE OF THIS PATIENT: 45 minutes.    Nuala Alpha M.D on 07/26/2019 at 9:03 AM  Between 7am to 6pm - Pager - [phone]  After 6pm go to [email or web address] - Proofreader  [employer]  Office  [phone]  CC: Primary care physician; Hubbard Hartshorn, FNP   Note: This dictation was prepared with Dragon dictation along with smaller phrase technology. Any transcriptional errors that result from this process are unintentional.

## 2019-07-26 NOTE — Consult Note (Signed)
[facility] SURGICAL CONSULTATION NOTE (initial) - [id number]   HISTORY OF PRESENT ILLNESS (HPI):  38 y.o. female presented to [facility] ED today for evaluation of abdominal pain. Patient reports the acute onset of epigastric and RUQ abdominal pain which radiates through to her back yesterday evening around 11 pm. The pain is sharp, crampy, and severe in nature. Nothing seems to make the pain better. No association with food. No history of similar pain. She has associated nausea and emesis with the pain. No fever, chills, cough, congestion, CP, SOB, jaundice, icterus, bowel changes, or urinary changes. No previous abdominal surgeries. Work up in the ED was concerning for cholelithiasis without evidence of cholecystitis and possible uterine fibroids. She was admitted to medicine service.   Surgery is consulted by hospitalist physician Dr. Sela Hua, MD in this context for evaluation and management of cholelithiasis.  Of note, she does have a history of Factor V Leiden and unprovoked DVT on Xarelto. She last took this yesterday (09/01) at 6 pm.    PAST MEDICAL HISTORY (PMH):  Past Medical History:  Diagnosis Date  . Asthma   . Bone spur   . DVT (deep venous thrombosis) ([place])   . Factor 5 Leiden mutation, heterozygous ([place])   . Fibroid   . IDA (iron deficiency anemia)   . Migraines   . PCOS (polycystic ovarian syndrome)      PAST SURGICAL HISTORY ([place]):  Past Surgical History:  Procedure Laterality Date  . MYOMECTOMY    . TONSILLECTOMY    . WISDOM TOOTH EXTRACTION    . WRIST SURGERY     age 73 - rebroke it b/c it grew back wrong     MEDICATIONS:  Prior to Admission medications   Medication Sig Start Date End Date Taking? Authorizing Provider  albuterol (PROVENTIL HFA;VENTOLIN HFA) 108 (90 Base) MCG/ACT inhaler Inhale 2 puffs into the lungs every 4 (four) hours as needed for wheezing or shortness of breath. 03/01/19  Yes Lada, Satira Anis, MD  albuterol (PROVENTIL) (2.5  MG/3ML) 0.083% nebulizer solution Take 3 mLs (2.5 mg total) by nebulization every 6 (six) hours as needed for wheezing or shortness of breath. 11/28/18  Yes Luvenia Redden, PA-C  ferrous sulfate 325 (65 FE) MG tablet Take 325 mg by mouth daily.   Yes [provider]  fluticasone (FLOVENT HFA) 220 MCG/ACT inhaler Inhale 2 puffs into the lungs 2 (two) times daily. 03/01/19  Yes Lada, Satira Anis, MD  Multiple Vitamin (MULTIVITAMIN WITH MINERALS) TABS tablet Take 1 tablet by mouth daily.   Yes [provider]  ondansetron (ZOFRAN ODT) 4 MG disintegrating tablet Take 1 tablet (4 mg total) by mouth every 6 (six) hours as needed for nausea. 06/29/18  Yes Rexene Agent, CNM  promethazine (PHENERGAN) 25 MG tablet Take 25 mg by mouth as needed for nausea or vomiting.   Yes [provider]  rivaroxaban (XARELTO) 20 MG TABS tablet Take 1 tablet (20 mg total) by mouth daily with supper. 05/04/19  Yes Cammie Sickle, MD  Spacer/Aero-Holding Chambers (AEROCHAMBER PLUS) inhaler Use as instructed 05/08/17  Yes Melynda Ripple, MD  vitamin C (ASCORBIC ACID) 500 MG tablet Take 500 mg by mouth daily.   Yes [provider]  loratadine (CLARITIN) 10 MG tablet Take 1 tablet (10 mg total) by mouth daily. Patient not taking: Reported on 04/18/2019 03/01/19   Arnetha Courser, MD     ALLERGIES:  No Known Allergies   SOCIAL HISTORY:  Social History   Socioeconomic History  . Marital status: Married    Spouse name: Not on file  . Number of children: 0  . Years of education: 57  . Highest education level: Not on file  Occupational History  . Occupation: Therapist, sports    Comment: ED  Social Needs  . Financial resource strain: Not hard at all  . Food insecurity    Worry: Never true    Inability: Never true  . Transportation needs    Medical: No    Non-medical: No  Tobacco Use  . Smoking status: Never Smoker  . Smokeless tobacco: Never Used  Substance and Sexual Activity  .  Alcohol use: Yes    Comment: occ  . Drug use: No  . Sexual activity: Yes    Birth control/protection: Condom  Lifestyle  . Physical activity    Days per week: 0 days    Minutes per session: Not on file  . Stress: Not at all  Relationships  . Social connections    Talks on phone: More than three times a week    Gets together: Once a week    Attends religious service: 1 to 4 times per year    Active member of club or organization: No    Attends meetings of clubs or organizations: Never    Relationship status: Married  . Intimate partner violence    Fear of current or ex partner: No    Emotionally abused: No    Physically abused: No    Forced sexual activity: No  Other Topics Concern  . 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  No children.         FAMILY HISTORY:  Family History  Problem Relation Age of Onset  . Heart murmur Mother   . Thyroid disease Mother   . Migraines Mother   . Asthma Mother   . Diabetes Mother        TYPE 2  . Bipolar disorder Mother   . Breast cancer Maternal Aunt 7  . Cancer Maternal Uncle        skin  . Cancer Maternal Grandmother 42       colon  . Hypertension Maternal Grandmother   . Cancer Maternal Aunt 50       colon  . Diabetes Maternal Aunt       REVIEW OF SYSTEMS:  Review of Systems  Constitutional: Negative for chills, fever, malaise/fatigue and weight loss.  HENT: Negative for congestion and sore throat.   Respiratory: Negative for cough and shortness of breath.   Cardiovascular: Negative for chest pain and palpitations.  Gastrointestinal: Positive for abdominal pain, nausea and vomiting. Negative for constipation and diarrhea.  Genitourinary: Negative for dysuria and urgency.  Neurological: Negative for dizziness and headaches.  All other systems reviewed and are negative.   VITAL SIGNS:  Temp:  [98.1 F (36.7 C)] 98.1 F (36.7 C) (09/02 0235) Pulse Rate:  [73-92] 77  (09/02 0730) Resp:  [11-22] 21 (09/02 0700) BP: (106-127)/(65-88) 123/84 (09/02 0730) SpO2:  [94 %-100 %] 98 % (09/02 0730)             INTAKE/OUTPUT:  No intake/output data recorded.  PHYSICAL EXAM:  Physical Exam Vitals signs and nursing note reviewed. Exam conducted with a chaperone present.  Constitutional:      General: She is not in acute distress.    Appearance: She is well-developed. She  is obese. She is not ill-appearing.  HENT:     Head: Normocephalic and atraumatic.  Eyes:     General: No scleral icterus.    Extraocular Movements: Extraocular movements intact.  Cardiovascular:     Rate and Rhythm: Normal rate and regular rhythm.     Heart sounds: Normal heart sounds. No murmur. No friction rub. No gallop.   Pulmonary:     Effort: Pulmonary effort is normal. No respiratory distress.     Breath sounds: Normal breath sounds. No wheezing or rhonchi.  Abdominal:     General: Abdomen is protuberant. There is no distension.     Palpations: Abdomen is soft.     Tenderness: There is abdominal tenderness in the right upper quadrant and epigastric area. There is no guarding or rebound. Negative signs include Murphy's sign.     Hernia: No hernia is present.  Genitourinary:    Comments: deferred Skin:    General: Skin is warm and dry.     Coloration: Skin is not jaundiced.  Neurological:     General: No focal deficit present.     Mental Status: She is alert and oriented to person, place, and time.  Psychiatric:        Mood and Affect: Mood normal.        Behavior: Behavior normal.      Labs:  CBC Latest Ref Rng & Units 07/26/2019 07/19/2019 06/20/2019  WBC 4.0 - 10.5 K/uL 8.6 5.1 5.7  Hemoglobin 12.0 - 15.0 g/dL 11.8(L) 10.4(L) 7.6(L)  Hematocrit 36.0 - 46.0 % 49.3(H) 34.7(L) 25.4(L)  Platelets 150 - 400 K/uL 270 273 298   CMP Latest Ref Rng & Units 07/26/2019 07/19/2019 08/02/2018  Glucose 70 - 99 mg/dL 142(H) 110(H) 95  BUN 6 - 20 mg/dL 15 11 13   Creatinine 0.44 - 1.00  mg/dL 0.79 0.70 0.79  Sodium 135 - 145 mmol/L 139 138 137  Potassium 3.5 - 5.1 mmol/L 3.9 3.7 4.0  Chloride 98 - 111 mmol/L 106 106 106  CO2 22 - 32 mmol/L 22 25 23   Calcium 8.9 - 10.3 mg/dL 9.1 8.9 9.1  Total Protein 6.5 - 8.1 g/dL 7.3 6.9 7.0  Total Bilirubin 0.3 - 1.2 mg/dL 0.7 0.4 0.3  Alkaline Phos 38 - 126 U/L 77 50 -  AST 15 - 41 U/L 70(H) 15 13  ALT 0 - 44 U/L 36 14 10    Imaging studies:   RUQ [place] (07/26/2019) personally reviewed which does show cholelithiasis, and radiologist report reviewed:  IMPRESSION: 1. Cholelithiasis with dominant 2.7 cm stone positioned at the gallbladder neck. No sonographic evidence for acute cholecystitis. 2. No biliary dilatation.   CT Abdomen/Pelvis personally reviewed which showed cholelithiasis and stone near gallbladder neck without appreciable peri-cholecystic edema, and radiologist report reviewed:  IMPRESSION: 1. Cholelithiasis without CT evidence of acute cholecystitis. Mild periportal edema is nonspecific. 2. Enlarged appearance of the anteverted uterus with lobular heterogeneity at the uterine fundus associated with the endometrial stripe and a small 11 mm hypoattenuating structure centrally there is mild thickening of the endometrial stripe at the lower uterine segment. Findings raise suspicion for early pregnancy versus irregular fibroid uterus. Further evaluation with pelvic ultrasound as clinically indicated. 3. Mild bladder wall thickening though urinary bladder is largely decompressed at the time of exam and therefore poorly evaluated by CT imaging. Correlate with urinalysis to exclude cystitis.    Assessment/Plan: (ICD-10's: K7.20) 38 y.o. female with RUQ abdominal pain and nausea/emesis most likely attributable to symptomatic  cholelithiasis without radiographic evidence of acute cholecystits, complicated by pertinent comorbidities including history of Factor V Leiden and unprovoked DVT on DVT.   - Diet as tolerated; NPO  if still having significant pain, nausea, or emesis  - IVF  - Start ABx (Zosyn)   - pain control prn; antiemetics prn  - monitor abdominal pain  - Patient will benefit from laparoscopic cholecystectomy given significance of symptoms. Due to her anticoagulation status, this will likely be done on Friday (09/04) with Dr Dahlia Byes pending OR/Anesthesia availability to allow Xarelto to get out of her system  - further management per primary team  - DVT prophylaxis; will likely need to be on heparin drip; will stop morning of surgery  All of the above findings and recommendations were discussed with the patient, and all of patient's questions were answered to her expressed satisfaction.  Thank you for the opportunity to participate in this patient's care.   -- Edison Simon, PA-C [facility] 07/26/2019, 9:51 AM [phone] M-F: 7am - 4pm

## 2019-07-26 NOTE — Consult Note (Signed)
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   Medical History: Past Medical History:  Diagnosis Date  . Asthma   . Bone spur   . DVT (deep venous thrombosis) ([place])   . Factor 5 Leiden mutation, heterozygous ([place])   . Fibroid   . IDA (iron deficiency anemia)   . Migraines   . PCOS (polycystic ovarian syndrome)     Medications:  (Not in a hospital admission)  Scheduled:   Infusions:  . sodium chloride 100 mL/hr at 07/26/19 1215  . 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 Pt on rivaroxaban PTA. Surgery plan for 9/4.   Goal of Therapy:  Heparin level 0.3-0.7 units/ml, when aPTT and HL correlate.  aPTT 66-102 seconds Monitor platelets by anticoagulation protocol: Yes   Plan:  Give 4500 units bolus x 1 Start heparin infusion at 1350 units/hr Check anti-Xa level in 6 hours and daily while on heparin Continue to monitor H&H and platelets  Oswald Hillock, PharmD, BCPS 07/26/2019,3:36 PM

## 2019-07-27 ENCOUNTER — Encounter: Payer: Self-pay | Admitting: Anesthesiology

## 2019-07-27 ENCOUNTER — Encounter: Payer: Self-pay | Admitting: Internal Medicine

## 2019-07-27 DIAGNOSIS — R1011 Right upper quadrant pain: Secondary | ICD-10-CM

## 2019-07-27 LAB — CBC WITH DIFFERENTIAL/PLATELET
Abs Immature Granulocytes: 0.04 10*3/uL (ref 0.00–0.07)
Basophils Absolute: 0 10*3/uL (ref 0.0–0.1)
Basophils Relative: 0 %
Eosinophils Absolute: 0.1 10*3/uL (ref 0.0–0.5)
Eosinophils Relative: 1 %
HCT: 40.4 % (ref 36.0–46.0)
Hemoglobin: 11.8 g/dL — ABNORMAL LOW (ref 12.0–15.0)
Immature Granulocytes: 1 %
Lymphocytes Relative: 18 %
Lymphs Abs: 1.6 10*3/uL (ref 0.7–4.0)
MCH: 25.6 pg — ABNORMAL LOW (ref 26.0–34.0)
MCHC: 23.9 g/dL — ABNORMAL LOW (ref 30.0–36.0)
MCV: 86.5 fL (ref 80.0–100.0)
Monocytes Absolute: 0.4 10*3/uL (ref 0.1–1.0)
Monocytes Relative: 5 %
Neutro Abs: 6.5 10*3/uL (ref 1.7–7.7)
Neutrophils Relative %: 75 %
Platelets: 270 10*3/uL (ref 150–400)
RBC: 4.61 MIL/uL (ref 3.87–5.11)
RDW: 18.7 % — ABNORMAL HIGH (ref 11.5–15.5)
WBC: 8.6 10*3/uL (ref 4.0–10.5)
nRBC: 0 % (ref 0.0–0.2)

## 2019-07-27 LAB — CBC
HCT: 33.8 % — ABNORMAL LOW (ref 36.0–46.0)
Hemoglobin: 10.2 g/dL — ABNORMAL LOW (ref 12.0–15.0)
MCH: 25.4 pg — ABNORMAL LOW (ref 26.0–34.0)
MCHC: 30.2 g/dL (ref 30.0–36.0)
MCV: 84.3 fL (ref 80.0–100.0)
Platelets: 225 10*3/uL (ref 150–400)
RBC: 4.01 MIL/uL (ref 3.87–5.11)
RDW: 20.8 % — ABNORMAL HIGH (ref 11.5–15.5)
WBC: 4.5 10*3/uL (ref 4.0–10.5)
nRBC: 0 % (ref 0.0–0.2)

## 2019-07-27 LAB — HEPARIN LEVEL (UNFRACTIONATED): Heparin Unfractionated: 0.32 IU/mL (ref 0.30–0.70)

## 2019-07-27 LAB — COMPREHENSIVE METABOLIC PANEL
ALT: 47 U/L — ABNORMAL HIGH (ref 0–44)
AST: 38 U/L (ref 15–41)
Albumin: 3.3 g/dL — ABNORMAL LOW (ref 3.5–5.0)
Alkaline Phosphatase: 58 U/L (ref 38–126)
Anion gap: 7 (ref 5–15)
BUN: 12 mg/dL (ref 6–20)
CO2: 25 mmol/L (ref 22–32)
Calcium: 8.4 mg/dL — ABNORMAL LOW (ref 8.9–10.3)
Chloride: 108 mmol/L (ref 98–111)
Creatinine, Ser: 0.73 mg/dL (ref 0.44–1.00)
GFR calc Af Amer: >60 mL/min (ref >60)
GFR calc non Af Amer: >60 mL/min (ref >60)
Glucose, Bld: 90 mg/dL (ref 70–99)
Potassium: 3.7 mmol/L (ref 3.5–5.1)
Sodium: 140 mmol/L (ref 135–145)
Total Bilirubin: 0.4 mg/dL (ref 0.3–1.2)
Total Protein: 6.1 g/dL — ABNORMAL LOW (ref 6.5–8.1)

## 2019-07-27 LAB — VITAMIN B12: Vitamin B-12: 171 pg/mL — ABNORMAL LOW (ref 180–914)

## 2019-07-27 LAB — FOLATE: Folate: 14.6 ng/mL (ref >5.9)

## 2019-07-27 NOTE — Progress Notes (Signed)
Lab Press photographer. Lab correction noted due to aspirate in analyzer malfunction. Lab draws 9/2 0413 Hct 40.4 and MCV 86.5. MD Dr. Brett Albino notified.

## 2019-07-27 NOTE — Progress Notes (Signed)
[facility] for Heparin  Indication: factor 5 leiden deficiency with recurrent DVT   No Known Allergies  Patient Measurements: Height: 5\' 8"  (172.7 cm) Weight: (!) 317 lb 5.4 oz (143.9 kg) IBW/kg (Calculated) : 63.9 Heparin Dosing Weight: 98.7 kg   Vital Signs: Temp: 98.1 F (36.7 C) (09/03 0522) Temp Source: Oral (09/03 0522) BP: 91/63 (09/03 0522) Pulse Rate: 81 (09/03 0522)  Labs: Recent Labs    07/26/19 0249 07/26/19 2019 07/27/19 0517  HGB 11.8*  --   --   HCT 49.3*  --   --   PLT 270  --   --   APTT 33 67*  --   LABPROT 15.9* 14.1  --   INR 1.3* 1.1  --   HEPARINUNFRC  --  0.68 0.32  CREATININE 0.79  --   --   TROPONINIHS <2  --   --     Estimated Creatinine Clearance: 144.3 mL/min (by C-G formula based on SCr of 0.79 mg/dL).   Medical History: Past Medical History:  Diagnosis Date  . Asthma   . Bone spur   . DVT (deep venous thrombosis) ([place])   . Factor 5 Leiden mutation, heterozygous ([place])   . Fibroid   . IDA (iron deficiency anemia)   . Migraines   . PCOS (polycystic ovarian syndrome)     Medications:  Medications Prior to Admission  Medication Sig Dispense Refill Last Dose  . albuterol (PROVENTIL HFA;VENTOLIN HFA) 108 (90 Base) MCG/ACT inhaler Inhale 2 puffs into the lungs every 4 (four) hours as needed for wheezing or shortness of breath. 1 Inhaler 2 Unknown at PRN  . albuterol (PROVENTIL) (2.5 MG/3ML) 0.083% nebulizer solution Take 3 mLs (2.5 mg total) by nebulization every 6 (six) hours as needed for wheezing or shortness of breath. 75 mL 12 Unknown at PRN  . ferrous sulfate 325 (65 FE) MG tablet Take 325 mg by mouth daily.   07/25/2019 at 1900  . fluticasone (FLOVENT HFA) 220 MCG/ACT inhaler Inhale 2 puffs into the lungs 2 (two) times daily. 1 Inhaler 12 07/25/2019 at Unknown time  . Multiple Vitamin (MULTIVITAMIN WITH MINERALS) TABS tablet Take 1 tablet by mouth daily.   07/25/2019 at 1900  . ondansetron (ZOFRAN  ODT) 4 MG disintegrating tablet Take 1 tablet (4 mg total) by mouth every 6 (six) hours as needed for nausea. 20 tablet 3 Unknown at PRN  . promethazine (PHENERGAN) 25 MG tablet Take 25 mg by mouth as needed for nausea or vomiting.   Unknown at PRN  . rivaroxaban (XARELTO) 20 MG TABS tablet Take 1 tablet (20 mg total) by mouth daily with supper. 30 tablet 3 07/25/2019 at 1900  . Spacer/Aero-Holding Chambers (AEROCHAMBER PLUS) inhaler Use as instructed 1 each 2   . vitamin C (ASCORBIC ACID) 500 MG tablet Take 500 mg by mouth daily.   07/25/2019 at 1900  . loratadine (CLARITIN) 10 MG tablet Take 1 tablet (10 mg total) by mouth daily. 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 Pt on rivaroxaban PTA. Surgery plan for 9/4.   9/2 @ 2019:  HL = 0.68, aPTT = 67 9/3 @ 0517 HL = 0.32, therapeutic x 2 Now that aPTT and HL are both therapeutic, can use HL to guide dosing.   Goal of Therapy:  Heparin level 0.3-0.7 units/ml Monitor platelets  by anticoagulation protocol: Yes   Plan:  Continue heparin infusion at 1350 units/hr Check heparin level and CBC daily while on heparin Continue to monitor H&H and platelets  [place] Crane, Leeman Johnsey A 07/27/2019,6:18 AM

## 2019-07-27 NOTE — Progress Notes (Signed)
[facility] NAME: Tara Shea    MR#:  [id number]  DATE OF BIRTH:  1981-10-20  SUBJECTIVE:   Denies any abdominal pain this morning.  Endorses nausea.  No vomiting.  States she is unable to keep anything down, as her nausea gets much worse after eating or drinking.  REVIEW OF SYSTEMS:  Review of Systems  Constitutional: Negative for chills and fever.  HENT: Negative for congestion and sore throat.   Eyes: Negative for blurred vision and double vision.  Respiratory: Negative for cough and shortness of breath.   Cardiovascular: Negative for chest pain and palpitations.  Gastrointestinal: Positive for nausea. Negative for abdominal pain and vomiting.  Genitourinary: Negative for dysuria and urgency.  Musculoskeletal: Negative for back pain and neck pain.  Neurological: Negative for dizziness and headaches.  Psychiatric/Behavioral: Negative for depression. The patient is not nervous/anxious.     DRUG ALLERGIES:  No Known Allergies VITALS:  Blood pressure (!) 92/55, pulse 70, temperature 98.3 F (36.8 C), temperature source Oral, resp. rate 16, height 5\' 8"  (1.727 m), weight (!) 143.9 kg, last menstrual period 07/10/2019, SpO2 100 %. PHYSICAL EXAMINATION:  Physical Exam  GENERAL:   Sitting up in the bed with no acute distress.  HEENT: Head atraumatic, normocephalic. Pupils equal, round, reactive to light and accommodation. No scleral icterus. Extraocular muscles intact. Oropharynx and nasopharynx clear.  NECK:  Supple, no jugular venous distention. No thyroid enlargement. LUNGS: Lungs are clear to auscultation bilaterally. No wheezes, crackles, rhonchi. No use of accessory muscles of respiration.  CARDIOVASCULAR: RRR, S1, S2 normal. No murmurs, rubs, or gallops.  ABDOMEN: Soft, nondistended. Bowel sounds present. + Right upper quadrant pain present.  No rebound or guarding. EXTREMITIES: No pedal edema, cyanosis, or  clubbing.  NEUROLOGIC: CN 2-12 intact, no focal deficits. 5/5 muscle strength throughout all extremities. Sensation intact throughout. Gait not checked.  PSYCHIATRIC: The patient is alert and oriented x 3.  SKIN: No obvious rash, lesion, or ulcer.  LABORATORY PANEL:  Female CBC Recent Labs  Lab 07/27/19 0517  WBC 4.5  HGB 10.2*  HCT 33.8*  PLT 225   ------------------------------------------------------------------------------------------------------------------ Chemistries  Recent Labs  Lab 07/27/19 0517  NA 140  K 3.7  CL 108  CO2 25  GLUCOSE 90  BUN 12  CREATININE 0.73  CALCIUM 8.4*  AST 38  ALT 47*  ALKPHOS 58  BILITOT 0.4   RADIOLOGY:  No results found. ASSESSMENT AND PLAN:   Symptomatic cholelithiasis- right upper quadrant ultrasound with gallstone in the gallbladder neck.  No evidence of cholecystitis. -Surgery consult- plan for laparoscopic cholecystectomy tomorrow morning -Continue Zosyn -Pain control -IV fluids -IV antiemetics -Will make patient n.p.o. at midnight  Uterine mass- transvaginal US with mass within the endometrium.  Differentials include fibroid, polyp, or carcinoma. -Will need to follow-up with gynecology as an outpatient for sonohysterogram  Hyperglycemia-blood sugar elevated in the ED -Check A1c  Macrocytic anemia- recent ferritin, iron, TIBC were unremarkable. -Vitamin B12 and folate pending  Factor V Leiden deficiency with history of DVT- on xarelto at home -Continue heparin drip- plan to stop this at midnight  All the records are reviewed and case discussed with Care Management/Social Worker. Management plans discussed with the patient, family and they are in agreement.  CODE STATUS: Full Code  TOTAL TIME TAKING CARE OF THIS PATIENT: 40 minutes.   More than 50% of the time was spent in counseling/coordination of care: YES  POSSIBLE D/C  IN 2-3 DAYS, DEPENDING ON CLINICAL CONDITION.   Berna Spare Mayo M.D on 07/27/2019 at  11:23 AM  Between 7am to 6pm - Pager - [phone]  After 6pm go to [email or web address] - Proofreader  [facility]  Office  [phone]  CC: Primary care physician; Hubbard Hartshorn, FNP  Note: This dictation was prepared with Dragon dictation along with smaller phrase technology. Any transcriptional errors that result from this process are unintentional.

## 2019-07-27 NOTE — Telephone Encounter (Signed)
Spoke to patient regarding recent admission for acute cholecystitis needing cholecystectomy.  Agree with the plan of IV heparin/transitioning to Xarelto after surgery.  With regards to findings noted on the CT scan/pelvic ultrasound-recommend evaluation with gynecology at discharge.

## 2019-07-27 NOTE — Anesthesia Preprocedure Evaluation (Addendum)
Anesthesia Evaluation  Patient identified by MRN, date of birth, ID band Patient awake    Reviewed: Allergy & Precautions, NPO status , Patient's Chart, lab work & pertinent test results  Airway Mallampati: III       Dental   Pulmonary asthma ,    Pulmonary exam normal        Cardiovascular + DVT  Normal cardiovascular exam     Neuro/Psych  Headaches, negative psych ROS   GI/Hepatic Neg liver ROS,   Endo/Other    Renal/GU stone     Musculoskeletal  (+) Arthritis , Osteoarthritis,    Abdominal Normal abdominal exam  (+)   Peds  Hematology  (+) Blood dyscrasia, anemia ,   Anesthesia Other Findings Past Medical History: No date: Asthma No date: Bone spur No date: DVT (deep venous thrombosis) ([facility]) No date: Factor 5 Leiden mutation, heterozygous ([place]) No date: Fibroid No date: IDA (iron deficiency anemia) No date: Migraines No date: PCOS (polycystic ovarian syndrome)  Reproductive/Obstetrics negative OB ROS                            Anesthesia Physical Anesthesia Plan  ASA: III  Anesthesia Plan: General   Post-op Pain Management:    Induction: Intravenous  PONV Risk Score and Plan:   Airway Management Planned: Oral ETT  Additional Equipment:   Intra-op Plan:   Post-operative Plan: Extubation in OR  Informed Consent: I have reviewed the patients History and Physical, chart, labs and discussed the procedure including the risks, benefits and alternatives for the proposed anesthesia with the patient or authorized representative who has indicated his/her understanding and acceptance.     Dental advisory given  Plan Discussed with: CRNA and Surgeon  Anesthesia Plan Comments:         Anesthesia Quick Evaluation

## 2019-07-27 NOTE — Progress Notes (Signed)
CC: Cholecystitis Subjective: Mild Improvement Some nausea. VSS  Objective: Vital signs in last 24 hours: Temp:  [98.1 F (36.7 C)-98.3 F (36.8 C)] 98.3 F (36.8 C) (09/03 0837) Pulse Rate:  [70-81] 70 (09/03 0837) Resp:  [16-18] 16 (09/03 0837) BP: (91-132)/(55-78) 92/55 (09/03 0837) SpO2:  [97 %-100 %] 100 % (09/03 0837) Weight:  [143.9 kg] 143.9 kg (09/02 2025)    Intake/Output from previous day: 09/02 0701 - 09/03 0700 In: 100 [IV Piggyback:100] Out: 500 [Urine:500] Intake/Output this shift: No intake/output data recorded.  Physical exam: NAD Abd: soft, mild TTP RUQ, no peritonitis Ext: no edema, well perfused  Lab Results: CBC  Recent Labs    07/26/19 0249 07/27/19 0517  WBC 8.6 4.5  HGB 11.8* 10.2*  HCT 40.4 33.8*  PLT 270 225   BMET Recent Labs    07/26/19 0249 07/27/19 0517  NA 139 140  K 3.9 3.7  CL 106 108  CO2 22 25  GLUCOSE 142* 90  BUN 15 12  CREATININE 0.79 0.73  CALCIUM 9.1 8.4*   PT/INR Recent Labs    07/26/19 0249 07/26/19 2019  LABPROT 15.9* 14.1  INR 1.3* 1.1   ABG No results for input(s): PHART, HCO3 in the last 72 hours.  Invalid input(s): PCO2, PO2  Studies/Results: Ct Abdomen Pelvis W Contrast  Result Date: 07/26/2019 CLINICAL DATA:  Abdominal pain, acute generalized EXAM: CT ABDOMEN AND PELVIS WITH CONTRAST TECHNIQUE: Multidetector CT imaging of the abdomen and pelvis was performed using the standard protocol following bolus administration of intravenous contrast. CONTRAST:  111mL OMNIPAQUE IOHEXOL 300 MG/ML  SOLN COMPARISON:  CT 02/15/2007 FINDINGS: Lower chest: Basilar areas of atelectasis. Normal heart size. No pericardial effusion. Hepatobiliary: No focal hepatic abnormality. Mild periportal edema. Few calcified gallstones within the body of the gallbladder. No pericholecystic inflammation. No gallbladder wall thickening. No biliary ductal dilatation or calcified intraductal gallstones. Pancreas: Unremarkable. No  pancreatic ductal dilatation or surrounding inflammatory changes. Spleen: Normal in size without focal abnormality. Adrenals/Urinary Tract: Adrenal glands are unremarkable. Kidneys are normal, without renal calculi, focal lesion, or hydronephrosis. Mild bladder wall thickening though urinary bladder is largely decompressed at the time of exam and therefore poorly evaluated by CT imaging. Stomach/Bowel: Distal esophagus, stomach and duodenal sweep are unremarkable. No bowel wall thickening or dilatation. No evidence of obstruction. A normal appendix is visualized. Vascular/Lymphatic: Normal caliber aorta. No aneurysm or ectasia. No suspicious or enlarged lymph nodes in the included lymphatic chains. Reproductive: There is an enlarged appearance of the anteverted uterus with lobular heterogeneity at the uterine fundus associated with the endometrial stripe and a small hypoattenuating 11 mm structure seen centrally there is mild thickening of the endometrial stripe at the lower uterine segment. Few dominant follicles are noted in the left ovary measuring up to 2.4 cm. No concerning adnexal lesions. Other: No abdominopelvic free fluid or free gas. No bowel containing hernias. Musculoskeletal: Multilevel degenerative changes are present in the imaged portions of the spine. Findings are maximal in the lower lumbar levels L4-S1. No acute osseous abnormality or suspicious osseous lesion. IMPRESSION: 1. Cholelithiasis without CT evidence of acute cholecystitis. Mild periportal edema is nonspecific. 2. Enlarged appearance of the anteverted uterus with lobular heterogeneity at the uterine fundus associated with the endometrial stripe and a small 11 mm hypoattenuating structure centrally there is mild thickening of the endometrial stripe at the lower uterine segment. Findings raise suspicion for early pregnancy versus irregular fibroid uterus. Further evaluation with pelvic ultrasound as clinically indicated.  3. Mild bladder  wall thickening though urinary bladder is largely decompressed at the time of exam and therefore poorly evaluated by CT imaging. Correlate with urinalysis to exclude cystitis. These results were called by telephone at the time of interpretation on 07/26/2019 at 7:01 am to Dr. Marjean Donna , who verbally acknowledged these results. Electronically Signed   By: Lovena Le M.D.   On: 07/26/2019 07:02   US Pelvic Complete With Transvaginal  Result Date: 07/26/2019 CLINICAL DATA:  Vomiting.  Follow-up abnormal CT from 07/26/2019. EXAM: TRANSABDOMINAL AND TRANSVAGINAL ULTRASOUND OF PELVIS TECHNIQUE: Both transabdominal and transvaginal ultrasound examinations of the pelvis were performed. Transabdominal technique was performed for global imaging of the pelvis including uterus, ovaries, adnexal regions, and pelvic cul-de-sac. It was necessary to proceed with endovaginal exam following the transabdominal exam to visualize the uterus and endometrium. COMPARISON:  CT 07/26/2019 FINDINGS: Uterus Measurements: 14.0 by 8.3 by 8.7 cm. = volume: 524 mL. Anteverted. Within the lower uterine segment there is a left anterior myometrial fibroid measuring 1.4 x 1 0.3/2.0 cm. Endometrium Thickness: 47.3 mm. There is a heterogeneous, predominantly solid mass within the endometrium which measures 5.3 x 3.7 x 4.1 cm. There is increased blood flow within this mass. There are also several small internal cystic areas. Right ovary Not visualized. Left ovary Measurements: 2.9 x 3.4 x 2.3 cm = volume: 12 mL. Normal appearance/no adnexal mass. Other findings Small volume of free fluid noted. IMPRESSION: 1. There is a mass identified within the endometrium with a maximum dimension of 5.3 cm. Primary differential considerations include sub mucosal fibroid, endometrial polyp, or carcinoma. Consider sonohysterogram for further evaluation, prior to hysteroscopy or endometrial biopsy. Electronically Signed   By: Kerby Moors M.D.   On: 07/26/2019  09:18   US Abdomen Limited Ruq  Result Date: 07/26/2019 CLINICAL DATA:  Initial evaluation for acute right upper quadrant pain, nausea, vomiting. EXAM: ULTRASOUND ABDOMEN LIMITED RIGHT UPPER QUADRANT COMPARISON:  Prior CT from 02/15/2007. FINDINGS: Gallbladder: Several scattered shadowing echogenic stones present within the gallbladder lumen, with a dominant stone position at the gallbladder neck measuring 2.7 cm. Gallbladder wall measure within normal limits at 2.3 mm in thickness. No free pericholecystic fluid. No sonographic Murphy sign elicited on exam. Common bile duct: Diameter: 5.6 mm Liver: No focal lesion identified. Within normal limits in parenchymal echogenicity. Portal vein is patent on color Doppler imaging with normal direction of blood flow towards the liver. Other: None. IMPRESSION: 1. Cholelithiasis with dominant 2.7 cm stone positioned at the gallbladder neck. No sonographic evidence for acute cholecystitis. 2. No biliary dilatation. Electronically Signed   By: Jeannine Boga M.D.   On: 07/26/2019 04:19    Anti-infectives: Anti-infectives (From admission, onward)   Start     Dose/Rate Route Frequency Ordered Stop   07/26/19 1400  piperacillin-tazobactam (ZOSYN) IVPB 3.375 g     3.375 g 12.5 mL/hr over 240 Minutes Intravenous Every 8 hours 07/26/19 1210        Assessment/Plan:  Cholecystitis Plan for lap chole in am Stop heparin MN  Caroleen Hamman, MD, FACS  07/27/2019

## 2019-07-27 NOTE — Progress Notes (Signed)
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   No Known Allergies  Patient Measurements: Height: 5\' 8"  (172.7 cm) Weight: (!) 317 lb 5.4 oz (143.9 kg) IBW/kg (Calculated) : 63.9 Heparin Dosing Weight: 98.7 kg   Vital Signs: Temp: 98.5 F (36.9 C) (09/03 1131) Temp Source: Oral (09/03 1131) BP: 103/65 (09/03 1131) Pulse Rate: 77 (09/03 1131)  Labs: Recent Labs    07/26/19 0249 07/26/19 2019 07/27/19 0517  HGB 11.8*  --  10.2*  HCT 40.4  --  33.8*  PLT 270  --  225  APTT 33 67*  --   LABPROT 15.9* 14.1  --   INR 1.3* 1.1  --   HEPARINUNFRC  --  0.68 0.32  CREATININE 0.79  --  0.73  TROPONINIHS <2  --   --     Estimated Creatinine Clearance: 144.3 mL/min (by C-G formula based on SCr of 0.73 mg/dL).   Medical History: Past Medical History:  Diagnosis Date  . Asthma   . Bone spur   . DVT (deep venous thrombosis) ([place])   . Factor 5 Leiden mutation, heterozygous ([place])   . Fibroid   . IDA (iron deficiency anemia)   . Migraines   . PCOS (polycystic ovarian syndrome)     Medications:  Medications Prior to Admission  Medication Sig Dispense Refill Last Dose  . albuterol (PROVENTIL HFA;VENTOLIN HFA) 108 (90 Base) MCG/ACT inhaler Inhale 2 puffs into the lungs every 4 (four) hours as needed for wheezing or shortness of breath. 1 Inhaler 2 Unknown at PRN  . albuterol (PROVENTIL) (2.5 MG/3ML) 0.083% nebulizer solution Take 3 mLs (2.5 mg total) by nebulization every 6 (six) hours as needed for wheezing or shortness of breath. 75 mL 12 Unknown at PRN  . ferrous sulfate 325 (65 FE) MG tablet Take 325 mg by mouth daily.   07/25/2019 at 1900  . fluticasone (FLOVENT HFA) 220 MCG/ACT inhaler Inhale 2 puffs into the lungs 2 (two) times daily. 1 Inhaler 12 07/25/2019 at Unknown time  . Multiple Vitamin (MULTIVITAMIN WITH MINERALS) TABS tablet Take 1 tablet by mouth daily.   07/25/2019 at 1900  . ondansetron  (ZOFRAN ODT) 4 MG disintegrating tablet Take 1 tablet (4 mg total) by mouth every 6 (six) hours as needed for nausea. 20 tablet 3 Unknown at PRN  . promethazine (PHENERGAN) 25 MG tablet Take 25 mg by mouth as needed for nausea or vomiting.   Unknown at PRN  . rivaroxaban (XARELTO) 20 MG TABS tablet Take 1 tablet (20 mg total) by mouth daily with supper. 30 tablet 3 07/25/2019 at 1900  . Spacer/Aero-Holding Chambers (AEROCHAMBER PLUS) inhaler Use as instructed 1 each 2   . vitamin C (ASCORBIC ACID) 500 MG tablet Take 500 mg by mouth daily.   07/25/2019 at 1900  . loratadine (CLARITIN) 10 MG tablet Take 1 tablet (10 mg total) by mouth daily. 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 Pt on rivaroxaban PTA. Surgery plan for 9/4. Dr Dahlia Byes has instructed me to turn off the heparin infusion  Heparin Course: 9/2 @ 2019:  HL = 0.68, aPTT = 67 9/3 @ 0517 HL = 0.32, therapeutic x 2 Now that aPTT and HL are both therapeutic, can use HL to guide dosing.   Goal of Therapy:  Heparin level  0.3-0.7 units/ml Monitor platelets by anticoagulation protocol: Yes   Plan:   Continue heparin infusion at 1350 units/hr  Stop heparin drip at 0000 07/28/19: I have spoken with the patient's nurse [facility] who will pass off this information to the night shift  Dallie Piles, PharmD 07/27/2019,11:38 AM

## 2019-07-28 ENCOUNTER — Inpatient Hospital Stay: Payer: 59 | Admitting: Anesthesiology

## 2019-07-28 ENCOUNTER — Inpatient Hospital Stay: Admission: RE | Admit: 2019-07-28 | Payer: 59 | Source: Home / Self Care | Admitting: Surgery

## 2019-07-28 ENCOUNTER — Encounter
Admission: EM | Disposition: A | Discharge status: Home / Self Care | Payer: Self-pay | Source: Home / Self Care | Attending: Internal Medicine

## 2019-07-28 ENCOUNTER — Encounter: Payer: Self-pay | Admitting: *Deleted

## 2019-07-28 DIAGNOSIS — K810 Acute cholecystitis: Secondary | ICD-10-CM

## 2019-07-28 HISTORY — PX: CHOLECYSTECTOMY: SHX55

## 2019-07-28 LAB — CBC
HCT: 34.2 % — ABNORMAL LOW (ref 36.0–46.0)
Hemoglobin: 10.2 g/dL — ABNORMAL LOW (ref 12.0–15.0)
MCH: 25.2 pg — ABNORMAL LOW (ref 26.0–34.0)
MCHC: 29.8 g/dL — ABNORMAL LOW (ref 30.0–36.0)
MCV: 84.7 fL (ref 80.0–100.0)
Platelets: 200 10*3/uL (ref 150–400)
RBC: 4.04 MIL/uL (ref 3.87–5.11)
RDW: 20.1 % — ABNORMAL HIGH (ref 11.5–15.5)
WBC: 4.4 10*3/uL (ref 4.0–10.5)
nRBC: 0 % (ref 0.0–0.2)

## 2019-07-28 LAB — COMPREHENSIVE METABOLIC PANEL
ALT: 33 U/L (ref 0–44)
AST: 22 U/L (ref 15–41)
Albumin: 3.2 g/dL — ABNORMAL LOW (ref 3.5–5.0)
Alkaline Phosphatase: 49 U/L (ref 38–126)
Anion gap: 7 (ref 5–15)
BUN: 7 mg/dL (ref 6–20)
CO2: 23 mmol/L (ref 22–32)
Calcium: 8.5 mg/dL — ABNORMAL LOW (ref 8.9–10.3)
Chloride: 112 mmol/L — ABNORMAL HIGH (ref 98–111)
Creatinine, Ser: 0.72 mg/dL (ref 0.44–1.00)
GFR calc Af Amer: >60 mL/min (ref >60)
GFR calc non Af Amer: >60 mL/min (ref >60)
Glucose, Bld: 85 mg/dL (ref 70–99)
Potassium: 3.8 mmol/L (ref 3.5–5.1)
Sodium: 142 mmol/L (ref 135–145)
Total Bilirubin: 0.5 mg/dL (ref 0.3–1.2)
Total Protein: 6.2 g/dL — ABNORMAL LOW (ref 6.5–8.1)

## 2019-07-28 LAB — HEMOGLOBIN A1C
Hgb A1c MFr Bld: 4.7 % — ABNORMAL LOW (ref 4.8–5.6)
Mean Plasma Glucose: 88.19 mg/dL

## 2019-07-28 LAB — HIV ANTIBODY (ROUTINE TESTING W REFLEX): HIV Screen 4th Generation wRfx: NONREACTIVE

## 2019-07-28 SURGERY — LAPAROSCOPIC CHOLECYSTECTOMY
Anesthesia: General | Site: Abdomen

## 2019-07-28 MED ORDER — ROCURONIUM BROMIDE 100 MG/10ML IV SOLN
INTRAVENOUS | Status: DC | PRN
  Administered 2019-07-28: 50 mg via INTRAVENOUS
  Administered 2019-07-28: 40 mg via INTRAVENOUS
  Administered 2019-07-28: 10 mg via INTRAVENOUS

## 2019-07-28 MED ORDER — SUGAMMADEX SODIUM 500 MG/5ML IV SOLN
INTRAVENOUS | Status: DC | PRN
  Administered 2019-07-28: 500 mg via INTRAVENOUS

## 2019-07-28 MED ORDER — SODIUM CHLORIDE FLUSH 0.9 % IV SOLN
INTRAVENOUS | Status: AC
  Administered 2019-07-28: 12:00:00
  Filled 2019-07-28: qty 20

## 2019-07-28 MED ORDER — SUCCINYLCHOLINE CHLORIDE 20 MG/ML IJ SOLN
INTRAMUSCULAR | Status: DC | PRN
  Administered 2019-07-28: 140 mg via INTRAVENOUS

## 2019-07-28 MED ORDER — PROPOFOL 10 MG/ML IV BOLUS
INTRAVENOUS | Status: DC | PRN
  Administered 2019-07-28: 150 mg via INTRAVENOUS

## 2019-07-28 MED ORDER — LACTATED RINGERS IV SOLN
INTRAVENOUS | Status: DC | PRN
  Administered 2019-07-28 (×2): via INTRAVENOUS

## 2019-07-28 MED ORDER — KETOROLAC TROMETHAMINE 30 MG/ML IJ SOLN
30.0000 mg | Freq: Four times a day (QID) | INTRAMUSCULAR | Status: DC
  Administered 2019-07-28 – 2019-07-29 (×4): 30 mg via INTRAVENOUS
  Filled 2019-07-28 (×5): qty 1

## 2019-07-28 MED ORDER — MIDAZOLAM HCL 2 MG/2ML IJ SOLN
INTRAMUSCULAR | Status: AC
  Filled 2019-07-28: qty 2

## 2019-07-28 MED ORDER — SCOPOLAMINE 1 MG/3DAYS TD PT72
MEDICATED_PATCH | TRANSDERMAL | Status: AC
  Administered 2019-07-28: 1.5 mg via TRANSDERMAL
  Filled 2019-07-28: qty 1

## 2019-07-28 MED ORDER — DEXAMETHASONE SODIUM PHOSPHATE 10 MG/ML IJ SOLN
INTRAMUSCULAR | Status: DC | PRN
  Administered 2019-07-28: 10 mg via INTRAVENOUS

## 2019-07-28 MED ORDER — FENTANYL CITRATE (PF) 100 MCG/2ML IJ SOLN
INTRAMUSCULAR | Status: AC
  Filled 2019-07-28: qty 2

## 2019-07-28 MED ORDER — PROMETHAZINE HCL 25 MG/ML IJ SOLN
6.2500 mg | INTRAMUSCULAR | Status: DC | PRN
  Administered 2019-07-28: 6.25 mg via INTRAVENOUS

## 2019-07-28 MED ORDER — RIVAROXABAN 20 MG PO TABS
20.0000 mg | ORAL_TABLET | Freq: Every day | ORAL | Status: DC
  Filled 2019-07-28: qty 1

## 2019-07-28 MED ORDER — MIDAZOLAM HCL 2 MG/2ML IJ SOLN
INTRAMUSCULAR | Status: DC | PRN
  Administered 2019-07-28: 2 mg via INTRAVENOUS

## 2019-07-28 MED ORDER — PROMETHAZINE HCL 25 MG/ML IJ SOLN
6.2500 mg | INTRAMUSCULAR | Status: AC | PRN
  Administered 2019-07-28 (×2): 6.25 mg via INTRAVENOUS

## 2019-07-28 MED ORDER — LIDOCAINE HCL (CARDIAC) PF 100 MG/5ML IV SOSY
PREFILLED_SYRINGE | INTRAVENOUS | Status: DC | PRN
  Administered 2019-07-28: 10 mg via INTRAVENOUS

## 2019-07-28 MED ORDER — SODIUM CHLORIDE FLUSH 0.9 % IV SOLN
INTRAVENOUS | Status: AC
  Administered 2019-07-28: 11:00:00
  Filled 2019-07-28: qty 10

## 2019-07-28 MED ORDER — PHENYLEPHRINE HCL (PRESSORS) 10 MG/ML IV SOLN
INTRAVENOUS | Status: DC | PRN
  Administered 2019-07-28: 100 ug via INTRAVENOUS

## 2019-07-28 MED ORDER — SODIUM CHLORIDE 0.9 % IV SOLN
INTRAVENOUS | Status: DC
  Administered 2019-07-28: 13:00:00 via INTRAVENOUS

## 2019-07-28 MED ORDER — BUPIVACAINE-EPINEPHRINE 0.25% -1:200000 IJ SOLN
INTRAMUSCULAR | Status: DC | PRN
  Administered 2019-07-28: 30 mL

## 2019-07-28 MED ORDER — SCOPOLAMINE 1 MG/3DAYS TD PT72
1.0000 | MEDICATED_PATCH | TRANSDERMAL | Status: DC
  Administered 2019-07-28: 12:00:00 1.5 mg via TRANSDERMAL
  Filled 2019-07-28: qty 1

## 2019-07-28 MED ORDER — CYANOCOBALAMIN 1000 MCG/ML IJ SOLN
1000.0000 ug | Freq: Once | INTRAMUSCULAR | Status: AC
  Administered 2019-07-28: 1000 ug via INTRAMUSCULAR
  Filled 2019-07-28 (×2): qty 1

## 2019-07-28 MED ORDER — FENTANYL CITRATE (PF) 100 MCG/2ML IJ SOLN
25.0000 ug | INTRAMUSCULAR | Status: AC | PRN
  Administered 2019-07-28 (×6): 25 ug via INTRAVENOUS

## 2019-07-28 MED ORDER — ACETAMINOPHEN 10 MG/ML IV SOLN
INTRAVENOUS | Status: DC | PRN
  Administered 2019-07-28: 1000 mg via INTRAVENOUS

## 2019-07-28 MED ORDER — FENTANYL CITRATE (PF) 250 MCG/5ML IJ SOLN
INTRAMUSCULAR | Status: AC
  Filled 2019-07-28: qty 5

## 2019-07-28 MED ORDER — SODIUM CHLORIDE FLUSH 0.9 % IV SOLN
INTRAVENOUS | Status: AC
  Administered 2019-07-28: 12:00:00
  Filled 2019-07-28: qty 10

## 2019-07-28 MED ORDER — FENTANYL CITRATE (PF) 100 MCG/2ML IJ SOLN
INTRAMUSCULAR | Status: AC
  Administered 2019-07-28: 25 ug via INTRAVENOUS
  Filled 2019-07-28: qty 2

## 2019-07-28 MED ORDER — ONDANSETRON HCL 4 MG/2ML IJ SOLN: 4.0000 mg | Freq: Once | INTRAMUSCULAR | Status: DC | PRN

## 2019-07-28 MED ORDER — PROMETHAZINE HCL 25 MG/ML IJ SOLN
INTRAMUSCULAR | Status: AC
  Administered 2019-07-28: 6.25 mg via INTRAVENOUS
  Filled 2019-07-28: qty 1

## 2019-07-28 MED ORDER — BUPIVACAINE-EPINEPHRINE (PF) 0.25% -1:200000 IJ SOLN
INTRAMUSCULAR | Status: AC
  Filled 2019-07-28: qty 30

## 2019-07-28 MED ORDER — HYDROMORPHONE HCL 1 MG/ML IJ SOLN
0.5000 mg | INTRAMUSCULAR | Status: DC | PRN
  Administered 2019-07-28: 0.5 mg via INTRAVENOUS
  Filled 2019-07-28: qty 0.5

## 2019-07-28 MED ORDER — KETOROLAC TROMETHAMINE 30 MG/ML IJ SOLN
INTRAMUSCULAR | Status: AC
  Administered 2019-07-28: 30 mg via INTRAVENOUS
  Filled 2019-07-28: qty 1

## 2019-07-28 MED ORDER — OXYCODONE-ACETAMINOPHEN 5-325 MG PO TABS
1.0000 | ORAL_TABLET | ORAL | Status: DC | PRN
  Administered 2019-07-28 – 2019-07-29 (×3): 2 via ORAL
  Filled 2019-07-28 (×3): qty 2

## 2019-07-28 SURGICAL SUPPLY — 41 items
APPLICATOR COTTON TIP 6 STRL (MISCELLANEOUS) ×1 IMPLANT
APPLICATOR COTTON TIP 6IN STRL (MISCELLANEOUS) ×2
APPLIER CLIP 5 13 M/L LIGAMAX5 (MISCELLANEOUS) ×2
CANISTER SUCT 1200ML W/VALVE (MISCELLANEOUS) ×2 IMPLANT
CHLORAPREP W/TINT 26 (MISCELLANEOUS) ×2 IMPLANT
CHOLANGIOGRAM CATH TAUT (CATHETERS) IMPLANT
CLIP APPLIE 5 13 M/L LIGAMAX5 (MISCELLANEOUS) ×1 IMPLANT
COVER WAND RF STERILE (DRAPES) ×2 IMPLANT
DECANTER SPIKE VIAL GLASS SM (MISCELLANEOUS) ×4 IMPLANT
DERMABOND ADVANCED (GAUZE/BANDAGES/DRESSINGS) ×1
DERMABOND ADVANCED .7 DNX12 (GAUZE/BANDAGES/DRESSINGS) ×1 IMPLANT
DRAPE C-ARM XRAY 36X54 (DRAPES) ×2 IMPLANT
ELECT CAUTERY BLADE 6.4 (BLADE) ×2 IMPLANT
ELECT REM PT RETURN 9FT ADLT (ELECTROSURGICAL) ×2
ELECTRODE REM PT RTRN 9FT ADLT (ELECTROSURGICAL) ×1 IMPLANT
GLOVE BIO SURGEON STRL SZ7 (GLOVE) ×2 IMPLANT
GOWN STRL REUS W/ TWL LRG LVL3 (GOWN DISPOSABLE) ×3 IMPLANT
GOWN STRL REUS W/TWL LRG LVL3 (GOWN DISPOSABLE) ×3
IRRIGATION STRYKERFLOW (MISCELLANEOUS) ×1 IMPLANT
IRRIGATOR STRYKERFLOW (MISCELLANEOUS) ×2
IV CATH ANGIO 12GX3 LT BLUE (NEEDLE) IMPLANT
IV NS 1000ML (IV SOLUTION) ×1
IV NS 1000ML BAXH (IV SOLUTION) ×1 IMPLANT
L-HOOK LAP DISP 36CM (ELECTROSURGICAL) ×2
LHOOK LAP DISP 36CM (ELECTROSURGICAL) ×1 IMPLANT
NEEDLE HYPO 22GX1.5 SAFETY (NEEDLE) ×2 IMPLANT
NS IRRIG 500ML POUR BTL (IV SOLUTION) ×2 IMPLANT
PACK LAP CHOLECYSTECTOMY (MISCELLANEOUS) ×2 IMPLANT
PENCIL ELECTRO HAND CTR (MISCELLANEOUS) ×2 IMPLANT
POUCH SPECIMEN RETRIEVAL 10MM (ENDOMECHANICALS) ×2 IMPLANT
SCISSORS METZENBAUM CVD 33 (INSTRUMENTS) ×2 IMPLANT
SET TUBE SMOKE EVAC HIGH FLOW (TUBING) ×2 IMPLANT
SLEEVE ENDOPATH XCEL 5M (ENDOMECHANICALS) ×5 IMPLANT
SPONGE LAP 18X18 RF (DISPOSABLE) ×2 IMPLANT
STOPCOCK 4 WAY LG BORE MALE ST (IV SETS) IMPLANT
SUT ETHIBOND 0 MO6 C/R (SUTURE) IMPLANT
SUT MNCRL AB 4-0 PS2 18 (SUTURE) ×2 IMPLANT
SUT VICRYL 0 AB UR-6 (SUTURE) ×5 IMPLANT
SYR 20ML LL LF (SYRINGE) ×2 IMPLANT
TROCAR XCEL BLUNT TIP 100MML (ENDOMECHANICALS) ×2 IMPLANT
TROCAR XCEL NON-BLD 5MMX100MML (ENDOMECHANICALS) ×2 IMPLANT

## 2019-07-28 NOTE — Progress Notes (Signed)
Pt c/o nausea. Pt had total of 8mg  Zofran in OR. Dr. Kayleen Memos notified. Acknowledged. Orders received. See MAR.

## 2019-07-28 NOTE — Progress Notes (Signed)
[facility] [place] NAME: Tara Shea    MR#:  [id number]  DATE OF BIRTH:  Dec 19, 1980  SUBJECTIVE:   Patient was seen after her cholecystectomy today.  She states she was very nauseous postop, but this has improved after receiving a couple of doses of IV Phenergan.  She endorses severe pain at her incision sites.  REVIEW OF SYSTEMS:  Review of Systems  Constitutional: Negative for chills and fever.  HENT: Negative for congestion and sore throat.   Eyes: Negative for blurred vision and double vision.  Respiratory: Negative for cough and shortness of breath.   Cardiovascular: Negative for chest pain and palpitations.  Gastrointestinal: Positive for nausea. Negative for abdominal pain and vomiting.  Genitourinary: Negative for dysuria and urgency.  Musculoskeletal: Negative for back pain and neck pain.  Neurological: Negative for dizziness and headaches.  Psychiatric/Behavioral: Negative for depression. The patient is not nervous/anxious.     DRUG ALLERGIES:  No Known Allergies VITALS:  Blood pressure (!) 93/51, pulse 63, temperature 97.6 F (36.4 C), temperature source Oral, resp. rate 18, height 5\' 8"  (1.727 m), weight (!) 143.9 kg, last menstrual period 07/10/2019, SpO2 98 %. PHYSICAL EXAMINATION:  Physical Exam  GENERAL:   Sitting up in the bed with no acute distress.  HEENT: Head atraumatic, normocephalic. Pupils equal, round, reactive to light and accommodation. No scleral icterus. Extraocular muscles intact. Oropharynx and nasopharynx clear.  NECK:  Supple, no jugular venous distention. No thyroid enlargement. LUNGS: Lungs are clear to auscultation bilaterally. No wheezes, crackles, rhonchi. No use of accessory muscles of respiration.  CARDIOVASCULAR: RRR, S1, S2 normal. No murmurs, rubs, or gallops.  ABDOMEN: Soft, nondistended. Bowel sounds present. + Right upper quadrant pain present. + Laparoscopic incision sites  present.  No rebound or guarding. EXTREMITIES: No pedal edema, cyanosis, or clubbing.  NEUROLOGIC: CN 2-12 intact, no focal deficits. 5/5 muscle strength throughout all extremities. Sensation intact throughout. Gait not checked.  PSYCHIATRIC: The patient is alert and oriented x 3.  SKIN: No obvious rash, lesion, or ulcer.  LABORATORY PANEL:  Female CBC Recent Labs  Lab 07/28/19 0747  WBC 4.4  HGB 10.2*  HCT 34.2*  PLT 200   ------------------------------------------------------------------------------------------------------------------ Chemistries  Recent Labs  Lab 07/28/19 0747  NA 142  K 3.8  CL 112*  CO2 23  GLUCOSE 85  BUN 7  CREATININE 0.72  CALCIUM 8.5*  AST 22  ALT 33  ALKPHOS 49  BILITOT 0.5   RADIOLOGY:  No results found. ASSESSMENT AND PLAN:   Acute cholecystitis- s/p laparoscopic cholecystectomy this morning. -Surgery following -Continue Zosyn -Pain control -IV fluids -IV antiemetics -Start full liquid diet post-op, and will advance as tolerated  Uterine mass- transvaginal US with mass within the endometrium.  Differentials include fibroid, polyp, or carcinoma. 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 Management plans discussed with the patient, family and they are in agreement.  CODE STATUS: Full Code  TOTAL TIME TAKING CARE  OF THIS PATIENT: 40 minutes.   More than 50% of the time was spent in counseling/coordination of  care: YES  POSSIBLE D/C IN 2-3 DAYS, DEPENDING ON CLINICAL CONDITION.   Berna Spare Tres Grzywacz M.D on 07/28/2019 at 2:23 PM  Between 7am to 6pm - Pager [phone]  After 6pm go to [email or web address] - Proofreader  [facility]  Office  [phone]  CC: Primary care physician; Hubbard Hartshorn, FNP  Note: This dictation was prepared with Dragon dictation along with smaller phrase technology. Any transcriptional errors that result from this process are unintentional.

## 2019-07-28 NOTE — Progress Notes (Signed)
Pt still c/o nausea. Dr. Kayleen Memos. Notified. Acknowledged. Orders received. See [facility]

## 2019-07-28 NOTE — Op Note (Signed)
Laparoscopic Cholecystectomy  Pre-operative Diagnosis: Acute cholecystitis, super morbid obesity  Post-operative Diagnosis: Same  Procedure: Laparoscopic cholecystectomy  Surgeon: Caroleen Hamman, MD FACS  Anesthesia: Gen. with endotracheal tube  Findings: Acute Cholecystitis  Raw liver surface due to inflammatory response Large Fatty liver  Estimated Blood Loss: 20cc               Specimens: Gallbladder           Complications: none   Procedure Details  The patient was seen again in the Holding Room. The benefits, complications, treatment options, and expected outcomes were discussed with the patient. The risks of bleeding, infection, recurrence of symptoms, failure to resolve symptoms, bile duct damage, bile duct leak, retained common bile duct stone, bowel injury, any of which could require further surgery and/or ERCP, stent, or papillotomy were reviewed with the patient. The likelihood of improving the patient's symptoms with return to their baseline status is good.  The patient and/or family concurred with the proposed plan, giving informed consent.  The patient was taken to Operating Room, identified as Tara Shea and the procedure verified as Laparoscopic Cholecystectomy.  A Time Out was held and the above information confirmed.  Prior to the induction of general anesthesia, antibiotic prophylaxis was administered. VTE prophylaxis was in place. General endotracheal anesthesia was then administered and tolerated well. After the induction, the abdomen was prepped with Chloraprep and draped in the sterile fashion. The patient was positioned in the supine position.  Cut down technique was used to enter the abdominal cavity and a Hasson trochar was placed after two vicryl stitches were anchored to the fascia. Pneumoperitoneum was then created with CO2 and tolerated well without any adverse changes in the patient's vital signs.  Four 5-mm ports were placed  all under direct  vision. All skin incisions  were infiltrated with a local anesthetic agent before making the incision and placing the trocars.   The patient was positioned  in reverse Trendelenburg, tilted slightly to the patient's left.  The gallbladder was identified, the fundus grasped and retracted cephalad. Adhesions were lysed bluntly. The infundibulum was grasped and retracted laterally, exposing the peritoneum overlying the triangle of Calot. This was then divided and exposed in a blunt fashion. An extended critical view of the cystic duct and cystic artery was obtained.  The cystic duct was clearly identified and bluntly dissected.   Artery and duct were double clipped and divided.  The gallbladder was taken from the gallbladder fossa in a retrograde fashion with the electrocautery. The gallbladder was removed and placed in an Endocatch bag. The liver bed was irrigated and inspected. Hemostasis was achieved with the electrocautery. Copious irrigation was utilized and was repeatedly aspirated until clear.  The gallbladder and Endocatch sac were then removed through a port site.    Inspection of the right upper quadrant was performed. No bleeding, bile duct injury or leak, or bowel injury was noted. Pneumoperitoneum was released.  The periumbilical port site was closed with interrumpted 0 Vicryl sutures. 4-0 subcuticular Monocryl was used to close the skin. Dermabond was  applied.  The patient was then extubated and brought to the recovery room in stable condition. Sponge, lap, and needle counts were correct at closure and at the conclusion of the case.               Caroleen Hamman, MD, FACS

## 2019-07-28 NOTE — Transfer of Care (Signed)
Immediate Anesthesia Transfer of Care Note  Patient: Tara Shea  Procedure(s) Performed: LAPAROSCOPIC CHOLECYSTECTOMY (N/A Abdomen)  Patient Location: PACU  Anesthesia Type:General  Level of Consciousness: awake and sedated  Airway & Oxygen Therapy: Patient Spontanous Breathing and Patient connected to face mask oxygen  Post-op Assessment: Report given to RN and Post -op Vital signs reviewed and stable  Post vital signs: Reviewed and stable  Last Vitals:  Vitals Value Taken Time  BP    Temp    Pulse    Resp    SpO2      Last Pain:  Vitals:   07/28/19 0814  TempSrc: Temporal  PainSc: 0-No pain         Complications: No apparent anesthesia complications

## 2019-07-28 NOTE — Anesthesia Post-op Follow-up Note (Signed)
Anesthesia QCDR form completed.        

## 2019-07-28 NOTE — Anesthesia Procedure Notes (Signed)
Procedure Name: Intubation Date/Time: 07/28/2019 9:14 AM Performed by: Nelda Marseille, CRNA Pre-anesthesia Checklist: Patient identified, Patient being monitored, Timeout performed, Emergency Drugs available and Suction available Patient Re-evaluated:Patient Re-evaluated prior to induction Oxygen Delivery Method: Circle system utilized Preoxygenation: Pre-oxygenation with 100% oxygen Induction Type: IV induction Ventilation: Mask ventilation without difficulty Laryngoscope Size: Mac and 3 Grade View: Grade I Tube type: Oral Tube size: 7.0 mm Number of attempts: 1 Airway Equipment and Method: Stylet and Video-laryngoscopy Placement Confirmation: ETT inserted through vocal cords under direct vision,  positive ETCO2 and breath sounds checked- equal and bilateral Secured at: 21 cm Tube secured with: Tape Dental Injury: Teeth and Oropharynx as per pre-operative assessment

## 2019-07-28 NOTE — Progress Notes (Signed)
Preoperative Review   Patient is met in the preoperative holding area. The history is reviewed in the chart and with the patient. I personally reviewed the options and rationale as well as the risks of this procedure that have been previously discussed with the patient. All questions asked by the patient and/or family were answered to their satisfaction. The risks, benefits, complications, treatment options, and expected outcomes were discussed with the patient. The possibilities of bleeding, recurrent infection, finding a normal gallbladder, perforation of viscus organs, damage to surrounding structures, bile leak, abscess formation, needing a drain placed, the need for additional procedures, reaction to medication, pulmonary aspiration,  failure to diagnose a condition, the possible need to convert to an open procedure, and creating a complication requiring transfusion or operation were discussed with the patient. The patient and/or family concurred with the proposed plan, giving informed consent.  Patient agrees to proceed with this procedure at this time.  Tara Shea M.D. FACS   

## 2019-07-29 LAB — CBC
HCT: 31.1 % — ABNORMAL LOW (ref 36.0–46.0)
Hemoglobin: 9.2 g/dL — ABNORMAL LOW (ref 12.0–15.0)
MCH: 25.3 pg — ABNORMAL LOW (ref 26.0–34.0)
MCHC: 29.6 g/dL — ABNORMAL LOW (ref 30.0–36.0)
MCV: 85.4 fL (ref 80.0–100.0)
Platelets: 174 10*3/uL (ref 150–400)
RBC: 3.64 MIL/uL — ABNORMAL LOW (ref 3.87–5.11)
RDW: 20.3 % — ABNORMAL HIGH (ref 11.5–15.5)
WBC: 5.6 10*3/uL (ref 4.0–10.5)
nRBC: 0 % (ref 0.0–0.2)

## 2019-07-29 LAB — URINALYSIS, COMPLETE (UACMP) WITH MICROSCOPIC
Bacteria, UA: NONE SEEN
Bilirubin Urine: NEGATIVE
Glucose, UA: NEGATIVE mg/dL
Ketones, ur: 5 mg/dL — AB
Leukocytes,Ua: NEGATIVE
Nitrite: NEGATIVE
Protein, ur: 30 mg/dL — AB
Specific Gravity, Urine: 1.041 — ABNORMAL HIGH (ref 1.005–1.030)
pH: 5 (ref 5.0–8.0)

## 2019-07-29 LAB — BASIC METABOLIC PANEL
Anion gap: 7 (ref 5–15)
BUN: 8 mg/dL (ref 6–20)
CO2: 23 mmol/L (ref 22–32)
Calcium: 8.6 mg/dL — ABNORMAL LOW (ref 8.9–10.3)
Chloride: 112 mmol/L — ABNORMAL HIGH (ref 98–111)
Creatinine, Ser: 0.74 mg/dL (ref 0.44–1.00)
GFR calc Af Amer: >60 mL/min (ref >60)
GFR calc non Af Amer: >60 mL/min (ref >60)
Glucose, Bld: 114 mg/dL — ABNORMAL HIGH (ref 70–99)
Potassium: 3.6 mmol/L (ref 3.5–5.1)
Sodium: 142 mmol/L (ref 135–145)

## 2019-07-29 MED ORDER — VITAMIN B-12 1000 MCG PO TABS: 1000.0000 ug | ORAL_TABLET | Freq: Every day | ORAL | 0 refills | Status: DC

## 2019-07-29 MED ORDER — OXYCODONE-ACETAMINOPHEN 5-325 MG PO TABS: 1.0000 | ORAL_TABLET | ORAL | 0 refills | Status: DC | PRN

## 2019-07-29 MED ORDER — ONDANSETRON 4 MG PO TBDP: 4.0000 mg | ORAL_TABLET | Freq: Four times a day (QID) | ORAL | 0 refills | Status: DC | PRN

## 2019-07-29 NOTE — Progress Notes (Signed)
Discharge instructions were reviewed with patient and husband including new medication and follow-up visits. Understanding of information was verbalized by patient. Iv was removed and patient tolerated it well. Patient was escorted outside to her vehicle by wheelchair. She was in a stable condition.

## 2019-07-29 NOTE — Progress Notes (Signed)
Subjective:  CC: Tara Shea is a 38 y.o. female  Hospital stay day 3, 1 Day Post-Op lap chole  HPI: No acute issues overnight.  Tolerated clears  ROS:  General: Denies weight loss, weight gain, fatigue, fevers, chills, and night sweats. Heart: Denies chest pain, palpitations, racing heart, irregular heartbeat, leg pain or swelling, and decreased activity tolerance. Respiratory: Denies breathing difficulty, shortness of breath, wheezing, cough, and sputum. GI: Denies change in appetite, heartburn, nausea, vomiting, constipation, diarrhea, and blood in stool. GU: Denies difficulty urinating, pain with urinating, urgency, frequency, blood in urine.   Objective:   Temp:  [97 F (36.1 C)-98.5 F (36.9 C)] 97.9 F (36.6 C) (09/05 0434) Pulse Rate:  [50-76] 50 (09/05 0434) Resp:  [0-31] 18 (09/05 0434) BP: (93-115)/(47-76) 96/65 (09/05 0434) SpO2:  [93 %-100 %] 98 % (09/05 0434) Weight:  [143.9 kg] 143.9 kg (09/04 0814)     Height: 5\' 8"  (172.7 cm) Weight: (!) 143.9 kg BMI (Calculated): 48.26   Intake/Output this shift:   Intake/Output Summary (Last 24 hours) at 07/29/2019 0720 Last data filed at 07/29/2019 0200 Gross per 24 hour  Intake 1941.88 ml  Output 1610 ml  Net 331.88 ml    Constitutional :  alert, cooperative, appears stated age and no distress  Respiratory:  clear to auscultation bilaterally  Cardiovascular:  regular rate and rhythm  Gastrointestinal: soft, no guarding, TTP in RUQ, as expected.  incisions c/d/i.   Skin: Cool and moist.   Psychiatric: Normal affect, non-agitated, not confused       LABS:  CMP Latest Ref Rng & Units 07/28/2019 07/27/2019 07/26/2019  Glucose 70 - 99 mg/dL 85 90 142(H)  BUN 6 - 20 mg/dL 7 12 15   Creatinine 0.44 - 1.00 mg/dL 0.72 0.73 0.79  Sodium 135 - 145 mmol/L 142 140 139  Potassium 3.5 - 5.1 mmol/L 3.8 3.7 3.9  Chloride 98 - 111 mmol/L 112(H) 108 106  CO2 22 - 32 mmol/L 23 25 22   Calcium 8.9 - 10.3 mg/dL 8.5(L) 8.4(L) 9.1   Total Protein 6.5 - 8.1 g/dL 6.2(L) 6.1(L) 7.3  Total Bilirubin 0.3 - 1.2 mg/dL 0.5 0.4 0.7  Alkaline Phos 38 - 126 U/L 49 58 77  AST 15 - 41 U/L 22 38 70(H)  ALT 0 - 44 U/L 33 47(H) 36   CBC Latest Ref Rng & Units 07/28/2019 07/27/2019 07/26/2019  WBC 4.0 - 10.5 K/uL 4.4 4.5 8.6  Hemoglobin 12.0 - 15.0 g/dL 10.2(L) 10.2(L) 11.8(L)  Hematocrit 36.0 - 46.0 % 34.2(L) 33.8(L) 40.4  Platelets 150 - 400 K/uL 200 225 270    RADS: n/a Assessment:   S/p lap chole.  Doing as expected. Advance diet, ok to d/c when tolerating and pain controlled with oral meds, no need for abx.  F/u dr. Dahlia Byes one week

## 2019-07-29 NOTE — Discharge Instructions (Signed)
It was a pleasure taking care of you during this hospitalization!  You came into the hospital with right upper quadrant pain, which was coming from your gallbladder. You had gallstones and some early cholecystitis.  We also found that your vitamin B12 level was low, so we gave you an injection and sent you home with some vitamin B12 tablets.  Please make sure you follow-up with the surgeon, gynecologist, and hematologist when you leave the hospital.  Take care, Dr. Brett Albino

## 2019-07-29 NOTE — Discharge Summary (Signed)
[facility] [place] NAME: Tara Shea    MR#:  [id number]  DATE OF BIRTH:  1981/09/25  DATE OF ADMISSION:  07/26/2019   ADMITTING PHYSICIAN: Sela Hua, MD  DATE OF DISCHARGE: 07/29/19  PRIMARY CARE PHYSICIAN: Hubbard Hartshorn, FNP   ADMISSION DIAGNOSIS:  Vomiting [R11.10] Right upper quadrant abdominal pain [R10.11] DISCHARGE DIAGNOSIS:  Active Problems:   Cholelithiasis  SECONDARY DIAGNOSIS:   Past Medical History:  Diagnosis Date  . Asthma   . Bone spur   . DVT (deep venous thrombosis) ([place])   . Factor 5 Leiden mutation, heterozygous ([place])   . Fibroid   . IDA (iron deficiency anemia)   . Migraines   . PCOS (polycystic ovarian syndrome)    HOSPITAL COURSE:   Tara Shea is a 38 year old female who presented to the ED with nausea, vomiting, and right upper quadrant abdominal pain.  In the ED, right upper quadrant ultrasound showed cholelithiasis with a 2.7 cm stone in the gallbladder neck, without evidence of acute cholecystitis.  CT abdomen/pelvis showed an enlarged appearance of the uterus with a lobular heterogeneity at the uterine fundus.  She was admitted for further management.  Acute cholecystitis-s/p laparoscopic cholecystectomy 9/4 -Gallbladder findings during surgery were consistent with cholecystitis -Treated with Zosyn, but antibiotics were stopped once gallbladder was removed -Patient will follow-up with general surgery in 1 week  Uterinemass- seen on CT abdomen/pelvis -Urine pregnancy test was negative, beta-hCG negative. -Transvaginal US heterogeneous, predominantly solid mass within the endometrium, measuring 5.3 cm in size -Will need to follow-up with gynecology as an outpatient for sonohysterogram  Vitamin B12 deficiency- B12 found to be low this admission -Given a dose of vitamin B12 IM and prescribed vitamin B12 tablets on discharge -Remainder of anemia panel was unremarkable -Needs to  follow-up with hematology as an outpatient  Factor V Leiden deficiency with history of DVT- on xarelto at home -Patient was on heparin drip prior to surgery, but Xarelto was restarted 24 hours after surgery   DISCHARGE CONDITIONS:  Acute cholecystitis s/p laparoscopic cholecystectomy Uterine mass Vitamin B12 deficiency Factor V Leyden deficiency History of DVT on Xarelto CONSULTS OBTAINED:  Treatment Team:  Jules Husbands, MD DRUG ALLERGIES:  No Known Allergies DISCHARGE MEDICATIONS:   Allergies as of 07/29/2019   No Known Allergies     Medication List    STOP taking these medications   loratadine 10 MG tablet Commonly known as: CLARITIN     TAKE these medications   AeroChamber Plus inhaler Use as instructed   albuterol (2.5 MG/3ML) 0.083% nebulizer solution Commonly known as: PROVENTIL Take 3 mLs (2.5 mg total) by nebulization every 6 (six) hours as needed for wheezing or shortness of breath.   albuterol 108 (90 Base) MCG/ACT inhaler Commonly known as: VENTOLIN HFA Inhale 2 puffs into the lungs every 4 (four) hours as needed for wheezing or shortness of breath.   ferrous sulfate 325 (65 FE) MG tablet Take 325 mg by mouth daily.   fluticasone 220 MCG/ACT inhaler Commonly known as: Flovent HFA Inhale 2 puffs into the lungs 2 (two) times daily.   multivitamin with minerals Tabs tablet Take 1 tablet by mouth daily.   ondansetron 4 MG disintegrating tablet Commonly known as: Zofran ODT Take 1 tablet (4 mg total) by mouth every 6 (six) hours as needed for nausea.   oxyCODONE-acetaminophen 5-325 MG tablet Commonly known as: PERCOCET/ROXICET Take 1-2 tablets by mouth every 4 (four) hours as  needed for moderate pain.   promethazine 25 MG tablet Commonly known as: PHENERGAN Take 25 mg by mouth as needed for nausea or vomiting.   rivaroxaban 20 MG Tabs tablet Commonly known as: Xarelto Take 1 tablet (20 mg total) by mouth daily with supper.   vitamin B-12 1000  MCG tablet Commonly known as: CYANOCOBALAMIN Take 1 tablet (1,000 mcg total) by mouth daily.   vitamin C 500 MG tablet Commonly known as: ASCORBIC ACID Take 500 mg by mouth daily.      DISCHARGE INSTRUCTIONS:  1.  Follow-up with PCP in 5 days 2.  Follow-up with general surgery in 1 week 3.  Follow-up with gynecology in 1-2 weeks 4.  Follow-up with hematology in 2-3 weeks 5.  Take vitamin B12 as prescribed DIET:  Cardiac diet DISCHARGE CONDITION:  Stable ACTIVITY:  Activity as tolerated OXYGEN:  Home Oxygen: No.  Oxygen Delivery: room air DISCHARGE LOCATION:  home   If you experience worsening of your admission symptoms, develop shortness of breath, life threatening emergency, suicidal or homicidal thoughts you must seek medical attention immediately by calling 911 or calling your MD immediately  if symptoms less severe.  You Must read complete instructions/literature along with all the possible adverse reactions/side effects for all the Medicines you take and that have been prescribed to you. Take any new Medicines after you have completely understood and accpet all the possible adverse reactions/side effects.   Please note  You were cared for by a hospitalist during your hospital stay. If you have any questions about your discharge medications or the care you received while you were in the hospital after you are discharged, you can call the unit and asked to speak with the hospitalist on call if the hospitalist that took care of you is not available. Once you are discharged, your primary care physician will handle any further medical issues. Please note that NO REFILLS for any discharge medications will be authorized once you are discharged, as it is imperative that you return to your primary care physician (or establish a relationship with a primary care physician if you do not have one) for your aftercare needs so that they can reassess your need for medications and monitor your  lab values.    On the day of Discharge:  VITAL SIGNS:  Blood pressure 96/65, pulse (!) 50, temperature 97.9 F (36.6 C), temperature source Oral, resp. rate 18, height 5\' 8"  (1.727 m), weight (!) 143.9 kg, last menstrual period 07/10/2019, SpO2 98 %. PHYSICAL EXAMINATION:  GENERAL: Sitting up in the bed with no acute distress.  HEENT: Head atraumatic, normocephalic. Pupils equal, round, reactive to light and accommodation. No scleral icterus. Extraocular muscles intact. Oropharynx and nasopharynx clear.  NECK: Supple, no jugular venous distention. No thyroid enlargement. LUNGS:Lungs are clear to auscultation bilaterally. No wheezes, crackles, rhonchi.No use of accessory muscles of respiration.  CARDIOVASCULAR: RRR, S1, S2 normal. No murmurs, rubs, or gallops.  ABDOMEN: Soft, nondistended. Bowel sounds present. + Right upper quadrant pain present. + Laparoscopic incision sites present without erythema or drainage.  No rebound or guarding. EXTREMITIES: No pedal edema, cyanosis, or clubbing.  NEUROLOGIC:CN 2-12 intact, no focal deficits. 5/5 muscle strength throughout all extremities. Sensation intact throughout. Gait not checked.  PSYCHIATRIC: The patient is alert and oriented x 3.  SKIN: No obvious rash, lesion, or ulcer.  DATA REVIEW:   CBC Recent Labs  Lab 07/29/19 0618  WBC 5.6  HGB 9.2*  HCT 31.1*  PLT 174  Chemistries  Recent Labs  Lab 07/28/19 0747 07/29/19 0618  NA 142 142  K 3.8 3.6  CL 112* 112*  CO2 23 23  GLUCOSE 85 114*  BUN 7 8  CREATININE 0.72 0.74  CALCIUM 8.5* 8.6*  AST 22  --   ALT 33  --   ALKPHOS 49  --   BILITOT 0.5  --      Microbiology Results  Results for orders placed or performed during the hospital encounter of 07/26/19  SARS Coronavirus 2 [facility] order, Performed in [facility] hospital lab) Nasopharyngeal Nasopharyngeal Swab     Status: None   Collection Time: 07/26/19  3:08 AM   Specimen: Nasopharyngeal Swab  Result Value  Ref Range Status   SARS Coronavirus 2 NEGATIVE NEGATIVE Final    Comment: (NOTE) If result is NEGATIVE SARS-CoV-2 target nucleic acids are NOT DETECTED. The SARS-CoV-2 RNA is generally detectable in upper and lower  respiratory specimens during the acute phase of infection. The lowest  concentration of SARS-CoV-2 viral copies this assay can detect is 250  copies / mL. A negative result does not preclude SARS-CoV-2 infection  and should not be used as the sole basis for treatment or other  patient management decisions.  A negative result may occur with  improper specimen collection / handling, submission of specimen other  than nasopharyngeal swab, presence of viral mutation(s) within the  areas targeted by this assay, and inadequate number of viral copies  (<250 copies / mL). A negative result must be combined with clinical  observations, patient history, and epidemiological information. If result is POSITIVE SARS-CoV-2 target nucleic acids are DETECTED. The SARS-CoV-2 RNA is generally detectable in upper and lower  respiratory specimens dur ing the acute phase of infection.  Positive  results are indicative of active infection with SARS-CoV-2.  Clinical  correlation with patient history and other diagnostic information is  necessary to determine patient infection status.  Positive results do  not rule out bacterial infection or co-infection with other viruses. If result is PRESUMPTIVE POSTIVE SARS-CoV-2 nucleic acids MAY BE PRESENT.   A presumptive positive result was obtained on the submitted specimen  and confirmed on repeat testing.  While 2019 novel coronavirus  (SARS-CoV-2) nucleic acids may be present in the submitted sample  additional confirmatory testing may be necessary for epidemiological  and / or clinical management purposes  to differentiate between  SARS-CoV-2 and other Sarbecovirus currently known to infect humans.  If clinically indicated additional testing with an  alternate test  methodology [phone]) is advised. The SARS-CoV-2 RNA is generally  detectable in upper and lower respiratory sp ecimens during the acute  phase of infection. The expected result is Negative. Fact Sheet for Patients:  StrictlyIdeas.no Fact Sheet for Healthcare Providers: BankingDealers.co.za This test is not yet approved or cleared by the [place] FDA and has been authorized for detection and/or diagnosis of SARS-CoV-2 by FDA under an Emergency Use Authorization (EUA).  This EUA will remain in effect (meaning this test can be used) for the duration of the COVID-19 declaration under Section 564(b)(1) of the Act, 21 U.S.C. section 360bbb-3(b)(1), unless the authorization is terminated or revoked sooner. Performed at [facility], [address], [place], [place] [address]   Surgical PCR screen     Status: Abnormal   Collection Time: 07/26/19  8:50 PM   Specimen: Nasal Mucosa; Nasal Swab  Result Value Ref Range Status   MRSA, PCR NEGATIVE NEGATIVE Final   Staphylococcus aureus  POSITIVE (A) NEGATIVE Final    Comment: (NOTE) The Xpert SA Assay (FDA approved for NASAL specimens in patients 48 years of age and older), is one component of a comprehensive surveillance program. It is not intended to diagnose infection nor to guide or monitor treatment. Performed at [facility], [address] [place], [place] [address]     RADIOLOGY:  No results found.   Management plans discussed with the patient, family and they are in agreement.  CODE STATUS: Full Code   TOTAL TIME TAKING CARE OF THIS PATIENT: 45 minutes.    Berna Spare Sharilynn Cassity M.D on 07/29/2019 at 10:48 AM  Between 7am to 6pm - Pager - [phone]  After 6pm go to [email or web address] - Proofreader  [facility]  Office  [phone]  CC: Primary care physician; Hubbard Hartshorn, FNP   Note: This dictation was  prepared with Dragon dictation along with smaller phrase technology. Any transcriptional errors that result from this process are unintentional.

## 2019-07-30 ENCOUNTER — Encounter: Payer: Self-pay | Admitting: Surgery

## 2019-08-01 ENCOUNTER — Telehealth: Payer: Self-pay | Admitting: Surgery

## 2019-08-01 ENCOUNTER — Other Ambulatory Visit: Payer: Self-pay | Admitting: *Deleted

## 2019-08-01 ENCOUNTER — Encounter: Payer: Self-pay | Admitting: *Deleted

## 2019-08-01 LAB — SURGICAL PATHOLOGY

## 2019-08-01 MED ORDER — NYSTATIN 100000 UNIT/ML MT SUSP: 5.0000 mL | Freq: Four times a day (QID) | OROMUCOSAL | 0 refills | Status: DC

## 2019-08-01 NOTE — Anesthesia Postprocedure Evaluation (Signed)
Anesthesia Post Note  Patient: Tara Shea  Procedure(s) Performed: LAPAROSCOPIC CHOLECYSTECTOMY (N/A Abdomen)  Patient location during evaluation: PACU Anesthesia Type: General Level of consciousness: awake and alert and oriented Pain management: pain level controlled Vital Signs Assessment: post-procedure vital signs reviewed and stable Respiratory status: spontaneous breathing Cardiovascular status: blood pressure returned to baseline Anesthetic complications: no     Last Vitals:  Vitals:   07/28/19 1956 07/29/19 0434  BP: (!) 110/55 96/65  Pulse: (!) 59 (!) 50  Resp: 20 18  Temp: 36.9 C 36.6 C  SpO2: 96% 98%    Last Pain:  Vitals:   07/29/19 1315  TempSrc:   PainSc: 3                  Hallel Denherder

## 2019-08-01 NOTE — Patient Outreach (Addendum)
[facility]) Care Management  08/01/2019  Tara Shea 1981/07/24 [id number]   Subjective: Telephone call to patient's home / mobile number, spoke with patient, and HIPAA verified.  Discussed [facility] Care Management UMR Transition of care follow up, patient voiced understanding, and is in agreement to follow up.   Patient states she is doing okay, thinks she has a yeast infection, MD aware, has a follow up appointment with gynecologist on 08/02/2019, and with surgeon on 08/02/2019.   States she have an appointment with hematologist / oncologist in the near future (per chart review, 09/20/2019) and will call primary  MD to schedule follow up as soon as possible.  Discussed importance of hospital follow up with primary MD, patient voices understanding, and states she will follow up as appropriate. Patient states she is able to manage self care and has assistance as needed. Patient voices understanding of medical diagnosis, surgery, and treatment plan.  Patient states she will be having a procedure in the near  future to evaluate the abnormal abdominal growth found during recent surgery.  States she is accessing the following [facility] benefits: outpatient pharmacy, hospital indemnity (verbally given contact number for Teena Dunk [phone], she will call to verify benefits, will file claim if appropriate), nutritional counseling benefit through [place] and Diabetes, verbally given contact number [phone]), she will call Benefits Specialist at [facility] (verbally given contact number, [phone]), [place] ( verbally given contact number [phone]) regarding her benefit questions.  Patient also has questions regarding short term disability and is planning to follow up with [place] (verbally given contact number, [phone]).  Discussed Advanced Directives, advised of [facility] Employee Spiritual Care Advanced Directives document completion benefit,  patient voices understanding, declined to access benefit at this time, and is aware of how to access benefit if needed. Patient states she does not have any education material, transition of care, care coordination, disease management, disease monitoring, transportation, community resource, or pharmacy needs at this time.  States she is very appreciative of the follow up and is in agreement to receive [place] Management information.     Objective: Per KPN (Knowledge Performance Now, point of care tool) and chart review, patient hospitalized 07/26/2019 -07/29/2019 for Cholelithiasis, status post Laparoscopic cholecystectomy on 07/28/2019.   Patient also has a history of PCO (polycystic ovarian syndrome)S , fibroids, DVT, asthma, Bone spur, Factor 5 Leiden mutation, heterozygous, IDA (iron deficiency anemia), and Migraines.       Assessment: Assigned RNCM received UMR Transition of care referral on 07/27/2019.   Transition of care follow up completed, no care management needs, and will proceed with case closure.   Outpatient Encounter Medications as of 08/01/2019  Medication Sig Note  . albuterol (PROVENTIL HFA;VENTOLIN HFA) 108 (90 Base) MCG/ACT inhaler Inhale 2 puffs into the lungs every 4 (four) hours as needed for wheezing or shortness of breath.   Marland Kitchen albuterol (PROVENTIL) (2.5 MG/3ML) 0.083% nebulizer solution Take 3 mLs (2.5 mg total) by nebulization every 6 (six) hours as needed for wheezing or shortness of breath.   . ferrous sulfate 325 (65 FE) MG tablet Take 325 mg by mouth daily.   . fluticasone (FLOVENT HFA) 220 MCG/ACT inhaler Inhale 2 puffs into the lungs 2 (two) times daily.   . Multiple Vitamin (MULTIVITAMIN WITH MINERALS) TABS tablet Take 1 tablet by mouth daily.   Marland Kitchen nystatin (MYCOSTATIN) 100000 UNIT/ML suspension Take 5 mLs (500,000 Units total) by mouth 4 (four) times daily.   Marland Kitchen  ondansetron (ZOFRAN ODT) 4 MG disintegrating tablet Take 1 tablet (4 mg total) by mouth every 6 (six) hours as  needed for nausea.   Marland Kitchen oxyCODONE-acetaminophen (PERCOCET/ROXICET) 5-325 MG tablet Take 1-2 tablets by mouth every 4 (four) hours as needed for moderate pain.   . promethazine (PHENERGAN) 25 MG tablet Take 25 mg by mouth as needed for nausea or vomiting.   . rivaroxaban (XARELTO) 20 MG TABS tablet Take 1 tablet (20 mg total) by mouth daily with supper.   Marland Kitchen Spacer/Aero-Holding Chambers (AEROCHAMBER PLUS) inhaler Use as instructed   . vitamin B-12 (CYANOCOBALAMIN) 1000 MCG tablet Take 1 tablet (1,000 mcg total) by mouth daily.   . vitamin C (ASCORBIC ACID) 500 MG tablet Take 500 mg by mouth daily.   . vitamin B-12 (CYANOCOBALAMIN) 1000 MCG tablet Take 1 tablet (1,000 mcg total) by mouth daily. (Patient not taking: Reported on [id number]) [id number]: Duplicate.    No facility-administered encounter medications on file as of 08/01/2019.    Fall Risk  08/01/2019 04/07/2019 03/22/2019 03/01/2019 08/02/2018  Falls in the past year? 1 1 0 1 No  Number falls in past yr: 1 1 - 0 -  Injury with Fall? 0 0 - 0 -  Risk for fall due to : History of fall(s) - - - -  Follow up Falls prevention discussed Falls evaluation completed - - -   Depression screen [facility] 2/9 08/01/2019 04/07/2019 03/22/2019 03/01/2019 08/02/2018  Decreased Interest 0 0 0 0 0  Down, Depressed, Hopeless 0 0 0 0 0  PHQ - 2 Score 0 0 0 0 0  Altered sleeping - 0 0 0 -  Tired, decreased energy - 0 0 0 -  Change in appetite - 0 0 0 -  Feeling bad or failure about yourself  - 0 0 0 -  Trouble concentrating - 0 0 0 -  Moving slowly or fidgety/restless - 0 0 0 -  Suicidal thoughts - 0 0 0 -  PHQ-9 Score - 0 0 0 -  Difficult doing work/chores - Not difficult at all Not difficult at all Not difficult at all -     Plan: RNCM will send patient successful outreach letter, Tara Shea pamphlet, and magnet. RNCM will complete case closure due to follow up completed / no care management needs.  RNCM will send in-basket update to assigned RNCM Tara Shea).       Tara Shea. Annia Friendly, BSN, [place] Management [facility] Telephonic CM Phone: [phone] Fax: [phone]

## 2019-08-01 NOTE — Telephone Encounter (Signed)
Per Dr Dahlia Byes will order Nystatin oral, use as directed 4 times a day. She will see GYN for the inflamed labia. Prescription has been sent into her pharmacy.

## 2019-08-01 NOTE — Telephone Encounter (Signed)
Spoke with the patient and she reports that she has a white film on her tongue and has tenderness where it is. She also reports an inflamed labia, denies any discharge or itching or burning. She will be seen by GYN for this tomorrow morning. I have made a call to Dr Dahlia Byes about the white film on her tongue.

## 2019-08-01 NOTE — Telephone Encounter (Signed)
Patient complains of possible yeast infection. White film on tongue that began yesterday as well as inflamed labia. Patient had surgery with Dr Dahlia Byes on 07/28/19-laparoscopic cholecystectomy. Patient has a follow up appointment on 08/02/19 with Dr Dahlia Byes at 1:15pm as well as a GYN appointment tomorrow morning. Please contact patient with advise.

## 2019-08-02 ENCOUNTER — Ambulatory Visit (INDEPENDENT_AMBULATORY_CARE_PROVIDER_SITE_OTHER): Payer: 59 | Admitting: Surgery

## 2019-08-02 ENCOUNTER — Other Ambulatory Visit: Payer: Self-pay

## 2019-08-02 ENCOUNTER — Encounter: Payer: Self-pay | Admitting: Obstetrics and Gynecology

## 2019-08-02 ENCOUNTER — Encounter: Payer: Self-pay | Admitting: Internal Medicine

## 2019-08-02 ENCOUNTER — Encounter: Payer: Self-pay | Admitting: Surgery

## 2019-08-02 ENCOUNTER — Ambulatory Visit: Payer: 59 | Admitting: Obstetrics and Gynecology

## 2019-08-02 VITALS — BP 113/72 | HR 91 | Ht 68.0 in | Wt 316.0 lb

## 2019-08-02 VITALS — BP 130/78 | HR 86 | Temp 97.5°F | Ht 68.0 in | Wt 314.0 lb

## 2019-08-02 DIAGNOSIS — B373 Candidiasis of vulva and vagina: Secondary | ICD-10-CM

## 2019-08-02 DIAGNOSIS — B3731 Acute candidiasis of vulva and vagina: Secondary | ICD-10-CM

## 2019-08-02 DIAGNOSIS — N858 Other specified noninflammatory disorders of uterus: Secondary | ICD-10-CM | POA: Diagnosis not present

## 2019-08-02 DIAGNOSIS — N921 Excessive and frequent menstruation with irregular cycle: Secondary | ICD-10-CM | POA: Diagnosis not present

## 2019-08-02 DIAGNOSIS — Z09 Encounter for follow-up examination after completed treatment for conditions other than malignant neoplasm: Secondary | ICD-10-CM

## 2019-08-02 MED ORDER — FLUCONAZOLE 150 MG PO TABS: 150.0000 mg | ORAL_TABLET | Freq: Once | ORAL | 0 refills | Status: AC

## 2019-08-02 NOTE — Progress Notes (Signed)
Leave of absence paperwork faxed to [employer].  Patient was given a copy of this.

## 2019-08-02 NOTE — Patient Instructions (Addendum)
May use heat to the surgical site to help with pain and healing.   May return to work on Thursday September 17th with lifting restrictions. No lifting of more than 20 pounds until after October 16th 2020.  Follow-up with our office as needed.  Please call and ask to speak with a nurse if you develop questions or concerns.

## 2019-08-02 NOTE — Progress Notes (Signed)
S/p lap chole Doing well Some nausea but able to take po No fevers or chills Path d/w pt  PE NAD Abd: soft, appropriate incisional tenderness, no peritonitis. No infection  A/ p Doing very well No heavy lifting total 6 weeks RTC prn

## 2019-08-02 NOTE — Progress Notes (Signed)
Obstetrics & Gynecology Office Visit    Chief Complaint  Patient presents with  . ER follow up    Uterine Fibroid  . Vaginitis    On IV abx x4 days, thinks has yeast infection   History of Present Illness: 38 y.o. G0P0000 female who presents in follow up from a hospitalization for cholelithiasis and acute cholecystitis for which she underwent a laparoscopic cholecystectomy.  During her workup she was noted to have a mass noted in her uterus on CT scan. A pelvic ultrasound noted a heterogeneous, predominantly solid mass within the endometrium, measuring 5.3 cm in size with internal blood flow noted on doppler interrogation.  She states she underwent a myomectomy 9 years ago, but she states that she was told there was one remaining in the myometrium.  In May this year she was diagnosed with another DVT and was started on Xarelto.  Her first DVT was considered provoked. Her most recent DVT was considered unprovoked.  She states that the plan is to have her taken off Xarelto next month in order for her to undergo a more extensive thrombophilia workup, as she has tested positive only for Factor V Leiden heterozygosity in the past.  Her menses are about 7-10 days in length and heavy.  She was changing the largest tampon and pad each half hour.  Her most recent period was not as heavy and she would need to change a pad or tampon (ultra size) every 1.5-2 hours.  She had only been using condoms for birth control.  She received an iron infusion due to heavy menses recently.  Nine years ago she was on warfarin and the same thing happened in terms of heavy bleeding.   She denies unintentional weight loss.  GI symptoms are hard to discern due to recent hospitalization.   Past Medical History:  Diagnosis Date  . Asthma   . Bone spur   . DVT (deep venous thrombosis) ([place])   . Factor 5 Leiden mutation, heterozygous ([place])   . Fibroid   . IDA (iron deficiency anemia)   . Migraines   . PCOS (polycystic ovarian  syndrome)     Past Surgical History:  Procedure Laterality Date  . CHOLECYSTECTOMY N/A 07/28/2019   Procedure: LAPAROSCOPIC CHOLECYSTECTOMY;  Surgeon: Jules Husbands, MD;  Location: [facility] ORS;  Service: General;  Laterality: N/A;  . MYOMECTOMY    . TONSILLECTOMY    . WISDOM TOOTH EXTRACTION    . WRIST SURGERY     age 77 - rebroke it b/c it grew back wrong    Gynecologic History: Patient's last menstrual period was 07/10/2019 (exact date).  Obstetric History: G0P0000  Family History  Problem Relation Age of Onset  . Heart murmur Mother   . Thyroid disease Mother   . Migraines Mother   . Asthma Mother   . Diabetes Mother        TYPE 2  . Bipolar disorder Mother   . Breast cancer Maternal Aunt 35  . Cancer Maternal Uncle        skin  . Cancer Maternal Grandmother 1       colon  . Hypertension Maternal Grandmother   . Cancer Maternal Aunt 50       colon  . Diabetes Maternal Aunt     Social History   Socioeconomic History  . Marital status: Married    Spouse name: Not on file  . Number of children: 0  . Years of education: 16  . Highest  education level: Not on file  Occupational History  . Occupation: Therapist, sports    Comment: ED  Social Needs  . Financial resource strain: Not hard at all  . Food insecurity    Worry: Never true    Inability: Never true  . Transportation needs    Medical: No    Non-medical: No  Tobacco Use  . Smoking status: Never Smoker  . Smokeless tobacco: Never Used  Substance and Sexual Activity  . Alcohol use: Yes    Comment: occ  . Drug use: No  . Sexual activity: Yes    Birth control/protection: Condom  Lifestyle  . Physical activity    Days per week: 0 days    Minutes per session: Not on file  . Stress: Not at all  Relationships  . Social connections    Talks on phone: More than three times a week    Gets together: Once a week    Attends religious service: 1 to 4 times per year    Active member of club or organization: No    Attends  meetings of clubs or organizations: Never    Relationship status: Married  . Intimate partner violence    Fear of current or ex partner: No    Emotionally abused: No    Physically abused: No    Forced sexual activity: No  Other Topics Concern  . 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  No children.        No Known Allergies  Prior to Admission medications   Medication Sig Start Date End Date Taking? Authorizing Provider  albuterol (PROVENTIL HFA;VENTOLIN HFA) 108 (90 Base) MCG/ACT inhaler Inhale 2 puffs into the lungs every 4 (four) hours as needed for wheezing or shortness of breath. 03/01/19  Yes Lada, Satira Anis, MD  albuterol (PROVENTIL) (2.5 MG/3ML) 0.083% nebulizer solution Take 3 mLs (2.5 mg total) by nebulization every 6 (six) hours as needed for wheezing or shortness of breath. 11/28/18  Yes Luvenia Redden, PA-C  ferrous sulfate 325 (65 FE) MG tablet Take 325 mg by mouth daily.   Yes [provider]  fluticasone (FLOVENT HFA) 220 MCG/ACT inhaler Inhale 2 puffs into the lungs 2 (two) times daily. 03/01/19  Yes Lada, Satira Anis, MD  Multiple Vitamin (MULTIVITAMIN WITH MINERALS) TABS tablet Take 1 tablet by mouth daily.   Yes [provider]  nystatin (MYCOSTATIN) 100000 UNIT/ML suspension Take 5 mLs (500,000 Units total) by mouth 4 (four) times daily. 08/01/19  Yes Pabon, Diego F, MD  ondansetron (ZOFRAN ODT) 4 MG disintegrating tablet Take 1 tablet (4 mg total) by mouth every 6 (six) hours as needed for nausea. 07/29/19  Yes Mayo, Pete Pelt, MD  oxyCODONE-acetaminophen (PERCOCET/ROXICET) 5-325 MG tablet Take 1-2 tablets by mouth every 4 (four) hours as needed for moderate pain. 07/29/19  Yes Mayo, Pete Pelt, MD  promethazine (PHENERGAN) 25 MG tablet Take 25 mg by mouth as needed for nausea or vomiting.   Yes [provider]  rivaroxaban (XARELTO) 20 MG TABS tablet Take 1 tablet (20 mg total) by mouth daily with  supper. 05/04/19  Yes Cammie Sickle, MD  Spacer/Aero-Holding Chambers (AEROCHAMBER PLUS) inhaler Use as instructed 05/08/17  Yes Melynda Ripple, MD  vitamin B-12 (CYANOCOBALAMIN) 1000 MCG tablet Take 1 tablet (1,000 mcg total) by mouth daily. 07/29/19  Yes Mayo, Pete Pelt, MD  vitamin B-12 (CYANOCOBALAMIN) 1000 MCG tablet Take 1 tablet (  1,000 mcg total) by mouth daily. 07/29/19  Yes Mayo, Pete Pelt, MD  vitamin C (ASCORBIC ACID) 500 MG tablet Take 500 mg by mouth daily.   Yes [provider]    Review of Systems  Constitutional: Positive for malaise/fatigue. Negative for chills, diaphoresis, fever and weight loss.  HENT: Negative.   Eyes: Negative.   Respiratory: Negative.   Cardiovascular: Negative.   Gastrointestinal: Negative.   Genitourinary: Negative.   Musculoskeletal: Negative.   Skin: Negative.   Neurological: Negative.   Psychiatric/Behavioral: Negative.      Physical Exam BP 113/72 (BP Location: Left Arm, Patient Position: Sitting, Cuff Size: Large)   Pulse 91   Ht 5\' 8"  (1.727 m)   Wt (!) 316 lb (143.3 kg)   LMP 07/10/2019 (Exact Date)   BMI 48.05 kg/m  Patient's last menstrual period was 07/10/2019 (exact date). Physical Exam Constitutional:      General: She is not in acute distress.    Appearance: Normal appearance.  HENT:     Head: Normocephalic and atraumatic.  Eyes:     General: No scleral icterus.    Conjunctiva/sclera: Conjunctivae normal.  Neurological:     General: No focal deficit present.     Mental Status: She is alert and oriented to person, place, and time.     Cranial Nerves: No cranial nerve deficit.  Psychiatric:        Mood and Affect: Mood normal.        Behavior: Behavior normal.        Judgment: Judgment normal.    Female chaperone present for pelvic and breast  portions of the physical exam  Hospital ultrasound report and images reviewed by me.  I agree with the finding of a lower uterine segment lesion.  The lesion was  called a fibroid on ultrasound. However, the characteristics of the lesion might be more consistent with a nabothian cyst.  The intrauterine lesion is cystic with sparse more solid components causing shadowing.  I agree with the differential diagnosis.  This includes, leiomyoma, polyp, carcinoma.  Assessment: 38 y.o. G0P0000 female here for  1. Menorrhagia with irregular cycle   2. Uterine mass   3. Vulvovaginal candidiasis      Plan: Problem List Items Addressed This Visit    None    Visit Diagnoses    Menorrhagia with irregular cycle    -  Primary   Relevant Orders   [place] Sonohysterogram   Uterine mass       Relevant Orders   [place] Sonohysterogram   Vulvovaginal candidiasis       Relevant Medications   fluconazole (DIFLUCAN) 150 MG tablet     Will further characterize the lesion inside her uterus with saline infusion sonohysterography.  Discussed that she will likely need to go to the OR, unless an office sampling is warranted and she is discovered to have an endometrial or uterine cancer.  In that case, she would need gynecologic oncology referral.  30 minutes spent in face to face discussion with > 50% spent in counseling,management, and coordination of care of her Menorrhagia with irregular cycle and uterine mass.   Prentice Docker, MD 08/02/2019 1:56 PM

## 2019-08-03 ENCOUNTER — Other Ambulatory Visit: Payer: Self-pay | Admitting: Internal Medicine

## 2019-08-08 ENCOUNTER — Other Ambulatory Visit: Payer: Self-pay

## 2019-08-08 ENCOUNTER — Other Ambulatory Visit: Payer: Self-pay | Admitting: Obstetrics and Gynecology

## 2019-08-08 DIAGNOSIS — B3731 Acute candidiasis of vulva and vagina: Secondary | ICD-10-CM

## 2019-08-08 DIAGNOSIS — B373 Candidiasis of vulva and vagina: Secondary | ICD-10-CM

## 2019-08-08 MED ORDER — FLUCONAZOLE 150 MG PO TABS: 150.0000 mg | ORAL_TABLET | Freq: Once | ORAL | 0 refills | Status: AC

## 2019-08-09 ENCOUNTER — Ambulatory Visit (INDEPENDENT_AMBULATORY_CARE_PROVIDER_SITE_OTHER): Payer: 59 | Admitting: Family Medicine

## 2019-08-09 ENCOUNTER — Inpatient Hospital Stay: Payer: 59 | Attending: Internal Medicine

## 2019-08-09 ENCOUNTER — Other Ambulatory Visit: Payer: Self-pay | Admitting: Internal Medicine

## 2019-08-09 ENCOUNTER — Encounter: Payer: Self-pay | Admitting: Internal Medicine

## 2019-08-09 ENCOUNTER — Other Ambulatory Visit: Payer: Self-pay

## 2019-08-09 ENCOUNTER — Encounter: Payer: Self-pay | Admitting: Family Medicine

## 2019-08-09 VITALS — BP 119/81 | HR 91 | Temp 97.4°F | Resp 20

## 2019-08-09 DIAGNOSIS — D6851 Activated protein C resistance: Secondary | ICD-10-CM | POA: Diagnosis not present

## 2019-08-09 DIAGNOSIS — D500 Iron deficiency anemia secondary to blood loss (chronic): Secondary | ICD-10-CM | POA: Diagnosis not present

## 2019-08-09 DIAGNOSIS — E538 Deficiency of other specified B group vitamins: Secondary | ICD-10-CM

## 2019-08-09 DIAGNOSIS — D508 Other iron deficiency anemias: Secondary | ICD-10-CM | POA: Diagnosis not present

## 2019-08-09 DIAGNOSIS — Z9049 Acquired absence of other specified parts of digestive tract: Secondary | ICD-10-CM

## 2019-08-09 DIAGNOSIS — N858 Other specified noninflammatory disorders of uterus: Secondary | ICD-10-CM | POA: Diagnosis not present

## 2019-08-09 MED ORDER — SODIUM CHLORIDE 0.9 % IV SOLN
Freq: Once | INTRAVENOUS | Status: AC
  Administered 2019-08-09: 12:00:00 via INTRAVENOUS
  Filled 2019-08-09: qty 250

## 2019-08-09 MED ORDER — PROMETHAZINE HCL 25 MG PO TABS: 25.0000 mg | ORAL_TABLET | Freq: Three times a day (TID) | ORAL | 0 refills | Status: DC | PRN

## 2019-08-09 MED ORDER — IRON SUCROSE 20 MG/ML IV SOLN
200.0000 mg | Freq: Once | INTRAVENOUS | Status: AC
  Administered 2019-08-09: 200 mg via INTRAVENOUS
  Filled 2019-08-09: qty 10

## 2019-08-09 NOTE — Progress Notes (Signed)
Name: Tara Shea   MRN: [id number]    DOB: 04-Apr-1981   Date:08/09/2019       Progress Note  Subjective  Chief Complaint  Chief Complaint  Patient presents with  . Hospitalization Follow-up    I connected with  [name]  on 08/09/19 at  8:00 AM EDT by a video enabled telemedicine application and verified that I am speaking with the correct person using two identifiers.  I discussed the limitations of evaluation and management by telemedicine and the availability of in person appointments. The patient expressed understanding and agreed to proceed. Staff also discussed with the patient that there may be a patient responsible charge related to this service. Patient Location: Home Provider Location: Home Additional Individuals present: None  HPI   Pt presents for hospital discharge follow up. She initially presented to the ED with nausea, vomiting, and right upper quadrant abdominal pain.  In the ED, right upper quadrant ultrasound showed cholelithiasis with a 2.7 cm stone in the gallbladder neck, without evidence of acute cholecystitis.  CT abdomen/pelvis showed an enlarged appearance of the uterus with a lobular heterogeneity at the uterine fundus.  She was admitted for further management.  Acute cholecystitis-s/plaparoscopic cholecystectomy 9/4 -Gallbladder findings during surgery were consistent with cholecystitis -Treated with Zosyn, but antibiotics were stopped once gallbladder was removed - She had outpatient visit with Dr. Dahlia Byes (gen surg) on 08/02/2019 and was doing well at that visit with routine wound healing. - She denies nausea/vomiting now, and her pain is much improved.  She denies abnormal BM's - taking docusate to help with any constipation.  Having BM every other day.   Uterinemass- seen on CT abdomen/pelvis -Urine pregnancy test was negative, beta-hCG negative. -Transvaginal US heterogeneous, predominantly solid mass within the endometrium,  measuring 5.3 cm in size - She had outpatient follow up with Dr. Glennon Mac who ordered a saline HSG to better determine the characteristics of the mass. Awaiting this procedure - this is scheduled for 08/11/2019.  Vitamin B12 deficiency- B12 found to be low this admission -Given a dose of vitamin B12 IM and prescribed vitamin B12 tablets on discharge -Remainder of anemia panel was unremarkable and stable.  She is already set up with Dr. Rogue Bussing, however she is notified that if she needs B12 injections from our office we are happy to coordinate this with Dr. Rogue Bussing. - Follow-up with hematology as an outpatient is scheduled for October 28; she has labs and iron infusions ordered prior to this follow up.  Factor V Leiden deficiency with history of DVT- on xarelto at home -Patient was on heparin drip prior to surgery, but Xarelto was restarted 24 hours after surgery.  She has upcoming labs with Dr. Rogue Bussing in October to further evaluate her clotting disorder.  FMLA - Dr. Dahlia Byes did do FMLA for her hospital stay; She is having possible procedure with Dr. Glennon Mac for removal of uterine mass, and he will manage her FMLA for this.   Patient Active Problem List   Diagnosis Date Noted  . Cholelithiasis 07/26/2019  . Iron deficiency anemia due to chronic blood loss 06/22/2019  . Acute deep vein thrombosis (DVT) of calf muscle vein of right lower extremity 04/18/2019  . Factor 5 Leiden mutation, heterozygous ([place])   . Allergic rhinitis 03/01/2019  . Kidney stone 08/02/2018  . Asthma, mild intermittent 08/02/2018  . Migraine headache with aura 08/02/2018  . Morbid obesity ([place]) 08/02/2018    Past Surgical History:  Procedure Laterality Date  .  CHOLECYSTECTOMY N/A 07/28/2019   Procedure: LAPAROSCOPIC CHOLECYSTECTOMY;  Surgeon: Jules Husbands, MD;  Location: [facility] ORS;  Service: General;  Laterality: N/A;  . MYOMECTOMY    . TONSILLECTOMY    . WISDOM TOOTH EXTRACTION    . WRIST SURGERY      age 11 - rebroke it b/c it grew back wrong    Family History  Problem Relation Age of Onset  . Heart murmur Mother   . Thyroid disease Mother   . Migraines Mother   . Asthma Mother   . Diabetes Mother        TYPE 2  . Bipolar disorder Mother   . Breast cancer Maternal Aunt 54  . Cancer Maternal Uncle        skin  . Cancer Maternal Grandmother 63       colon  . Hypertension Maternal Grandmother   . Cancer Maternal Aunt 50       colon  . Diabetes Maternal Aunt     Social History   Socioeconomic History  . Marital status: Married    Spouse name: Not on file  . Number of children: 0  . Years of education: 63  . Highest education level: Not on file  Occupational History  . Occupation: Therapist, sports    Comment: ED  Social Needs  . Financial resource strain: Not hard at all  . Food insecurity    Worry: Never true    Inability: Never true  . Transportation needs    Medical: No    Non-medical: No  Tobacco Use  . Smoking status: Never Smoker  . Smokeless tobacco: Never Used  Substance and Sexual Activity  . Alcohol use: Yes    Comment: occ  . Drug use: No  . Sexual activity: Yes    Birth control/protection: Condom  Lifestyle  . Physical activity    Days per week: 0 days    Minutes per session: Not on file  . Stress: Not at all  Relationships  . Social connections    Talks on phone: More than three times a week    Gets together: Once a week    Attends religious service: 1 to 4 times per year    Active member of club or organization: No    Attends meetings of clubs or organizations: Never    Relationship status: Married  . Intimate partner violence    Fear of current or ex partner: No    Emotionally abused: No    Physically abused: No    Forced sexual activity: No  Other Topics Concern  . 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  No children.         Current Outpatient Medications:  .  albuterol (PROVENTIL  HFA;VENTOLIN HFA) 108 (90 Base) MCG/ACT inhaler, Inhale 2 puffs into the lungs every 4 (four) hours as needed for wheezing or shortness of breath., Disp: 1 Inhaler, Rfl: 2 .  albuterol (PROVENTIL) (2.5 MG/3ML) 0.083% nebulizer solution, Take 3 mLs (2.5 mg total) by nebulization every 6 (six) hours as needed for wheezing or shortness of breath., Disp: 75 mL, Rfl: 12 .  ferrous sulfate 325 (65 FE) MG tablet, Take 325 mg by mouth daily., Disp: , Rfl:  .  fluticasone (FLOVENT HFA) 220 MCG/ACT inhaler, Inhale 2 puffs into the lungs 2 (two) times daily., Disp: 1 Inhaler, Rfl: 12 .  Multiple Vitamin (MULTIVITAMIN WITH MINERALS) TABS tablet, Take 1  tablet by mouth daily., Disp: , Rfl:  .  ondansetron (ZOFRAN ODT) 4 MG disintegrating tablet, Take 1 tablet (4 mg total) by mouth every 6 (six) hours as needed for nausea., Disp: 20 tablet, Rfl: 0 .  oxyCODONE-acetaminophen (PERCOCET/ROXICET) 5-325 MG tablet, Take 1-2 tablets by mouth every 4 (four) hours as needed for moderate pain., Disp: 15 tablet, Rfl: 0 .  promethazine (PHENERGAN) 25 MG tablet, Take 25 mg by mouth as needed for nausea or vomiting., Disp: , Rfl:  .  rivaroxaban (XARELTO) 20 MG TABS tablet, Take 1 tablet (20 mg total) by mouth daily with supper., Disp: 30 tablet, Rfl: 3 .  Spacer/Aero-Holding Chambers (AEROCHAMBER PLUS) inhaler, Use as instructed, Disp: 1 each, Rfl: 2 .  vitamin B-12 (CYANOCOBALAMIN) 1000 MCG tablet, Take 1 tablet (1,000 mcg total) by mouth daily., Disp: 30 tablet, Rfl: 0 .  vitamin C (ASCORBIC ACID) 500 MG tablet, Take 500 mg by mouth daily., Disp: , Rfl:  .  nystatin (MYCOSTATIN) 100000 UNIT/ML suspension, Take 5 mLs (500,000 Units total) by mouth 4 (four) times daily. (Patient not taking: Reported on 08/09/2019), Disp: 60 mL, Rfl: 0  No Known Allergies  I personally reviewed active problem list, medication list, allergies, notes from last encounter, lab results, imaging with the patient/caregiver today.   ROS   Constitutional: Negative for fever or weight change.  Respiratory: Negative for cough and shortness of breath.   Cardiovascular: Negative for chest pain or palpitations.  Gastrointestinal: Negative for abdominal pain, no bowel changes.  Musculoskeletal: See HPI Skin: Negative for rash.  Neurological: Negative for dizziness or headache.  No other specific complaints in a complete review of systems (except as listed in HPI above).  Objective  Virtual encounter, vitals not obtained.  There is no height or weight on file to calculate BMI.  Physical Exam  Constitutional: Patient appears well-developed and well-nourished. No distress.  HENT: Head: Normocephalic and atraumatic.  Neck: Normal range of motion. Pulmonary/Chest: Effort normal. No respiratory distress. Speaking in complete sentences Neurological: Pt is alert and oriented to person, place, and time. Coordination, speech are normal. Psychiatric: Patient has a normal mood and affect. behavior is normal. Judgment and thought content normal.  No results found for this or any previous visit (from the past 72 hour(s)).  PHQ2/9: Depression screen [facility] 2/9 08/09/2019 08/01/2019 04/07/2019 03/22/2019 03/01/2019  Decreased Interest 0 0 0 0 0  Down, Depressed, Hopeless 0 0 0 0 0  PHQ - 2 Score 0 0 0 0 0  Altered sleeping 0 - 0 0 0  Tired, decreased energy 0 - 0 0 0  Change in appetite 0 - 0 0 0  Feeling bad or failure about yourself  0 - 0 0 0  Trouble concentrating 0 - 0 0 0  Moving slowly or fidgety/restless 0 - 0 0 0  Suicidal thoughts 0 - 0 0 0  PHQ-9 Score 0 - 0 0 0  Difficult doing work/chores Not difficult at all - Not difficult at all Not difficult at all Not difficult at all   PHQ-2/9 Result is negative.    Fall Risk: Fall Risk  08/09/2019 08/02/2019 08/01/2019 04/07/2019 03/22/2019  Falls in the past year? 0 0 1 1 0  Number falls in past yr: 0 0 1 1 -  Injury with Fall? 0 0 0 0 -  Risk for fall due to : - - History of fall(s) - -   Follow up Falls evaluation completed - Falls prevention discussed Falls  evaluation completed -    Assessment & Plan  1. S/P cholecystectomy - Routine healing at this point; did discuss monitoring BM's for diarrhea/dumping syndrome.  Doing well at this time.  2. Uterine mass - Seeing Dr. Glennon Mac, has HSG scheduled for this Friday.  Her menses is still heavy, but tolerating without concern today.  3. B12 deficiency - She is taking PO B12; did recommend sublingual as this has better absorption.  Also advised that she may discuss with Dr. Rogue Bussing, and if B12 injections are recommended, we are happy to coordinate with our office for these if needed.  4. Iron deficiency anemia due to chronic blood loss - Seeing Heme/Onc for evaluation  5. Factor 5 Leiden mutation, heterozygous ([place]) - Seeing Heme/Onc for evaluation  I discussed the assessment and treatment plan with the patient. The patient was provided an opportunity to ask questions and all were answered. The patient agreed with the plan and demonstrated an understanding of the instructions.  The patient was advised to call back or seek an in-person evaluation if the symptoms worsen or if the condition fails to improve as anticipated.  I provided 26 minutes of non-face-to-face time during this encounter.

## 2019-08-11 ENCOUNTER — Encounter: Payer: 59 | Admitting: Obstetrics and Gynecology

## 2019-08-11 ENCOUNTER — Other Ambulatory Visit: Payer: Self-pay

## 2019-08-11 ENCOUNTER — Ambulatory Visit (INDEPENDENT_AMBULATORY_CARE_PROVIDER_SITE_OTHER): Payer: 59 | Admitting: Obstetrics and Gynecology

## 2019-08-11 ENCOUNTER — Encounter: Payer: Self-pay | Admitting: Obstetrics and Gynecology

## 2019-08-11 ENCOUNTER — Ambulatory Visit (INDEPENDENT_AMBULATORY_CARE_PROVIDER_SITE_OTHER): Payer: 59

## 2019-08-11 VITALS — BP 128/74 | Wt 320.0 lb

## 2019-08-11 DIAGNOSIS — D250 Submucous leiomyoma of uterus: Secondary | ICD-10-CM | POA: Diagnosis not present

## 2019-08-11 DIAGNOSIS — N921 Excessive and frequent menstruation with irregular cycle: Secondary | ICD-10-CM

## 2019-08-11 DIAGNOSIS — N858 Other specified noninflammatory disorders of uterus: Secondary | ICD-10-CM

## 2019-08-11 DIAGNOSIS — D251 Intramural leiomyoma of uterus: Secondary | ICD-10-CM

## 2019-08-11 NOTE — Progress Notes (Signed)
Procedure Saline Infusion Sonohysterogram:  After obtaining informed consent, the patient was placed in the dorsal supine lithotomy position.  A bivalved speculum was placed in her vagina with visualization of the cervix.  The cervix was prepped x 2 with betadine.  A single tooth tenaculum was not utilized to place the catheter.  A small sonohysterogram catheter was threaded through the cervix.  The speculum was removed from the vagina.  The transvaginal ultrasound probe was placed in the vagina.  A total of 60  mL of sterile saline was injected in the uterus with capture and image reconstruction using 3-D ultrasound.  The patient tolerated the procedure well.  The above-noted findings were appreciated.  See the plan in my clinic note from today for discussion.   Prentice Docker, MD, [facility], [facility] 08/11/2019 2:34 PM

## 2019-08-11 NOTE — Progress Notes (Signed)
Gynecology Ultrasound Follow Up   Chief Complaint  Patient presents with  . Follow-up  Ultrasound and saline infusion sonohysterogram for menorrhagia with irregular cycle   History of Present Illness: Patient is a 38 y.o. female who presents today for ultrasound evaluation of the above .  Ultrasound demonstrates the following findings Adnexa: no masses seen  Uterus: anteverted with endometrial stripe  2.6 mm Additional: submucosal fibroid measuring 7 x 5.3 x 5.5 cm (> 50% submucosal), another small intramural fibroid measuring 1.3 x 1.3 x 1.6 cm.    Past Medical History:  Diagnosis Date  . Asthma   . Bone spur   . DVT (deep venous thrombosis) ([place])   . Factor 5 Leiden mutation, heterozygous ([place])   . Fibroid   . IDA (iron deficiency anemia)   . Migraines   . PCOS (polycystic ovarian syndrome)     Past Surgical History:  Procedure Laterality Date  . CHOLECYSTECTOMY N/A 07/28/2019   Procedure: LAPAROSCOPIC CHOLECYSTECTOMY;  Surgeon: Jules Husbands, MD;  Location: [facility] ORS;  Service: General;  Laterality: N/A;  . MYOMECTOMY    . TONSILLECTOMY    . WISDOM TOOTH EXTRACTION    . WRIST SURGERY     age 29 - rebroke it b/c it grew back wrong    Family History  Problem Relation Age of Onset  . Heart murmur Mother   . Thyroid disease Mother   . Migraines Mother   . Asthma Mother   . Diabetes Mother        TYPE 2  . Bipolar disorder Mother   . Breast cancer Maternal Aunt 14  . Cancer Maternal Uncle        skin  . Cancer Maternal Grandmother 39       colon  . Hypertension Maternal Grandmother   . Cancer Maternal Aunt 50       colon  . Diabetes Maternal Aunt     Social History   Socioeconomic History  . Marital status: Married    Spouse name: Not on file  . Number of children: 0  . Years of education: 71  . Highest education level: Not on file  Occupational History  . Occupation: Therapist, sports    Comment: ED  Social Needs  . Financial resource strain: Not hard at all   . Food insecurity    Worry: Never true    Inability: Never true  . Transportation needs    Medical: No    Non-medical: No  Tobacco Use  . Smoking status: Never Smoker  . Smokeless tobacco: Never Used  Substance and Sexual Activity  . Alcohol use: Yes    Comment: occ  . Drug use: No  . Sexual activity: Yes    Birth control/protection: Condom  Lifestyle  . Physical activity    Days per week: 0 days    Minutes per session: Not on file  . Stress: Not at all  Relationships  . Social connections    Talks on phone: More than three times a week    Gets together: Once a week    Attends religious service: 1 to 4 times per year    Active member of club or organization: No    Attends meetings of clubs or organizations: Never    Relationship status: Married  . Intimate partner violence    Fear of current or ex partner: No    Emotionally abused: No    Physically abused: No    Forced sexual activity: No  Other Topics Concern  . 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  No children.        No Known Allergies  Prior to Admission medications   Medication Sig Start Date End Date Taking? Authorizing Provider  albuterol (PROVENTIL HFA;VENTOLIN HFA) 108 (90 Base) MCG/ACT inhaler Inhale 2 puffs into the lungs every 4 (four) hours as needed for wheezing or shortness of breath. 03/01/19   Lada, Satira Anis, MD  albuterol (PROVENTIL) (2.5 MG/3ML) 0.083% nebulizer solution Take 3 mLs (2.5 mg total) by nebulization every 6 (six) hours as needed for wheezing or shortness of breath. 11/28/18   Luvenia Redden, PA-C  ferrous sulfate 325 (65 FE) MG tablet Take 325 mg by mouth daily.    [provider]  fluticasone (FLOVENT HFA) 220 MCG/ACT inhaler Inhale 2 puffs into the lungs 2 (two) times daily. 03/01/19   Arnetha Courser, MD  Multiple Vitamin (MULTIVITAMIN WITH MINERALS) TABS tablet Take 1 tablet by mouth daily.    [provider]   nystatin (MYCOSTATIN) 100000 UNIT/ML suspension Take 5 mLs (500,000 Units total) by mouth 4 (four) times daily. Patient not taking: Reported on 08/09/2019 08/01/19   Pabon, Bea Graff F, MD  ondansetron (ZOFRAN ODT) 4 MG disintegrating tablet Take 1 tablet (4 mg total) by mouth every 6 (six) hours as needed for nausea. 07/29/19   Mayo, Pete Pelt, MD  oxyCODONE-acetaminophen (PERCOCET/ROXICET) 5-325 MG tablet Take 1-2 tablets by mouth every 4 (four) hours as needed for moderate pain. 07/29/19   Mayo, Pete Pelt, MD  promethazine (PHENERGAN) 25 MG tablet Take 1 tablet (25 mg total) by mouth every 8 (eight) hours as needed for nausea or vomiting. 08/09/19   Hubbard Hartshorn, FNP  rivaroxaban (XARELTO) 20 MG TABS tablet Take 1 tablet (20 mg total) by mouth daily with supper. 05/04/19   Cammie Sickle, MD  Spacer/Aero-Holding Chambers (AEROCHAMBER PLUS) inhaler Use as instructed 05/08/17   Melynda Ripple, MD  vitamin B-12 (CYANOCOBALAMIN) 1000 MCG tablet Take 1 tablet (1,000 mcg total) by mouth daily. 07/29/19   Mayo, Pete Pelt, MD  vitamin C (ASCORBIC ACID) 500 MG tablet Take 500 mg by mouth daily.    [provider]    Physical Exam BP 128/74   Wt (!) 320 lb (145.2 kg)   BMI 48.66 kg/m    General: NAD HEENT: normocephalic, anicteric Pulmonary: No increased work of breathing Extremities: no edema, erythema, or tenderness Neurologic: Grossly intact, normal gait Psychiatric: mood appropriate, affect full  Imaging Results [place] Sonohysterogram  Result Date: 08/11/2019 Patient Name: SCHUYLAR BEHANNA DOB: 09-12-1981 MRN: [id number] ULTRASOUND REPORT Location: [facility] Date of Service: 08/11/2019 Indications:Enlarged Uterus with submucosal mass. SIS performed. Findings: The uterus is anteverted and measures 13.3 x 9.1 x 8.9 cm. Echo texture is heterogenous with evidence of focal masses. Within the uterus are multiple suspected fibroids measuring: Fibroid 1: 70 x 53 x 55 mm greater than 50%  submucosal Fibroid 2: 13 x 13 x 16 mm intramural anterior The Endometrium measures 2.6 mm. Right Ovary measures 2.9 x 1.8 x 2.1 cm. It is normal in appearance. Left Ovary is not visualized. Survey of the adnexa demonstrates no adnexal masses. There is no free fluid in the cul de sac. Impression: 1. There is a large submucosal fibroid seen. 2. The endometrial lining appears thin 3. Normal appearing right ovary 4. A Saline Infusion Sonohystegram was performed today highlighting the above  findings.  See procedure note. Gweneth Dimitri, RT The ultrasound images and findings were reviewed by me and I agree with the above report. Prentice Docker, MD, [facility], [facility] 08/11/2019 2:28 PM       Assessment: 38 y.o. G0P0000  1. Menorrhagia with irregular cycle   2. Uterine mass   3. Intramural and submucous leiomyoma of uterus      Plan: Problem List Items Addressed This Visit    None    Visit Diagnoses    Menorrhagia with irregular cycle    -  Primary   Uterine mass       Intramural and submucous leiomyoma of uterus          Discussed treatment options for this type of fibroid.  We discussed hysterectomy.  We discussed submucosal myomectomy.  The patient is not interested in hysterectomy at this time.  We discussed hysteroscopy with dilation and curettage and submucosal myomectomy.  We discussed some of the challenges with a large fibroid, such as the one she has, and myomectomy.  With this approach we can reduce the fibroid.  However, it may take more than 1 attempt in the OR, given limitations on fluid deficit that may arise with a longer surgery.  Understanding all of this, she would like to proceed with the above procedure of hysteroscopy, dilation curettage, and myomectomy.  15 minutes spent in face to face discussion with > 50% spent in counseling,management, and coordination of care of her menorrhagia with irregular cycle, uterine mass, fibroid that is intramural  and submucosal.  Prentice Docker, MD, [place] 08/11/2019 2:32 PM

## 2019-08-14 ENCOUNTER — Telehealth: Payer: Self-pay | Admitting: Obstetrics and Gynecology

## 2019-08-14 NOTE — Telephone Encounter (Signed)
I spoke to the patient, who needs to know if Dr. Glennon Mac has talked to Dr. Rogue Bussing? Patient is leaning toward the Oct 8th OR date.

## 2019-08-14 NOTE — Telephone Encounter (Signed)
Lmtrc

## 2019-08-14 NOTE — Telephone Encounter (Signed)
-----   Message from Will Bonnet, MD sent at 08/11/2019  1:38 PM EDT ----- Regarding: Schedule Surgery Surgery Booking Request Patient Full Name:  Tara Shea  MRN: [id number]  DOB: 1981/03/14  Surgeon: Prentice Docker, MD  Requested Surgery Date and Time: TBD Primary Diagnosis AND Code:   1. Menorrhagia with irregular cycle (N92.1) 2. Uterine mass (N85.8)  3. Intramural and submucous leiomyoma of uterus (D25.1, D25.0)  Secondary Diagnosis and Code: Factor V Leiden and history of DVT Surgical Procedure: Hysteroscopy, Dilation and curettage, Submucosal myomectomy  L&D Notification: No Admission Status: same day surgery Length of Surgery: 1 hour Special Case Needs: Biggest myosure device (rep, if possible) H&P: TBD (date) Phone Interview???: yes Interpreter: Language:  Medical Clearance: no Special Scheduling Instructions: biggest myosure device and rep if possible Acuity: P3

## 2019-08-15 ENCOUNTER — Telehealth: Payer: Self-pay | Admitting: Internal Medicine

## 2019-08-15 NOTE — Telephone Encounter (Signed)
Patient is aware I will contact her after Dr. Glennon Mac hears back from Dr. Rogue Bussing.

## 2019-08-15 NOTE — Telephone Encounter (Signed)
Dr. Gildardo Griffes is OK with proceeding at that time. His plan was to discontinue her medication at the end of September.

## 2019-08-15 NOTE — Telephone Encounter (Signed)
A message has been sent.  I'll let her know once I hear back.

## 2019-08-15 NOTE — Telephone Encounter (Signed)
My chart message to pt.  FYI-Dr.Jackson ----------------------------------------------------------------  Hi Tara Shea got a message from Dr. Jackson/gynecology regarding planned hysteroscopic fibroid surgery plan tentatively on October 8.  I think it is reasonable to discontinue Eliquis on September 30th/4 months of anticoagulation.   Continue labs iron infusion/B12/follow-up as planned. Call us if any questions.

## 2019-08-16 ENCOUNTER — Other Ambulatory Visit: Payer: Self-pay

## 2019-08-16 ENCOUNTER — Inpatient Hospital Stay: Payer: 59

## 2019-08-16 VITALS — BP 110/68 | HR 76 | Temp 97.0°F | Resp 20

## 2019-08-16 DIAGNOSIS — D500 Iron deficiency anemia secondary to blood loss (chronic): Secondary | ICD-10-CM

## 2019-08-16 DIAGNOSIS — D508 Other iron deficiency anemias: Secondary | ICD-10-CM | POA: Diagnosis not present

## 2019-08-16 DIAGNOSIS — E538 Deficiency of other specified B group vitamins: Secondary | ICD-10-CM

## 2019-08-16 MED ORDER — IRON SUCROSE 20 MG/ML IV SOLN
200.0000 mg | Freq: Once | INTRAVENOUS | Status: AC
  Administered 2019-08-16: 200 mg via INTRAVENOUS
  Filled 2019-08-16: qty 10

## 2019-08-16 MED ORDER — SODIUM CHLORIDE 0.9 % IV SOLN
Freq: Once | INTRAVENOUS | Status: AC
  Administered 2019-08-16: 14:00:00 via INTRAVENOUS
  Filled 2019-08-16: qty 250

## 2019-08-16 MED ORDER — CYANOCOBALAMIN 1000 MCG/ML IJ SOLN
1000.0000 ug | Freq: Once | INTRAMUSCULAR | Status: AC
  Administered 2019-08-16: 14:00:00 1000 ug via INTRAMUSCULAR
  Filled 2019-08-16: qty 1

## 2019-08-16 NOTE — Telephone Encounter (Signed)
Lmtrc

## 2019-08-16 NOTE — Telephone Encounter (Signed)
Scheduled for 08/31/19. Patient is aware of H&P on 08/21/19 @ 4:30pm, Pre-admit testing to be scheduled, Covid testing on 08/28/19 @ 8-10:30pm, and OR on 08/31/19.

## 2019-08-21 ENCOUNTER — Other Ambulatory Visit: Payer: Self-pay

## 2019-08-21 ENCOUNTER — Encounter: Payer: Self-pay | Admitting: Obstetrics and Gynecology

## 2019-08-21 ENCOUNTER — Ambulatory Visit (INDEPENDENT_AMBULATORY_CARE_PROVIDER_SITE_OTHER): Payer: 59 | Admitting: Obstetrics and Gynecology

## 2019-08-21 VITALS — BP 128/88 | Ht 68.0 in | Wt 320.0 lb

## 2019-08-21 DIAGNOSIS — N921 Excessive and frequent menstruation with irregular cycle: Secondary | ICD-10-CM | POA: Diagnosis not present

## 2019-08-21 DIAGNOSIS — D500 Iron deficiency anemia secondary to blood loss (chronic): Secondary | ICD-10-CM | POA: Diagnosis not present

## 2019-08-21 DIAGNOSIS — N946 Dysmenorrhea, unspecified: Secondary | ICD-10-CM

## 2019-08-21 DIAGNOSIS — D251 Intramural leiomyoma of uterus: Secondary | ICD-10-CM | POA: Diagnosis not present

## 2019-08-21 DIAGNOSIS — D6851 Activated protein C resistance: Secondary | ICD-10-CM

## 2019-08-21 DIAGNOSIS — D250 Submucous leiomyoma of uterus: Secondary | ICD-10-CM

## 2019-08-21 NOTE — H&P (View-Only) (Signed)
Preoperative History and Physical  Tara Shea is a 38 y.o. G0P0000 here for surgical management of menorrhagia and submucosal fibroid.     History of Present Illness: 38 y.o. G0P0000 female who presents for pre-operative evaluation for the above.  During a workup during a recent hospitalization she was noted to have a mass noted in her uterus on CT scan. A pelvic ultrasound noted a heterogeneous, predominantly solid mass within the endometrium, measuring 5.3 cm in size with internal blood flow noted on doppler interrogation.  She states she underwent a myomectomy 9 years ago, but she states that she was told there was one remaining in the myometrium.  In May this year she was diagnosed with another DVT and was started on Xarelto.  Her first DVT was considered provoked. Her most recent DVT was considered unprovoked.  She states that the plan is to have her taken off Xarelto next month in order for her to undergo a more extensive thrombophilia workup, as she has tested positive only for Factor V Leiden heterozygosity in the past.  Her menses are about 7-10 days in length and heavy.  She was changing the largest tampon and pad each half hour.  Her most recent period was not as heavy and she would need to change a pad or tampon (ultra size) every 1.5-2 hours.  She had only been using condoms for birth control.  She received an iron infusion due to heavy menses recently.  Nine years ago she was on warfarin and the same thing happened in terms of heavy bleeding.   She denies unintentional weight loss.  GI symptoms are hard to discern due to recent hospitalization.   She has undergone a saline infusion sonohysterogram that confirmed the fibroid. The measurement of the fibroid at that time was 8 x 5.3 x 5.5 cm with a mostly submucosal location.    She is due to stop taking Xarelto at the end of this month as directed by her hematologist. This should allow a safe surgical situation from a bleeding  standpoint.   Proposed surgery: Hysteroscopy, dilation and curettage, submucosal myomectomy.   Past Medical History:  Diagnosis Date   Asthma    Bone spur    DVT (deep venous thrombosis) (HCC)    Factor 5 Leiden mutation, heterozygous (HCC)    Fibroid    IDA (iron deficiency anemia)    Migraines    PCOS (polycystic ovarian syndrome)    Past Surgical History:  Procedure Laterality Date   CHOLECYSTECTOMY N/A 07/28/2019   Procedure: LAPAROSCOPIC CHOLECYSTECTOMY;  Surgeon: Jules Husbands, MD;  Location: [facility] ORS;  Service: General;  Laterality: N/A;   MYOMECTOMY     TONSILLECTOMY     WISDOM TOOTH EXTRACTION     WRIST SURGERY     age 32 - rebroke it b/c it grew back wrong   OB History  Gravida Para Term Preterm AB Living  0 0 0 0 0 0  SAB TAB Ectopic Multiple Live Births  0 0 0 0 0  Patient denies any other pertinent gynecologic issues.   Current Outpatient Medications on File Prior to Visit  Medication Sig Dispense Refill   albuterol (PROVENTIL HFA;VENTOLIN HFA) 108 (90 Base) MCG/ACT inhaler Inhale 2 puffs into the lungs every 4 (four) hours as needed for wheezing or shortness of breath. 1 Inhaler 2   albuterol (PROVENTIL) (2.5 MG/3ML) 0.083% nebulizer solution Take 3 mLs (2.5 mg total) by nebulization every 6 (six) hours as needed  for wheezing or shortness of breath. 75 mL 12   ferrous sulfate 325 (65 FE) MG tablet Take 325 mg by mouth daily.     fluticasone (FLOVENT HFA) 220 MCG/ACT inhaler Inhale 2 puffs into the lungs 2 (two) times daily. 1 Inhaler 12   Multiple Vitamin (MULTIVITAMIN WITH MINERALS) TABS tablet Take 1 tablet by mouth daily.     nystatin (MYCOSTATIN) 100000 UNIT/ML suspension Take 5 mLs (500,000 Units total) by mouth 4 (four) times daily. (Patient not taking: Reported on 08/09/2019) 60 mL 0   ondansetron (ZOFRAN ODT) 4 MG disintegrating tablet Take 1 tablet (4 mg total) by mouth every 6 (six) hours as needed for nausea. 20 tablet 0    oxyCODONE-acetaminophen (PERCOCET/ROXICET) 5-325 MG tablet Take 1-2 tablets by mouth every 4 (four) hours as needed for moderate pain. 15 tablet 0   promethazine (PHENERGAN) 25 MG tablet Take 1 tablet (25 mg total) by mouth every 8 (eight) hours as needed for nausea or vomiting. 30 tablet 0   rivaroxaban (XARELTO) 20 MG TABS tablet Take 1 tablet (20 mg total) by mouth daily with supper. 30 tablet 3   Spacer/Aero-Holding Chambers (AEROCHAMBER PLUS) inhaler Use as instructed 1 each 2   vitamin B-12 (CYANOCOBALAMIN) 1000 MCG tablet Take 1 tablet (1,000 mcg total) by mouth daily. 30 tablet 0   vitamin C (ASCORBIC ACID) 500 MG tablet Take 500 mg by mouth daily.     No current facility-administered medications on file prior to visit.    No Known Allergies  Social History:   reports that she has never smoked. She has never used smokeless tobacco. She reports current alcohol use. She reports that she does not use drugs.  Family History  Problem Relation Age of Onset   Heart murmur Mother    Thyroid disease Mother    Migraines Mother    Asthma Mother    Diabetes Mother        TYPE 2   Bipolar disorder Mother    Breast cancer Maternal Aunt 51   Cancer Maternal Uncle        skin   Cancer Maternal Grandmother 51       colon   Hypertension Maternal Grandmother    Cancer Maternal Aunt 50       colon   Diabetes Maternal Aunt     Review of Systems: Noncontributory  PHYSICAL EXAM: Blood pressure 128/88, height 5\' 8"  (1.727 m), weight (!) 320 lb (145.2 kg). CONSTITUTIONAL: Well-developed, well-nourished female in no acute distress.  HENT:  Normocephalic, atraumatic, External right and left ear normal. Oropharynx is clear and moist EYES: Conjunctivae and EOM are normal. Pupils are equal, round, and reactive to light. No scleral icterus.  NECK: Normal range of motion, supple, no masses SKIN: Skin is warm and dry. No rash noted. Not diaphoretic. No erythema. No  pallor. [place]: Alert and oriented to person, place, and time. Normal reflexes, muscle tone coordination. No cranial nerve deficit noted. PSYCHIATRIC: Normal mood and affect. Normal behavior. Normal judgment and thought content. CARDIOVASCULAR: Normal heart rate noted, regular rhythm RESPIRATORY: Effort and breath sounds normal, no problems with respiration noted ABDOMEN: Soft, nontender, nondistended. PELVIC: Deferred MUSCULOSKELETAL: Normal range of motion. No edema and no tenderness. 2+ distal pulses.  Labs: No results found for this or any previous visit (from the past 336 hour(s)).  Imaging Studies: Ct Abdomen Pelvis W Contrast  Result Date: 07/26/2019 CLINICAL DATA:  Abdominal pain, acute generalized EXAM: CT ABDOMEN AND PELVIS WITH CONTRAST  TECHNIQUE: Multidetector CT imaging of the abdomen and pelvis was performed using the standard protocol following bolus administration of intravenous contrast. CONTRAST:  142mL OMNIPAQUE IOHEXOL 300 MG/ML  SOLN COMPARISON:  CT 02/15/2007 FINDINGS: Lower chest: Basilar areas of atelectasis. Normal heart size. No pericardial effusion. Hepatobiliary: No focal hepatic abnormality. Mild periportal edema. Few calcified gallstones within the body of the gallbladder. No pericholecystic inflammation. No gallbladder wall thickening. No biliary ductal dilatation or calcified intraductal gallstones. Pancreas: Unremarkable. No pancreatic ductal dilatation or surrounding inflammatory changes. Spleen: Normal in size without focal abnormality. Adrenals/Urinary Tract: Adrenal glands are unremarkable. Kidneys are normal, without renal calculi, focal lesion, or hydronephrosis. Mild bladder wall thickening though urinary bladder is largely decompressed at the time of exam and therefore poorly evaluated by CT imaging. Stomach/Bowel: Distal esophagus, stomach and duodenal sweep are unremarkable. No bowel wall thickening or dilatation. No evidence of obstruction. A normal  appendix is visualized. Vascular/Lymphatic: Normal caliber aorta. No aneurysm or ectasia. No suspicious or enlarged lymph nodes in the included lymphatic chains. Reproductive: There is an enlarged appearance of the anteverted uterus with lobular heterogeneity at the uterine fundus associated with the endometrial stripe and a small hypoattenuating 11 mm structure seen centrally there is mild thickening of the endometrial stripe at the lower uterine segment. Few dominant follicles are noted in the left ovary measuring up to 2.4 cm. No concerning adnexal lesions. Other: No abdominopelvic free fluid or free gas. No bowel containing hernias. Musculoskeletal: Multilevel degenerative changes are present in the imaged portions of the spine. Findings are maximal in the lower lumbar levels L4-S1. No acute osseous abnormality or suspicious osseous lesion. IMPRESSION: 1. Cholelithiasis without CT evidence of acute cholecystitis. Mild periportal edema is nonspecific. 2. Enlarged appearance of the anteverted uterus with lobular heterogeneity at the uterine fundus associated with the endometrial stripe and a small 11 mm hypoattenuating structure centrally there is mild thickening of the endometrial stripe at the lower uterine segment. Findings raise suspicion for early pregnancy versus irregular fibroid uterus. Further evaluation with pelvic ultrasound as clinically indicated. 3. Mild bladder wall thickening though urinary bladder is largely decompressed at the time of exam and therefore poorly evaluated by CT imaging. Correlate with urinalysis to exclude cystitis. These results were called by telephone at the time of interpretation on 07/26/2019 at 7:01 am to Dr. Marjean Donna , who verbally acknowledged these results. Electronically Signed   By: Lovena Le M.D.   On: 07/26/2019 07:02   [place] Sonohysterogram  Result Date: 08/11/2019 Patient Name: Tara Shea DOB: 11-07-1981 MRN: [id number] ULTRASOUND REPORT Location:  [facility] Date of Service: 08/11/2019 Indications:Enlarged Uterus with submucosal mass. SIS performed. Findings: The uterus is anteverted and measures 13.3 x 9.1 x 8.9 cm. Echo texture is heterogenous with evidence of focal masses. Within the uterus are multiple suspected fibroids measuring: Fibroid 1: 70 x 53 x 55 mm greater than 50% submucosal Fibroid 2: 13 x 13 x 16 mm intramural anterior The Endometrium measures 2.6 mm. Right Ovary measures 2.9 x 1.8 x 2.1 cm. It is normal in appearance. Left Ovary is not visualized. Survey of the adnexa demonstrates no adnexal masses. There is no free fluid in the cul de sac. Impression: 1. There is a large submucosal fibroid seen. 2. The endometrial lining appears thin 3. Normal appearing right ovary 4. A Saline Infusion Sonohystegram was performed today highlighting the above findings.  See procedure note. Gweneth Dimitri, RT The ultrasound images and findings were reviewed by me  and I agree with the above report. Prentice Docker, MD, [facility], [facility] 08/11/2019 2:28 PM     US Pelvic Complete With Transvaginal  Result Date: 07/26/2019 CLINICAL DATA:  Vomiting.  Follow-up abnormal CT from 07/26/2019. EXAM: TRANSABDOMINAL AND TRANSVAGINAL ULTRASOUND OF PELVIS TECHNIQUE: Both transabdominal and transvaginal ultrasound examinations of the pelvis were performed. Transabdominal technique was performed for global imaging of the pelvis including uterus, ovaries, adnexal regions, and pelvic cul-de-sac. It was necessary to proceed with endovaginal exam following the transabdominal exam to visualize the uterus and endometrium. COMPARISON:  CT 07/26/2019 FINDINGS: Uterus Measurements: 14.0 by 8.3 by 8.7 cm. = volume: 524 mL. Anteverted. Within the lower uterine segment there is a left anterior myometrial fibroid measuring 1.4 x 1 0.3/2.0 cm. Endometrium Thickness: 47.3 mm. There is a heterogeneous, predominantly solid mass within the endometrium  which measures 5.3 x 3.7 x 4.1 cm. There is increased blood flow within this mass. There are also several small internal cystic areas. Right ovary Not visualized. Left ovary Measurements: 2.9 x 3.4 x 2.3 cm = volume: 12 mL. Normal appearance/no adnexal mass. Other findings Small volume of free fluid noted. IMPRESSION: 1. There is a mass identified within the endometrium with a maximum dimension of 5.3 cm. Primary differential considerations include sub mucosal fibroid, endometrial polyp, or carcinoma. Consider sonohysterogram for further evaluation, prior to hysteroscopy or endometrial biopsy. Electronically Signed   By: Kerby Moors M.D.   On: 07/26/2019 09:18   US Abdomen Limited Ruq  Result Date: 07/26/2019 CLINICAL DATA:  Initial evaluation for acute right upper quadrant pain, nausea, vomiting. EXAM: ULTRASOUND ABDOMEN LIMITED RIGHT UPPER QUADRANT COMPARISON:  Prior CT from 02/15/2007. FINDINGS: Gallbladder: Several scattered shadowing echogenic stones present within the gallbladder lumen, with a dominant stone position at the gallbladder neck measuring 2.7 cm. Gallbladder wall measure within normal limits at 2.3 mm in thickness. No free pericholecystic fluid. No sonographic Murphy sign elicited on exam. Common bile duct: Diameter: 5.6 mm Liver: No focal lesion identified. Within normal limits in parenchymal echogenicity. Portal vein is patent on color Doppler imaging with normal direction of blood flow towards the liver. Other: None. IMPRESSION: 1. Cholelithiasis with dominant 2.7 cm stone positioned at the gallbladder neck. No sonographic evidence for acute cholecystitis. 2. No biliary dilatation. Electronically Signed   By: Jeannine Boga M.D.   On: 07/26/2019 04:19    Assessment:   ICD-10-CM   1. Menorrhagia with irregular cycle  N92.1   2. Intramural and submucous leiomyoma of uterus  D25.1    D25.0   3. Dysmenorrhea  N94.6   4. Factor 5 Leiden mutation, heterozygous ([place])  D68.51   5.  Iron deficiency anemia due to chronic blood loss  D50.0      Plan: Patient will undergo surgical management with the above-noted surgery.   The risks of surgery were discussed in detail with the patient including but not limited to: bleeding which may require transfusion or reoperation; infection which may require antibiotics; injury to surrounding organs which may involve bowel, bladder, ureters ; need for additional procedures including laparoscopy or laparotomy; thromboembolic phenomenon, surgical site problems and other postoperative/anesthesia complications. Likelihood of success in alleviating the patient's condition was discussed. Routine postoperative instructions will be reviewed with the patient and her family in detail after surgery.  The patient concurred with the proposed plan, giving informed written consent for the surgery.  Preoperative prophylactic antibiotics, as indicated, and SCDs ordered on call to the OR.  She also understands that, based on the size of the fibroid, full removal may require more than one procedure. She also understands that a slight delay in pregnancy is recommended given the type of surgery she is undergoing.   Prentice Docker, MD 08/21/2019 4:59 PM

## 2019-08-21 NOTE — Progress Notes (Signed)
Preoperative History and Physical  Tara Shea is a 38 y.o. G0P0000 here for surgical management of menorrhagia and submucosal fibroid.     History of Present Illness: 38 y.o. G0P0000 female who presents for pre-operative evaluation for the above.  During a workup during a recent hospitalization she was noted to have a mass noted in her uterus on CT scan. A pelvic ultrasound noted a heterogeneous, predominantly solid mass within the endometrium, measuring 5.3 cm in size with internal blood flow noted on doppler interrogation.  She states she underwent a myomectomy 9 years ago, but she states that she was told there was one remaining in the myometrium.  In May this year she was diagnosed with another DVT and was started on Xarelto.  Her first DVT was considered provoked. Her most recent DVT was considered unprovoked.  She states that the plan is to have her taken off Xarelto next month in order for her to undergo a more extensive thrombophilia workup, as she has tested positive only for Factor V Leiden heterozygosity in the past.  Her menses are about 7-10 days in length and heavy.  She was changing the largest tampon and pad each half hour.  Her most recent period was not as heavy and she would need to change a pad or tampon (ultra size) every 1.5-2 hours.  She had only been using condoms for birth control.  She received an iron infusion due to heavy menses recently.  Nine years ago she was on warfarin and the same thing happened in terms of heavy bleeding.   She denies unintentional weight loss.  GI symptoms are hard to discern due to recent hospitalization.   She has undergone a saline infusion sonohysterogram that confirmed the fibroid. The measurement of the fibroid at that time was 8 x 5.3 x 5.5 cm with a mostly submucosal location.    She is due to stop taking Xarelto at the end of this month as directed by her hematologist. This should allow a safe surgical situation from a bleeding  standpoint.   Proposed surgery: Hysteroscopy, dilation and curettage, submucosal myomectomy.   Past Medical History:  Diagnosis Date   Asthma    Bone spur    DVT (deep venous thrombosis) (HCC)    Factor 5 Leiden mutation, heterozygous (HCC)    Fibroid    IDA (iron deficiency anemia)    Migraines    PCOS (polycystic ovarian syndrome)    Past Surgical History:  Procedure Laterality Date   CHOLECYSTECTOMY N/A 07/28/2019   Procedure: LAPAROSCOPIC CHOLECYSTECTOMY;  Surgeon: Jules Husbands, MD;  Location: [facility] ORS;  Service: General;  Laterality: N/A;   MYOMECTOMY     TONSILLECTOMY     WISDOM TOOTH EXTRACTION     WRIST SURGERY     age 75 - rebroke it b/c it grew back wrong   OB History  Gravida Para Term Preterm AB Living  0 0 0 0 0 0  SAB TAB Ectopic Multiple Live Births  0 0 0 0 0  Patient denies any other pertinent gynecologic issues.   Current Outpatient Medications on File Prior to Visit  Medication Sig Dispense Refill   albuterol (PROVENTIL HFA;VENTOLIN HFA) 108 (90 Base) MCG/ACT inhaler Inhale 2 puffs into the lungs every 4 (four) hours as needed for wheezing or shortness of breath. 1 Inhaler 2   albuterol (PROVENTIL) (2.5 MG/3ML) 0.083% nebulizer solution Take 3 mLs (2.5 mg total) by nebulization every 6 (six) hours as needed  for wheezing or shortness of breath. 75 mL 12   ferrous sulfate 325 (65 FE) MG tablet Take 325 mg by mouth daily.     fluticasone (FLOVENT HFA) 220 MCG/ACT inhaler Inhale 2 puffs into the lungs 2 (two) times daily. 1 Inhaler 12   Multiple Vitamin (MULTIVITAMIN WITH MINERALS) TABS tablet Take 1 tablet by mouth daily.     nystatin (MYCOSTATIN) 100000 UNIT/ML suspension Take 5 mLs (500,000 Units total) by mouth 4 (four) times daily. (Patient not taking: Reported on 08/09/2019) 60 mL 0   ondansetron (ZOFRAN ODT) 4 MG disintegrating tablet Take 1 tablet (4 mg total) by mouth every 6 (six) hours as needed for nausea. 20 tablet 0    oxyCODONE-acetaminophen (PERCOCET/ROXICET) 5-325 MG tablet Take 1-2 tablets by mouth every 4 (four) hours as needed for moderate pain. 15 tablet 0   promethazine (PHENERGAN) 25 MG tablet Take 1 tablet (25 mg total) by mouth every 8 (eight) hours as needed for nausea or vomiting. 30 tablet 0   rivaroxaban (XARELTO) 20 MG TABS tablet Take 1 tablet (20 mg total) by mouth daily with supper. 30 tablet 3   Spacer/Aero-Holding Chambers (AEROCHAMBER PLUS) inhaler Use as instructed 1 each 2   vitamin B-12 (CYANOCOBALAMIN) 1000 MCG tablet Take 1 tablet (1,000 mcg total) by mouth daily. 30 tablet 0   vitamin C (ASCORBIC ACID) 500 MG tablet Take 500 mg by mouth daily.     No current facility-administered medications on file prior to visit.    No Known Allergies  Social History:   reports that she has never smoked. She has never used smokeless tobacco. She reports current alcohol use. She reports that she does not use drugs.  Family History  Problem Relation Age of Onset   Heart murmur Mother    Thyroid disease Mother    Migraines Mother    Asthma Mother    Diabetes Mother        TYPE 2   Bipolar disorder Mother    Breast cancer Maternal Aunt 45   Cancer Maternal Uncle        skin   Cancer Maternal Grandmother 92       colon   Hypertension Maternal Grandmother    Cancer Maternal Aunt 50       colon   Diabetes Maternal Aunt     Review of Systems: Noncontributory  PHYSICAL EXAM: Blood pressure 128/88, height 5\' 8"  (1.727 m), weight (!) 320 lb (145.2 kg). CONSTITUTIONAL: Well-developed, well-nourished female in no acute distress.  HENT:  Normocephalic, atraumatic, External right and left ear normal. Oropharynx is clear and moist EYES: Conjunctivae and EOM are normal. Pupils are equal, round, and reactive to light. No scleral icterus.  NECK: Normal range of motion, supple, no masses SKIN: Skin is warm and dry. No rash noted. Not diaphoretic. No erythema. No  pallor. [place]: Alert and oriented to person, place, and time. Normal reflexes, muscle tone coordination. No cranial nerve deficit noted. PSYCHIATRIC: Normal mood and affect. Normal behavior. Normal judgment and thought content. CARDIOVASCULAR: Normal heart rate noted, regular rhythm RESPIRATORY: Effort and breath sounds normal, no problems with respiration noted ABDOMEN: Soft, nontender, nondistended. PELVIC: Deferred MUSCULOSKELETAL: Normal range of motion. No edema and no tenderness. 2+ distal pulses.  Labs: No results found for this or any previous visit (from the past 336 hour(s)).  Imaging Studies: Ct Abdomen Pelvis W Contrast  Result Date: 07/26/2019 CLINICAL DATA:  Abdominal pain, acute generalized EXAM: CT ABDOMEN AND PELVIS WITH CONTRAST  TECHNIQUE: Multidetector CT imaging of the abdomen and pelvis was performed using the standard protocol following bolus administration of intravenous contrast. CONTRAST:  136mL OMNIPAQUE IOHEXOL 300 MG/ML  SOLN COMPARISON:  CT 02/15/2007 FINDINGS: Lower chest: Basilar areas of atelectasis. Normal heart size. No pericardial effusion. Hepatobiliary: No focal hepatic abnormality. Mild periportal edema. Few calcified gallstones within the body of the gallbladder. No pericholecystic inflammation. No gallbladder wall thickening. No biliary ductal dilatation or calcified intraductal gallstones. Pancreas: Unremarkable. No pancreatic ductal dilatation or surrounding inflammatory changes. Spleen: Normal in size without focal abnormality. Adrenals/Urinary Tract: Adrenal glands are unremarkable. Kidneys are normal, without renal calculi, focal lesion, or hydronephrosis. Mild bladder wall thickening though urinary bladder is largely decompressed at the time of exam and therefore poorly evaluated by CT imaging. Stomach/Bowel: Distal esophagus, stomach and duodenal sweep are unremarkable. No bowel wall thickening or dilatation. No evidence of obstruction. A normal  appendix is visualized. Vascular/Lymphatic: Normal caliber aorta. No aneurysm or ectasia. No suspicious or enlarged lymph nodes in the included lymphatic chains. Reproductive: There is an enlarged appearance of the anteverted uterus with lobular heterogeneity at the uterine fundus associated with the endometrial stripe and a small hypoattenuating 11 mm structure seen centrally there is mild thickening of the endometrial stripe at the lower uterine segment. Few dominant follicles are noted in the left ovary measuring up to 2.4 cm. No concerning adnexal lesions. Other: No abdominopelvic free fluid or free gas. No bowel containing hernias. Musculoskeletal: Multilevel degenerative changes are present in the imaged portions of the spine. Findings are maximal in the lower lumbar levels L4-S1. No acute osseous abnormality or suspicious osseous lesion. IMPRESSION: 1. Cholelithiasis without CT evidence of acute cholecystitis. Mild periportal edema is nonspecific. 2. Enlarged appearance of the anteverted uterus with lobular heterogeneity at the uterine fundus associated with the endometrial stripe and a small 11 mm hypoattenuating structure centrally there is mild thickening of the endometrial stripe at the lower uterine segment. Findings raise suspicion for early pregnancy versus irregular fibroid uterus. Further evaluation with pelvic ultrasound as clinically indicated. 3. Mild bladder wall thickening though urinary bladder is largely decompressed at the time of exam and therefore poorly evaluated by CT imaging. Correlate with urinalysis to exclude cystitis. These results were called by telephone at the time of interpretation on 07/26/2019 at 7:01 am to Dr. Marjean Donna , who verbally acknowledged these results. Electronically Signed   By: Lovena Le M.D.   On: 07/26/2019 07:02   [place] Sonohysterogram  Result Date: 08/11/2019 Patient Name: LUELLEN PORTEOUS DOB: [birth year] MRN: [id number] ULTRASOUND REPORT Location:  [facility] Date of Service: 08/11/2019 Indications:Enlarged Uterus with submucosal mass. SIS performed. Findings: The uterus is anteverted and measures 13.3 x 9.1 x 8.9 cm. Echo texture is heterogenous with evidence of focal masses. Within the uterus are multiple suspected fibroids measuring: Fibroid 1: 70 x 53 x 55 mm greater than 50% submucosal Fibroid 2: 13 x 13 x 16 mm intramural anterior The Endometrium measures 2.6 mm. Right Ovary measures 2.9 x 1.8 x 2.1 cm. It is normal in appearance. Left Ovary is not visualized. Survey of the adnexa demonstrates no adnexal masses. There is no free fluid in the cul de sac. Impression: 1. There is a large submucosal fibroid seen. 2. The endometrial lining appears thin 3. Normal appearing right ovary 4. A Saline Infusion Sonohystegram was performed today highlighting the above findings.  See procedure note. Gweneth Dimitri, RT The ultrasound images and findings were reviewed by me  and I agree with the above report. Prentice Docker, MD, [facility], [facility] 08/11/2019 2:28 PM     US Pelvic Complete With Transvaginal  Result Date: 07/26/2019 CLINICAL DATA:  Vomiting.  Follow-up abnormal CT from 07/26/2019. EXAM: TRANSABDOMINAL AND TRANSVAGINAL ULTRASOUND OF PELVIS TECHNIQUE: Both transabdominal and transvaginal ultrasound examinations of the pelvis were performed. Transabdominal technique was performed for global imaging of the pelvis including uterus, ovaries, adnexal regions, and pelvic cul-de-sac. It was necessary to proceed with endovaginal exam following the transabdominal exam to visualize the uterus and endometrium. COMPARISON:  CT 07/26/2019 FINDINGS: Uterus Measurements: 14.0 by 8.3 by 8.7 cm. = volume: 524 mL. Anteverted. Within the lower uterine segment there is a left anterior myometrial fibroid measuring 1.4 x 1 0.3/2.0 cm. Endometrium Thickness: 47.3 mm. There is a heterogeneous, predominantly solid mass within the endometrium  which measures 5.3 x 3.7 x 4.1 cm. There is increased blood flow within this mass. There are also several small internal cystic areas. Right ovary Not visualized. Left ovary Measurements: 2.9 x 3.4 x 2.3 cm = volume: 12 mL. Normal appearance/no adnexal mass. Other findings Small volume of free fluid noted. IMPRESSION: 1. There is a mass identified within the endometrium with a maximum dimension of 5.3 cm. Primary differential considerations include sub mucosal fibroid, endometrial polyp, or carcinoma. Consider sonohysterogram for further evaluation, prior to hysteroscopy or endometrial biopsy. Electronically Signed   By: Kerby Moors M.D.   On: 07/26/2019 09:18   US Abdomen Limited Ruq  Result Date: 07/26/2019 CLINICAL DATA:  Initial evaluation for acute right upper quadrant pain, nausea, vomiting. EXAM: ULTRASOUND ABDOMEN LIMITED RIGHT UPPER QUADRANT COMPARISON:  Prior CT from 02/15/2007. FINDINGS: Gallbladder: Several scattered shadowing echogenic stones present within the gallbladder lumen, with a dominant stone position at the gallbladder neck measuring 2.7 cm. Gallbladder wall measure within normal limits at 2.3 mm in thickness. No free pericholecystic fluid. No sonographic Murphy sign elicited on exam. Common bile duct: Diameter: 5.6 mm Liver: No focal lesion identified. Within normal limits in parenchymal echogenicity. Portal vein is patent on color Doppler imaging with normal direction of blood flow towards the liver. Other: None. IMPRESSION: 1. Cholelithiasis with dominant 2.7 cm stone positioned at the gallbladder neck. No sonographic evidence for acute cholecystitis. 2. No biliary dilatation. Electronically Signed   By: Jeannine Boga M.D.   On: 07/26/2019 04:19    Assessment:   ICD-10-CM   1. Menorrhagia with irregular cycle  N92.1   2. Intramural and submucous leiomyoma of uterus  D25.1    D25.0   3. Dysmenorrhea  N94.6   4. Factor 5 Leiden mutation, heterozygous ([place])  D68.51   5.  Iron deficiency anemia due to chronic blood loss  D50.0      Plan: Patient will undergo surgical management with the above-noted surgery.   The risks of surgery were discussed in detail with the patient including but not limited to: bleeding which may require transfusion or reoperation; infection which may require antibiotics; injury to surrounding organs which may involve bowel, bladder, ureters ; need for additional procedures including laparoscopy or laparotomy; thromboembolic phenomenon, surgical site problems and other postoperative/anesthesia complications. Likelihood of success in alleviating the patient's condition was discussed. Routine postoperative instructions will be reviewed with the patient and her family in detail after surgery.  The patient concurred with the proposed plan, giving informed written consent for the surgery.  Preoperative prophylactic antibiotics, as indicated, and SCDs ordered on call to the OR.  She also understands that, based on the size of the fibroid, full removal may require more than one procedure. She also understands that a slight delay in pregnancy is recommended given the type of surgery she is undergoing.   Prentice Docker, MD 08/21/2019 4:59 PM

## 2019-08-22 ENCOUNTER — Other Ambulatory Visit: Payer: Self-pay

## 2019-08-23 ENCOUNTER — Encounter: Payer: Self-pay | Admitting: Internal Medicine

## 2019-08-23 ENCOUNTER — Inpatient Hospital Stay: Payer: 59

## 2019-08-23 ENCOUNTER — Other Ambulatory Visit: Payer: Self-pay

## 2019-08-23 ENCOUNTER — Other Ambulatory Visit: Payer: Self-pay | Admitting: *Deleted

## 2019-08-23 VITALS — BP 113/83 | HR 76 | Temp 97.6°F | Resp 18

## 2019-08-23 DIAGNOSIS — D500 Iron deficiency anemia secondary to blood loss (chronic): Secondary | ICD-10-CM

## 2019-08-23 DIAGNOSIS — E538 Deficiency of other specified B group vitamins: Secondary | ICD-10-CM

## 2019-08-23 DIAGNOSIS — I82461 Acute embolism and thrombosis of right calf muscular vein: Secondary | ICD-10-CM

## 2019-08-23 DIAGNOSIS — D508 Other iron deficiency anemias: Secondary | ICD-10-CM | POA: Diagnosis not present

## 2019-08-23 LAB — CBC WITH DIFFERENTIAL/PLATELET
Abs Immature Granulocytes: 0.01 10*3/uL (ref 0.00–0.07)
Basophils Absolute: 0 10*3/uL (ref 0.0–0.1)
Basophils Relative: 0 %
Eosinophils Absolute: 0.2 10*3/uL (ref 0.0–0.5)
Eosinophils Relative: 3 %
HCT: 36.4 % (ref 36.0–46.0)
Hemoglobin: 11.6 g/dL — ABNORMAL LOW (ref 12.0–15.0)
Immature Granulocytes: 0 %
Lymphocytes Relative: 38 %
Lymphs Abs: 2 10*3/uL (ref 0.7–4.0)
MCH: 26.9 pg (ref 26.0–34.0)
MCHC: 31.9 g/dL (ref 30.0–36.0)
MCV: 84.3 fL (ref 80.0–100.0)
Monocytes Absolute: 0.3 10*3/uL (ref 0.1–1.0)
Monocytes Relative: 6 %
Neutro Abs: 2.7 10*3/uL (ref 1.7–7.7)
Neutrophils Relative %: 53 %
Platelets: 260 10*3/uL (ref 150–400)
RBC: 4.32 MIL/uL (ref 3.87–5.11)
RDW: 18 % — ABNORMAL HIGH (ref 11.5–15.5)
WBC: 5.2 10*3/uL (ref 4.0–10.5)
nRBC: 0 % (ref 0.0–0.2)

## 2019-08-23 LAB — IRON AND TIBC
Iron: 51 ug/dL (ref 28–170)
Saturation Ratios: 14 % (ref 10.4–31.8)
TIBC: 363 ug/dL (ref 250–450)
UIBC: 312 ug/dL

## 2019-08-23 LAB — FERRITIN: Ferritin: 80 ng/mL (ref 11–307)

## 2019-08-23 LAB — VITAMIN B12: Vitamin B-12: 390 pg/mL (ref 180–914)

## 2019-08-23 MED ORDER — CYANOCOBALAMIN 1000 MCG/ML IJ SOLN
1000.0000 ug | Freq: Once | INTRAMUSCULAR | Status: AC
  Administered 2019-08-23: 1000 ug via INTRAMUSCULAR
  Filled 2019-08-23: qty 1

## 2019-08-23 MED ORDER — IRON SUCROSE 20 MG/ML IV SOLN
200.0000 mg | Freq: Once | INTRAVENOUS | Status: AC
  Administered 2019-08-23: 200 mg via INTRAVENOUS
  Filled 2019-08-23: qty 10

## 2019-08-23 MED ORDER — SODIUM CHLORIDE 0.9 % IV SOLN
Freq: Once | INTRAVENOUS | Status: AC
  Administered 2019-08-23: 14:00:00 via INTRAVENOUS
  Filled 2019-08-23: qty 250

## 2019-08-25 ENCOUNTER — Other Ambulatory Visit: Payer: Self-pay

## 2019-08-25 ENCOUNTER — Encounter
Admission: RE | Admit: 2019-08-25 | Discharge: 2019-08-25 | Disposition: A | Discharge status: Home / Self Care | Payer: 59 | Source: Ambulatory Visit | Attending: Obstetrics and Gynecology | Admitting: Obstetrics and Gynecology

## 2019-08-25 HISTORY — DX: Personal history of urinary calculi: Z87.442

## 2019-08-25 NOTE — Patient Instructions (Addendum)
INSTRUCTIONS FOR SURGERY     Your surgery is scheduled for:   Thursday, October 8th     To find out your arrival time for the day of surgery,          please call [phone] between 1 pm and 3 pm on :  Wednesday, October 7th     When you arrive for surgery, report to the [facility].       Do NOT stop on the first floor to register.    REMEMBER: Instructions that are not followed completely may result in serious medical risk,  up to and including death, or upon the discretion of your surgeon and anesthesiologist,            your surgery may need to be rescheduled.  __X__ 1. Do not eat food after midnight the night before your procedure.                    No gum, candy, lozenger, tic tacs, tums or hard candies.                  ABSOLUTELY NOTHING SOLID IN YOUR MOUTH AFTER MIDNIGHT                    You may drink unlimited clear liquids up to 2 hours before you are scheduled to arrive for surgery.                   Do not drink anything within those 2 hours unless you need to take medicine, then take the                   smallest amount you need.  Clear liquids include:  water, apple juice without pulp,                   any flavor Gatorade, Black coffee, black tea.  Sugar may be added but no dairy/ honey /lemon.                        Broth and jello is not considered a clear liquid.  __x__  2. On the morning of surgery, please brush your teeth with toothpaste and water. You may rinse with                  mouthwash if you wish but DO NOT SWALLOW TOOTHPASTE OR MOUTHWASH  __X___3. NO alcohol for 24 hours before or after surgery.  __x___ 4.  Do NOT smoke or use e-cigarettes for 24 HOURS PRIOR TO SURGERY.                      DO NOT Use any chewable tobacco products for at least 6 hours prior to surgery.  __x___ 5. If you start any new medication after this appointment and prior to surgery, please          Bring it with you on the day of surgery.  ___x__ 6. Notify your doctor if there is any change in your medical condition, such as fever, infection, vomitting,  Diarrhea or any open sores.  __x___ 7.  USE antibacterial SOAP as instructed, the night before surgery and the day of surgery.                   Once you have washed with this soap, do NOT use any of the following: Powders, perfumes                    or lotions. Please do not wear make up, hairpins, clips or nail polish. You  wear deodorant.                   Men may shave their face and neck.  Women need to shave 48 hours prior to surgery.                   DO NOT wear ANY jewelry on the day of surgery. If there are rings that are too tight to                    remove easily, please address this prior to the surgery day. Piercings need to be removed.                                                                     NO METAL ON YOUR BODY.                    Do NOT bring any valuables.  If you came to Pre-Admit testing then you will not need license,                     insurance card or credit card.  If you will be staying overnight, please either leave your things in                     the car or have your family be responsible for these items.                     [facility] IS NOT RESPONSIBLE FOR BELONGINGS OR VALUABLES.  ___X__ 8. DO NOT wear contact lenses on surgery day.  You may not have dentures,                     Hearing aides, contacts or glasses in the operating room. These items can be                    Placed in the Recovery Room to receive immediately after surgery.  __x___ 9. IF YOU ARE SCHEDULED TO GO HOME ON THE SAME DAY, YOU MUST                   Have someone to drive you home and to stay with you  for the first 24 hours.                    Have an arrangement prior to arriving on surgery day.  ___x__ 10. Take the following medications on the morning of surgery with a sip of water:  1.  FLOVENT INHALER, please bring with you on day of surgery                     2.                     3.                     4.                     5.                     6.  _____ 11.  Follow any instructions provided to you by your surgeon.                        Such as enema, clear liquid bowel prep  __X__  12. STOP  ASPIRIN AS OF:  TODAY, October 2ND                       THIS INCLUDES BC POWDERS / GOODIES POWDER  __x___ 13. STOP Anti-inflammatories as of:  TODAY, October 2ND                      This includes IBUPROFEN / MOTRIN / ADVIL / ALEVE/ NAPROXYN                    YOU MAY TAKE TYLENOL ANY TIME PRIOR TO SURGERY.  _____ 14.   You may continue taking                         VITAMIN B12 // FERROUS SULFATE // MULTIVITAMINS // VITAMIN C                      but do not take on the morning of surgery.    COVID TESTING ON Monday, October 5TH.  Dr. Glennon Mac has ordered you to drink the carb drink in your bag prior to surgery.   Please have it completed 3 hours prior to your surgery start time.   This has been proven to help hydrate patients prior to a surgery.

## 2019-08-28 ENCOUNTER — Telehealth: Payer: Self-pay | Admitting: Internal Medicine

## 2019-08-28 ENCOUNTER — Other Ambulatory Visit
Admission: RE | Admit: 2019-08-28 | Discharge: 2019-08-28 | Disposition: A | Discharge status: Home / Self Care | Payer: 59 | Source: Ambulatory Visit | Attending: Obstetrics and Gynecology | Admitting: Obstetrics and Gynecology

## 2019-08-28 ENCOUNTER — Other Ambulatory Visit: Payer: Self-pay

## 2019-08-28 DIAGNOSIS — N921 Excessive and frequent menstruation with irregular cycle: Secondary | ICD-10-CM | POA: Diagnosis not present

## 2019-08-28 DIAGNOSIS — Z20828 Contact with and (suspected) exposure to other viral communicable diseases: Secondary | ICD-10-CM | POA: Diagnosis not present

## 2019-08-28 DIAGNOSIS — Z01812 Encounter for preprocedural laboratory examination: Secondary | ICD-10-CM | POA: Diagnosis not present

## 2019-08-28 DIAGNOSIS — D250 Submucous leiomyoma of uterus: Secondary | ICD-10-CM | POA: Diagnosis not present

## 2019-08-28 DIAGNOSIS — N858 Other specified noninflammatory disorders of uterus: Secondary | ICD-10-CM | POA: Diagnosis not present

## 2019-08-28 DIAGNOSIS — D251 Intramural leiomyoma of uterus: Secondary | ICD-10-CM | POA: Insufficient documentation

## 2019-08-28 DIAGNOSIS — Z86718 Personal history of other venous thrombosis and embolism: Secondary | ICD-10-CM | POA: Insufficient documentation

## 2019-08-28 DIAGNOSIS — I82461 Acute embolism and thrombosis of right calf muscular vein: Secondary | ICD-10-CM

## 2019-08-28 LAB — TYPE AND SCREEN
ABO/RH(D): A NEG
Antibody Screen: NEGATIVE

## 2019-08-28 LAB — SARS CORONAVIRUS 2 BY RT PCR (HOSPITAL ORDER, PERFORMED IN ~~LOC~~ HOSPITAL LAB): SARS Coronavirus 2: NEGATIVE

## 2019-08-28 NOTE — Telephone Encounter (Signed)
Discussed regarding upcoming myomectomy with Dr. Donnamarie Poag prophylactic-therapeutic anticoagulation.  Will discuss with Dr. Eulis Canner of prophylactic anticoagulation/strength-Xarelto 10 versus 20.   Spoke to patient regarding timing of the labs/scheduled appointment with me.   C: Please keep the lab appointment for the patient as planned on 28 October; however pus the appointment with me/infusion-B12-venofer 11th thru 13th of November.

## 2019-08-28 NOTE — Telephone Encounter (Signed)
Spoke to Dr.Jackson-regarding the pros and cons of postoperative DVT prophylaxis.  Given the large fibroid-risk of bleeding post surgery; and patient should be able to ambulate well post surgery.  Could consider Xarelto 10 mg prophylaxis for 1 week.  However will decide on instituting prophylactic Xarelto based upon patient's postoperative bleeding/after discussion with Dr. Glennon Mac.  I will inform patient.

## 2019-08-28 NOTE — Telephone Encounter (Signed)
Colette, pls arrange for iv venofer on 11/11, 11/12 and 11/13

## 2019-08-29 ENCOUNTER — Telehealth: Payer: Self-pay | Admitting: Internal Medicine

## 2019-08-29 MED ORDER — RIVAROXABAN 10 MG PO TABS: 10.0000 mg | ORAL_TABLET | Freq: Every day | ORAL | 0 refills | Status: DC

## 2019-08-29 NOTE — Telephone Encounter (Signed)
I spoke to pt re: my discussion with Dr.Jackson- that given her planned large uterine fibroid resection, pt is at higher risk of bleeding than risk of DVT [given her prior recent DVT ~4 months ago].    POST SURGERY IF NO SIGNIFICANT UTERINE BLEEDING IS NOTED I would recommend xarelto 10 mg prophylaxis; ONLY AFTER  discussion with Dr.Jackson. will send script for xarelto 10 mg only to be started after ok with MD only.

## 2019-08-30 ENCOUNTER — Other Ambulatory Visit: Payer: 59

## 2019-08-31 ENCOUNTER — Encounter
Admission: RE | Disposition: A | Discharge status: Home / Self Care | Payer: Self-pay | Source: Home / Self Care | Attending: Obstetrics and Gynecology

## 2019-08-31 ENCOUNTER — Ambulatory Visit
Admission: RE | Admit: 2019-08-31 | Discharge: 2019-08-31 | Disposition: A | Discharge status: Home / Self Care | Payer: 59 | Attending: Obstetrics and Gynecology | Admitting: Obstetrics and Gynecology

## 2019-08-31 ENCOUNTER — Ambulatory Visit: Payer: 59 | Admitting: Anesthesiology

## 2019-08-31 ENCOUNTER — Encounter: Payer: Self-pay | Admitting: *Deleted

## 2019-08-31 ENCOUNTER — Other Ambulatory Visit: Payer: Self-pay

## 2019-08-31 DIAGNOSIS — Z8249 Family history of ischemic heart disease and other diseases of the circulatory system: Secondary | ICD-10-CM | POA: Diagnosis not present

## 2019-08-31 DIAGNOSIS — D251 Intramural leiomyoma of uterus: Secondary | ICD-10-CM | POA: Diagnosis not present

## 2019-08-31 DIAGNOSIS — Z803 Family history of malignant neoplasm of breast: Secondary | ICD-10-CM | POA: Diagnosis not present

## 2019-08-31 DIAGNOSIS — R9389 Abnormal findings on diagnostic imaging of other specified body structures: Secondary | ICD-10-CM | POA: Insufficient documentation

## 2019-08-31 DIAGNOSIS — D250 Submucous leiomyoma of uterus: Secondary | ICD-10-CM | POA: Diagnosis present

## 2019-08-31 DIAGNOSIS — Z833 Family history of diabetes mellitus: Secondary | ICD-10-CM | POA: Diagnosis not present

## 2019-08-31 DIAGNOSIS — N800 Endometriosis of uterus: Secondary | ICD-10-CM | POA: Insufficient documentation

## 2019-08-31 DIAGNOSIS — Z9049 Acquired absence of other specified parts of digestive tract: Secondary | ICD-10-CM | POA: Diagnosis not present

## 2019-08-31 DIAGNOSIS — J45909 Unspecified asthma, uncomplicated: Secondary | ICD-10-CM | POA: Diagnosis not present

## 2019-08-31 DIAGNOSIS — Z79899 Other long term (current) drug therapy: Secondary | ICD-10-CM | POA: Diagnosis not present

## 2019-08-31 DIAGNOSIS — N921 Excessive and frequent menstruation with irregular cycle: Secondary | ICD-10-CM | POA: Diagnosis present

## 2019-08-31 DIAGNOSIS — Z8349 Family history of other endocrine, nutritional and metabolic diseases: Secondary | ICD-10-CM | POA: Insufficient documentation

## 2019-08-31 DIAGNOSIS — Z825 Family history of asthma and other chronic lower respiratory diseases: Secondary | ICD-10-CM | POA: Insufficient documentation

## 2019-08-31 DIAGNOSIS — Z7901 Long term (current) use of anticoagulants: Secondary | ICD-10-CM | POA: Diagnosis not present

## 2019-08-31 DIAGNOSIS — N858 Other specified noninflammatory disorders of uterus: Secondary | ICD-10-CM | POA: Diagnosis not present

## 2019-08-31 DIAGNOSIS — Z818 Family history of other mental and behavioral disorders: Secondary | ICD-10-CM | POA: Insufficient documentation

## 2019-08-31 DIAGNOSIS — Z7951 Long term (current) use of inhaled steroids: Secondary | ICD-10-CM | POA: Insufficient documentation

## 2019-08-31 DIAGNOSIS — D500 Iron deficiency anemia secondary to blood loss (chronic): Secondary | ICD-10-CM | POA: Diagnosis not present

## 2019-08-31 DIAGNOSIS — D6851 Activated protein C resistance: Secondary | ICD-10-CM | POA: Diagnosis not present

## 2019-08-31 DIAGNOSIS — Z86718 Personal history of other venous thrombosis and embolism: Secondary | ICD-10-CM | POA: Diagnosis not present

## 2019-08-31 DIAGNOSIS — E282 Polycystic ovarian syndrome: Secondary | ICD-10-CM | POA: Insufficient documentation

## 2019-08-31 DIAGNOSIS — Z87442 Personal history of urinary calculi: Secondary | ICD-10-CM | POA: Insufficient documentation

## 2019-08-31 DIAGNOSIS — J4520 Mild intermittent asthma, uncomplicated: Secondary | ICD-10-CM | POA: Diagnosis not present

## 2019-08-31 DIAGNOSIS — Z800 Family history of malignant neoplasm of digestive organs: Secondary | ICD-10-CM | POA: Diagnosis not present

## 2019-08-31 DIAGNOSIS — Z808 Family history of malignant neoplasm of other organs or systems: Secondary | ICD-10-CM | POA: Diagnosis not present

## 2019-08-31 DIAGNOSIS — G43909 Migraine, unspecified, not intractable, without status migrainosus: Secondary | ICD-10-CM | POA: Diagnosis not present

## 2019-08-31 DIAGNOSIS — N920 Excessive and frequent menstruation with regular cycle: Secondary | ICD-10-CM | POA: Diagnosis not present

## 2019-08-31 DIAGNOSIS — N946 Dysmenorrhea, unspecified: Secondary | ICD-10-CM | POA: Diagnosis present

## 2019-08-31 HISTORY — PX: HYSTEROSCOPY WITH D & C: SHX1775

## 2019-08-31 LAB — ABO/RH: ABO/RH(D): A NEG

## 2019-08-31 LAB — POCT PREGNANCY, URINE: Preg Test, Ur: NEGATIVE

## 2019-08-31 SURGERY — DILATATION AND CURETTAGE /HYSTEROSCOPY
Anesthesia: General

## 2019-08-31 MED ORDER — KETOROLAC TROMETHAMINE 30 MG/ML IJ SOLN
INTRAMUSCULAR | Status: DC | PRN
  Administered 2019-08-31: 30 mg via INTRAVENOUS

## 2019-08-31 MED ORDER — HYDROCODONE-ACETAMINOPHEN 5-325 MG PO TABS: 1.0000 | ORAL_TABLET | Freq: Three times a day (TID) | ORAL | 0 refills | Status: DC | PRN

## 2019-08-31 MED ORDER — VASOPRESSIN 20 UNIT/ML IV SOLN
INTRAVENOUS | Status: DC | PRN
  Administered 2019-08-31: 14:00:00 5 mL via INTRAMUSCULAR

## 2019-08-31 MED ORDER — PROPOFOL 10 MG/ML IV BOLUS
INTRAVENOUS | Status: DC | PRN
  Administered 2019-08-31: 200 mg via INTRAVENOUS
  Administered 2019-08-31: 30 mg via INTRAVENOUS
  Administered 2019-08-31: 50 mg via INTRAVENOUS

## 2019-08-31 MED ORDER — ACETAMINOPHEN 160 MG/5ML PO SOLN
325.0000 mg | ORAL | Status: DC | PRN
  Filled 2019-08-31: qty 20.3

## 2019-08-31 MED ORDER — LACTATED RINGERS IV SOLN
INTRAVENOUS | Status: DC
  Administered 2019-08-31: 09:00:00 via INTRAVENOUS

## 2019-08-31 MED ORDER — PROMETHAZINE HCL 25 MG/ML IJ SOLN
INTRAMUSCULAR | Status: AC
  Administered 2019-08-31: 12.5 mg via INTRAVENOUS
  Filled 2019-08-31: qty 1

## 2019-08-31 MED ORDER — FAMOTIDINE 20 MG PO TABS
20.0000 mg | ORAL_TABLET | Freq: Once | ORAL | Status: AC
  Administered 2019-08-31: 20 mg via ORAL

## 2019-08-31 MED ORDER — FENTANYL CITRATE (PF) 100 MCG/2ML IJ SOLN
25.0000 ug | INTRAMUSCULAR | Status: DC | PRN
  Administered 2019-08-31: 15:00:00 50 ug via INTRAVENOUS

## 2019-08-31 MED ORDER — LACTATED RINGERS IV SOLN: INTRAVENOUS | Status: DC

## 2019-08-31 MED ORDER — HYDROCODONE-ACETAMINOPHEN 7.5-325 MG PO TABS
1.0000 | ORAL_TABLET | Freq: Once | ORAL | Status: DC | PRN
  Filled 2019-08-31: qty 1

## 2019-08-31 MED ORDER — DEXAMETHASONE SODIUM PHOSPHATE 10 MG/ML IJ SOLN
INTRAMUSCULAR | Status: DC | PRN
  Administered 2019-08-31: 10 mg via INTRAVENOUS

## 2019-08-31 MED ORDER — FAMOTIDINE 20 MG PO TABS
ORAL_TABLET | ORAL | Status: AC
  Filled 2019-08-31: qty 1

## 2019-08-31 MED ORDER — SODIUM CHLORIDE (PF) 0.9 % IJ SOLN
INTRAMUSCULAR | Status: AC
  Filled 2019-08-31: qty 50

## 2019-08-31 MED ORDER — MIDAZOLAM HCL 2 MG/2ML IJ SOLN
INTRAMUSCULAR | Status: DC | PRN
  Administered 2019-08-31: 2 mg via INTRAVENOUS

## 2019-08-31 MED ORDER — PROPOFOL 10 MG/ML IV BOLUS
INTRAVENOUS | Status: AC
  Filled 2019-08-31: qty 20

## 2019-08-31 MED ORDER — MEPERIDINE HCL 50 MG/ML IJ SOLN: 6.2500 mg | INTRAMUSCULAR | Status: DC | PRN

## 2019-08-31 MED ORDER — SUCCINYLCHOLINE CHLORIDE 20 MG/ML IJ SOLN
INTRAMUSCULAR | Status: DC | PRN
  Administered 2019-08-31: 140 mg via INTRAVENOUS

## 2019-08-31 MED ORDER — HYDROMORPHONE HCL 1 MG/ML IJ SOLN
0.5000 mg | INTRAMUSCULAR | Status: DC | PRN
  Administered 2019-08-31: 15:00:00 0.5 mg via INTRAVENOUS

## 2019-08-31 MED ORDER — LIDOCAINE-EPINEPHRINE 1 %-1:100000 IJ SOLN
INTRAMUSCULAR | Status: AC
  Filled 2019-08-31: qty 1

## 2019-08-31 MED ORDER — ACETAMINOPHEN 325 MG PO TABS: 325.0000 mg | ORAL_TABLET | ORAL | Status: DC | PRN

## 2019-08-31 MED ORDER — FENTANYL CITRATE (PF) 100 MCG/2ML IJ SOLN
INTRAMUSCULAR | Status: AC
  Administered 2019-08-31: 50 ug via INTRAVENOUS
  Filled 2019-08-31: qty 2

## 2019-08-31 MED ORDER — FENTANYL CITRATE (PF) 100 MCG/2ML IJ SOLN
INTRAMUSCULAR | Status: AC
  Filled 2019-08-31: qty 2

## 2019-08-31 MED ORDER — HYDROMORPHONE HCL 1 MG/ML IJ SOLN
INTRAMUSCULAR | Status: AC
  Administered 2019-08-31: 0.5 mg via INTRAVENOUS
  Filled 2019-08-31: qty 1

## 2019-08-31 MED ORDER — DEXMEDETOMIDINE HCL 200 MCG/2ML IV SOLN
INTRAVENOUS | Status: DC | PRN
  Administered 2019-08-31: 8 ug via INTRAVENOUS
  Administered 2019-08-31: 4 ug via INTRAVENOUS

## 2019-08-31 MED ORDER — SODIUM CHLORIDE FLUSH 0.9 % IV SOLN
INTRAVENOUS | Status: AC
  Filled 2019-08-31: qty 10

## 2019-08-31 MED ORDER — MIDAZOLAM HCL 2 MG/2ML IJ SOLN
INTRAMUSCULAR | Status: AC
  Filled 2019-08-31: qty 2

## 2019-08-31 MED ORDER — IBUPROFEN 600 MG PO TABS: 600.0000 mg | ORAL_TABLET | Freq: Four times a day (QID) | ORAL | 0 refills | Status: DC | PRN

## 2019-08-31 MED ORDER — SILVER NITRATE-POT NITRATE 75-25 % EX MISC
CUTANEOUS | Status: DC | PRN
  Administered 2019-08-31: 4

## 2019-08-31 MED ORDER — LIDOCAINE HCL (CARDIAC) PF 100 MG/5ML IV SOSY
PREFILLED_SYRINGE | INTRAVENOUS | Status: DC | PRN
  Administered 2019-08-31: 100 mg via INTRAVENOUS

## 2019-08-31 MED ORDER — ONDANSETRON HCL 4 MG/2ML IJ SOLN
INTRAMUSCULAR | Status: DC | PRN
  Administered 2019-08-31: 4 mg via INTRAVENOUS

## 2019-08-31 MED ORDER — FENTANYL CITRATE (PF) 100 MCG/2ML IJ SOLN
INTRAMUSCULAR | Status: DC | PRN
  Administered 2019-08-31 (×2): 50 ug via INTRAVENOUS
  Administered 2019-08-31: 100 ug via INTRAVENOUS

## 2019-08-31 MED ORDER — PROMETHAZINE HCL 25 MG/ML IJ SOLN
6.2500 mg | INTRAMUSCULAR | Status: DC | PRN
  Administered 2019-08-31: 15:00:00 12.5 mg via INTRAVENOUS

## 2019-08-31 SURGICAL SUPPLY — 27 items
BAG URINE DRAINAGE (UROLOGICAL SUPPLIES) IMPLANT
CANISTER SUCT 3000ML PPV (MISCELLANEOUS) ×2 IMPLANT
CATH FOLEY 2WAY  5CC 16FR (CATHETERS)
CATH ROBINSON RED A/P 16FR (CATHETERS) ×2 IMPLANT
CATH URTH 16FR FL 2W BLN LF (CATHETERS) IMPLANT
COVER WAND RF STERILE (DRAPES) ×2 IMPLANT
DEVICE MYOSURE LITE (MISCELLANEOUS) IMPLANT
ELECT REM PT RETURN 9FT ADLT (ELECTROSURGICAL) ×2
ELECTRODE REM PT RTRN 9FT ADLT (ELECTROSURGICAL) ×1 IMPLANT
GLOVE BIO SURGEON STRL SZ7 (GLOVE) ×5 IMPLANT
GLOVE BIOGEL PI IND STRL 7.5 (GLOVE) ×1 IMPLANT
GLOVE BIOGEL PI INDICATOR 7.5 (GLOVE) ×4
GOWN STRL REUS W/ TWL LRG LVL3 (GOWN DISPOSABLE) ×2 IMPLANT
GOWN STRL REUS W/TWL LRG LVL3 (GOWN DISPOSABLE) ×2
IV LACTATED RINGERS 1000ML (IV SOLUTION) ×1 IMPLANT
IV NS IRRIG 3000ML ARTHROMATIC (IV SOLUTION) ×5 IMPLANT
KIT PROCEDURE FLUENT (KITS) IMPLANT
KIT TURNOVER CYSTO (KITS) ×2 IMPLANT
MYOSURE XL FIBROID (MISCELLANEOUS) ×4
PACK DNC HYST (MISCELLANEOUS) ×2 IMPLANT
PAD OB MATERNITY 4.3X12.25 (PERSONAL CARE ITEMS) ×2 IMPLANT
PAD PREP 24X41 OB/GYN DISP (PERSONAL CARE ITEMS) ×2 IMPLANT
SEAL ROD LENS SCOPE MYOSURE (ABLATOR) ×2 IMPLANT
SOL .9 NS 3000ML IRR  AL (IV SOLUTION) ×2
SOL .9 NS 3000ML IRR UROMATIC (IV SOLUTION) IMPLANT
SYSTEM TISS REMOVAL MYOSURE XL (MISCELLANEOUS) IMPLANT
TUBING CONNECTING 10 (TUBING) ×2 IMPLANT

## 2019-08-31 NOTE — Anesthesia Procedure Notes (Addendum)
Procedure Name: Intubation Date/Time: 08/31/2019 12:30 PM Performed by: Lewanda Rife, CRNA Pre-anesthesia Checklist: Patient identified, Emergency Drugs available, Suction available, Patient being monitored and Timeout performed Patient Re-evaluated:Patient Re-evaluated prior to induction Oxygen Delivery Method: Circle system utilized Preoxygenation: Pre-oxygenation with 100% oxygen Induction Type: IV induction and Cricoid Pressure applied Ventilation: Mask ventilation without difficulty Laryngoscope Size: Mac and 4 Grade View: Grade I Tube type: Oral Tube size: 7.0 mm Number of attempts: 1 Airway Equipment and Method: Stylet Placement Confirmation: ETT inserted through vocal cords under direct vision,  positive ETCO2 and breath sounds checked- equal and bilateral Secured at: 22 cm Tube secured with: Tape

## 2019-08-31 NOTE — Anesthesia Preprocedure Evaluation (Addendum)
Anesthesia Evaluation  Patient identified by MRN, date of birth, ID band Patient awake    Reviewed: Allergy & Precautions, H&P , NPO status , reviewed documented beta blocker date and time   Airway Mallampati: III  TM Distance: >3 FB Neck ROM: full    Dental  (+) Chipped   Pulmonary asthma ,    Pulmonary exam normal        Cardiovascular Normal cardiovascular exam     Neuro/Psych  Headaches,    GI/Hepatic   Endo/Other    Renal/GU Renal disease     Musculoskeletal   Abdominal   Peds  Hematology  (+) Blood dyscrasia, anemia ,   Anesthesia Other Findings Past Medical History: No date: Asthma No date: Bone spur 03/2019: DVT (deep venous thrombosis) (HCC)     Comment:  x 1 No date: Factor 5 Leiden mutation, heterozygous ([place])     Comment:  stopped Xarelto 08/23/19 No date: Fibroid No date: History of kidney stones No date: IDA (iron deficiency anemia)     Comment:  vitamin b12 and iron deficiency No date: Migraines No date: PCOS (polycystic ovarian syndrome) Past Surgical History: 07/28/2019: CHOLECYSTECTOMY; N/A     Comment:  Procedure: LAPAROSCOPIC CHOLECYSTECTOMY;  Surgeon:               Jules Husbands, MD;  Location: [facility] ORS;  Service:               General;  Laterality: N/A; No date: MYOMECTOMY No date: TONSILLECTOMY No date: WISDOM TOOTH EXTRACTION No date: WRIST SURGERY     Comment:  age 89 - rebroke it b/c it grew back wrong BMI    Body Mass Index: 48.61 kg/m     Reproductive/Obstetrics                            Anesthesia Physical Anesthesia Plan  ASA: III  Anesthesia Plan: General   Post-op Pain Management:    Induction: Intravenous  PONV Risk Score and Plan: Ondansetron, Treatment may vary due to age or medical condition, Midazolam and Dexamethasone  Airway Management Planned: LMA and Oral ETT  Additional Equipment:   Intra-op Plan:   Post-operative Plan:  Extubation in OR  Informed Consent: I have reviewed the patients History and Physical, chart, labs and discussed the procedure including the risks, benefits and alternatives for the proposed anesthesia with the patient or authorized representative who has indicated his/her understanding and acceptance.     Dental Advisory Given  Plan Discussed with: CRNA  Anesthesia Plan Comments: (LMA vs ETT to be determined by est length of procedure)       Anesthesia Quick Evaluation

## 2019-08-31 NOTE — Op Note (Signed)
Operative Note   08/31/2019  PRE-OP DIAGNOSIS:  1) Menorrhagia with irregular cycle (N92.1) 2) Intramural and submucosal leiomyoma of uterus (D25.1, D25.0) 3) Dysmenorrhea (N94.6) 4) Factor 5 Leiden mutation, Heterozygous (HCC) D68.51 5) Iron Deficiency anemia due to chronic blood loss (D50.0)   POST-OP DIAGNOSIS:  1) Menorrhagia with irregular cycle (N92.1) 2) Intramural and submucosal leiomyoma of uterus (D25.1, D25.0) 3) Dysmenorrhea (N94.6) 4) Factor 5 Leiden mutation, Heterozygous (HCC) D68.51 5) Iron Deficiency anemia due to chronic blood loss (D50.0)   SURGEON: Surgeon(s) and Role:    * Will Bonnet, MD - Primary  PROCEDURE: Procedure(s): 1) Hysteroscopy 2) dilation and curettage 3) submucosal myomectomy using MyoSure  ANESTHESIA: General ET  ESTIMATED BLOOD LOSS: 100 mL  DRAINS: none   TOTAL IV FLUIDS: 1,000 mL crystalloid  SPECIMENS: Submucosal leiomyoma in fragments for permanent  VTE PROPHYLAXIS: SCDs to the bilateral lower extremities  ANTIBIOTICS: none indicated, none given  FLUID DEFICIT: 2,500 mL (stopped procedure at this point due to maximum deficit)  COMPLICATIONS: none  DISPOSITION: PACU - hemodynamically stable.  CONDITION: stable  INDICATION: 38 y.o. G0P0000 female with suspected large leiomyoma with significant submucosal component.  Menorrhagia with irregular cycles and resultant iron deficiency anemia due to chronic blood loss.  The patient desires to retain fertility.  FINDINGS: Exam under anesthesia revealed enlarged, mobile midplane uterus. Hysteroscopy revealed an enlarged uterine cavity with multiple submucosal myoma apparent.  There was a large left fundal fibroid, a large right lateral sidewall fibroid, and multiple other fibroids that were posterior, fundal, and anterior.  It took approximately 200 mL's of fluid to distend the cavity.  PROCEDURE IN DETAIL:  After informed consent was obtained, the patient was taken to the  operating room where anesthesia was obtained without difficulty. The patient was positioned in the dorsal lithotomy position in [place].  The patient's bladder was catheterized with an in and out foley catheter.  The patient was examined under anesthesia, with the above noted findings.  The bi-valved speculum was placed inside the patient's vagina, and the the anterior lip of the cervix was seen and grasped with the tenaculum.    The cervix was injected with a total of 5 mL's of vasopressin, diluted in a mixture of 20 units of vasopressin and 100 mL of normal saline.  At 3:00 and 9:00 on the cervix 2.5 mL's of vasopressin diluted was injected.  The cervix was progressively dilated to a 7 mm Hegar dilator.  The hysteroscope was introduced, with the above noted findings.  Due to leaking of fluid through the cervix an additional tenaculum was placed around the hysteroscope to reduce loss of fluid through the cervix.  Utilizing the MyoSure XL, an anterior left fibroid was removed without difficulty.  The large fundal and left-sided fibroid was removed.  It was attempted to remove the right lateral sidewall fibroid and most of the base was removed from the uterus with only a fraction of the fibroid still attached.  However, due to reaching the maximum fluid deficit the procedure had to be terminated while leaving a significant portion of the detached fibroid still connected to the remaining attached remnant of fibroid on that sidewall.  At this point the procedure was terminated with hemostasis noted with decreasing pressure in the uterine cavity.  The hysteroscope was removed from the uterine cavity.  The tenaculum's were removed from the cervix with hemostasis achieved using silver nitrate.  The cervix was monitored for 10 minutes and absent to minimal bleeding  was noted from the uterus at the cervix.  The speculum was removed after verifying that no instruments or sponges were left in the vagina.  She was  then taken out of dorsal lithotomy.  The patient tolerated the procedure well.  Sponge, lap and needle counts were correct x2.  The patient was taken to recovery room in excellent condition.  Will Bonnet, MD, [place] 08/31/2019 2:31 PM

## 2019-08-31 NOTE — Interval H&P Note (Signed)
History and Physical Interval Note:  08/31/2019 9:29 AM  Tara Shea  has presented today for surgery, with the diagnosis of Menorrhagia with irregular cycle N92.1 Uterine mass N85.8 Intramural and submucous leiomyoma of uterus D25.1, D25.0 Factor V [place] History of DVT.  The various methods of treatment have been discussed with the patient and family. After consideration of risks, benefits and other options for treatment, the patient has consented to  Procedure(s): DILATATION AND CURETTAGE /HYSTEROSCOPY/ SUBMUCOSAL MYOMECTOMY (N/A) as a surgical intervention.  The patient's history has been reviewed, patient examined, no change in status, stable for surgery.  I have reviewed the patient's chart and labs.  Questions were answered to the patient's satisfaction.  Consents reviewed.  Patient agrees to proceed.  Prentice Docker, MD, [facility], [facility] 08/31/2019 9:29 AM

## 2019-08-31 NOTE — Transfer of Care (Signed)
Immediate Anesthesia Transfer of Care Note  Patient: Tara Shea  Procedure(s) Performed: DILATATION AND CURETTAGE /HYSTEROSCOPY/ SUBMUCOSAL MYOMECTOMY (N/A )  Patient Location: PACU  Anesthesia Type:General  Level of Consciousness: awake, alert  and oriented  Airway & Oxygen Therapy: Patient Spontanous Breathing  Post-op Assessment: Report given to RN  Post vital signs: Reviewed  Last Vitals:  Vitals Value Taken Time  BP 114/75 08/31/19 1433  Temp    Pulse 88 08/31/19 1434  Resp 21 08/31/19 1434  SpO2 100 % 08/31/19 1434  Vitals shown include unvalidated device data.  Last Pain:  Vitals:   08/31/19 0849  TempSrc: Temporal  PainSc: 0-No pain         Complications: No apparent anesthesia complications

## 2019-08-31 NOTE — Anesthesia Post-op Follow-up Note (Signed)
Anesthesia QCDR form completed.        

## 2019-08-31 NOTE — Discharge Instructions (Signed)

## 2019-09-01 ENCOUNTER — Encounter: Payer: Self-pay | Admitting: Obstetrics and Gynecology

## 2019-09-01 ENCOUNTER — Telehealth: Payer: Self-pay | Admitting: Obstetrics and Gynecology

## 2019-09-01 ENCOUNTER — Telehealth: Payer: Self-pay | Admitting: Internal Medicine

## 2019-09-01 NOTE — Telephone Encounter (Signed)
Left generic VM 

## 2019-09-01 NOTE — Telephone Encounter (Signed)
Discussed the patient's care with Dr. Glennon Mac in the morning.  Spoke to patient this evening patient has not experienced any heavy vaginal bleeding.  Minimal spotting noted.  Discussed with patient to start Xarelto 10 mg/prophylactic dose starting 10/10-for 7 days.  Recommend to stop Xarelto if heavy vaginal bleeding noted.  Patient verbalized understanding the same.

## 2019-09-04 ENCOUNTER — Encounter: Payer: Self-pay | Admitting: Internal Medicine

## 2019-09-05 ENCOUNTER — Other Ambulatory Visit: Payer: Self-pay | Admitting: Obstetrics and Gynecology

## 2019-09-05 ENCOUNTER — Other Ambulatory Visit: Payer: Self-pay

## 2019-09-05 ENCOUNTER — Other Ambulatory Visit: Payer: 59

## 2019-09-05 ENCOUNTER — Telehealth: Payer: Self-pay | Admitting: Internal Medicine

## 2019-09-05 ENCOUNTER — Ambulatory Visit: Payer: Self-pay

## 2019-09-05 DIAGNOSIS — N99820 Postprocedural hemorrhage and hematoma of a genitourinary system organ or structure following a genitourinary system procedure: Secondary | ICD-10-CM

## 2019-09-05 LAB — SURGICAL PATHOLOGY

## 2019-09-05 NOTE — Telephone Encounter (Signed)
Status post myomectomy; 10 mg of Xarelto prophylaxis for DVT.  Noted to have heavy vaginal bleeding.  As per the recommendations from GYN-patient stopped taking Xarelto; last dose of Xarelto 10/11.  Recommend holding off further Xarelto given increased vaginal bleeding.  Recommend increased ambulation.  Patient had labs with gynecology; awaiting results.  Patient will call us to inform if significantly low for IV iron infusions.

## 2019-09-05 NOTE — Telephone Encounter (Signed)
Patient called stating that she that she had surgery on Thursday for fibroids.  She was sent home and placed on xarelto for a blood clotting disorder.  She states that she had heavy vaginal bleeding all weekend that has slowed today.Today one pad per 3 hours. She states that she is feeling weak and lightheaded. She states she is able to walk but says she feels she may be anemic. She has called her OBGYN who told her to stop the blood thinner and request that she call her hematologist. She states that she has a message in for him but has not heard back. She is calling requesting appointment. Care advice given. Call transferred to office for scheduling.   Reason for Disposition . Taking Coumadin (warfarin) or other strong blood thinner, or known bleeding disorder (e.g., thrombocytopenia)  Answer Assessment - Initial Assessment Questions 1. AMOUNT: "Describe the bleeding that you are having."    - SPOTTING: spotting, or pinkish / brownish mucous discharge; does not fill panti-liner or pad    - MILD:  less than 1 pad / hour; less than patient's usual menstrual bleeding   - MODERATE: 1-2 pads / hour; 1 menstrual cup every 6 hours; small-medium blood clots (e.g., pea, grape, small coin)   - SEVERE: soaking 2 or more pads/hour for 2 or more hours; 1 menstrual cup every 2 hours; bleeding not contained by pads or continuous red blood from vagina; large blood clots (e.g., golf ball, large coin)      12 hour pad every hour 2. ONSET: "When did the bleeding begin?" "Is it continuing now?"     Friday night through Sunday night 3. MENSTRUAL PERIOD: "When was the last normal menstrual period?" "How is this different than your period?"     Heave periods usually 4. REGULARITY: "How regular are your periods?"     irregular 5. ABDOMINAL PAIN: "Do you have any pain?" "How bad is the pain?"  (e.g., Scale 1-10; mild, moderate, or severe)   - MILD (1-3): doesn't interfere with normal activities, abdomen soft and not  tender to touch    - MODERATE (4-7): interferes with normal activities or awakens from sleep, tender to touch    - SEVERE (8-10): excruciating pain, doubled over, unable to do any normal activities      normal 6. PREGNANCY: "Could you be pregnant?" "Are you sexually active?" "Did you recently give birth?"    N/A 7. BREASTFEEDING: "Are you breastfeeding?"    No 8. HORMONES: "Are you taking any hormone medications, prescription or OTC?" (e.g., birth control pills, estrogen)    No 9. BLOOD THINNERS: "Do you take any blood thinners?" (e.g., Coumadin/warfarin, Pradaxa/dabigatran, aspirin)    xarelto 10. CAUSE: "What do you think is causing the bleeding?" (e.g., recent gyn surgery, recent gyn procedure; known bleeding disorder, cervical cancer, polycystic ovarian disease, fibroids)         Anemia from surgery 11. HEMODYNAMIC STATUS: "Are you weak or feeling lightheaded?" If so, ask: "Can you stand and walk normally?"       Lightheaded **12. OTHER SYMPTOMS: "What other symptoms are you having with the bleeding?" (e.g., passed tissue, vaginal discharge, fever, menstrual-type cramps)       none  Protocols used: VAGINAL BLEEDING - ABNORMAL-A-AH

## 2019-09-05 NOTE — Telephone Encounter (Signed)
It appears she has an appointment with Dr. Glennon Mac today at 2:20pm.  If she missed this appointment, she will need to reschedule with him and/or with the hematologist.  Please let me know if she needs an appointment with our office and we will work her in tomorrow.  If anything worsens overnight she'll need to go to ER.

## 2019-09-05 NOTE — Telephone Encounter (Signed)
Please advise 

## 2019-09-06 ENCOUNTER — Ambulatory Visit: Payer: 59 | Admitting: Family Medicine

## 2019-09-06 LAB — CBC WITH DIFFERENTIAL/PLATELET
Basophils Absolute: 0 10*3/uL (ref 0.0–0.2)
Basos: 1 %
EOS (ABSOLUTE): 0.2 10*3/uL (ref 0.0–0.4)
Eos: 3 %
Hematocrit: 38.5 % (ref 34.0–46.6)
Hemoglobin: 12.3 g/dL (ref 11.1–15.9)
Immature Grans (Abs): 0 10*3/uL (ref 0.0–0.1)
Immature Granulocytes: 0 %
Lymphocytes Absolute: 2.4 10*3/uL (ref 0.7–3.1)
Lymphs: 39 %
MCH: 27 pg (ref 26.6–33.0)
MCHC: 31.9 g/dL (ref 31.5–35.7)
MCV: 85 fL (ref 79–97)
Monocytes Absolute: 0.4 10*3/uL (ref 0.1–0.9)
Monocytes: 6 %
Neutrophils Absolute: 3.2 10*3/uL (ref 1.4–7.0)
Neutrophils: 51 %
Platelets: 259 10*3/uL (ref 150–450)
RBC: 4.55 x10E6/uL (ref 3.77–5.28)
RDW: 16.4 % — ABNORMAL HIGH (ref 11.7–15.4)
WBC: 6.2 10*3/uL (ref 3.4–10.8)

## 2019-09-06 LAB — COMPREHENSIVE METABOLIC PANEL
ALT: 13 IU/L (ref 0–32)
AST: 11 IU/L (ref 0–40)
Albumin/Globulin Ratio: 1.4 (ref 1.2–2.2)
Albumin: 4.1 g/dL (ref 3.8–4.8)
Alkaline Phosphatase: 72 IU/L (ref 39–117)
BUN/Creatinine Ratio: 23 (ref 9–23)
BUN: 18 mg/dL (ref 6–20)
Bilirubin Total: <0.2 mg/dL (ref 0.0–1.2)
CO2: 24 mmol/L (ref 20–29)
Calcium: 9.6 mg/dL (ref 8.7–10.2)
Chloride: 103 mmol/L (ref 96–106)
Creatinine, Ser: 0.8 mg/dL (ref 0.57–1.00)
GFR calc Af Amer: 108 mL/min/{1.73_m2} (ref >59)
GFR calc non Af Amer: 94 mL/min/{1.73_m2} (ref >59)
Globulin, Total: 2.9 g/dL (ref 1.5–4.5)
Glucose: 114 mg/dL — ABNORMAL HIGH (ref 65–99)
Potassium: 4.1 mmol/L (ref 3.5–5.2)
Sodium: 140 mmol/L (ref 134–144)
Total Protein: 7 g/dL (ref 6.0–8.5)

## 2019-09-06 NOTE — Telephone Encounter (Signed)
Left message for patient to call office.  

## 2019-09-07 NOTE — Anesthesia Postprocedure Evaluation (Signed)
Anesthesia Post Note  Patient: Tara Shea  Procedure(s) Performed: DILATATION AND CURETTAGE /HYSTEROSCOPY/ SUBMUCOSAL MYOMECTOMY (N/A )  Patient location during evaluation: PACU Anesthesia Type: General Level of consciousness: awake and alert Pain management: pain level controlled Vital Signs Assessment: post-procedure vital signs reviewed and stable Respiratory status: spontaneous breathing, nonlabored ventilation, respiratory function stable and patient connected to nasal cannula oxygen Cardiovascular status: blood pressure returned to baseline and stable Postop Assessment: no apparent nausea or vomiting Anesthetic complications: no     Last Vitals:  Vitals:   08/31/19 1701 08/31/19 1741  BP: 120/76 123/74  Pulse: 61 71  Resp: 16 18  Temp: 36.7 C   SpO2: 100% 100%    Last Pain:  Vitals:   08/31/19 1741  TempSrc:   PainSc: 0-No pain                 Alphonsus Sias

## 2019-09-08 ENCOUNTER — Other Ambulatory Visit: Payer: Self-pay

## 2019-09-08 ENCOUNTER — Ambulatory Visit: Payer: 59 | Admitting: Family Medicine

## 2019-09-08 ENCOUNTER — Encounter: Payer: Self-pay | Admitting: Family Medicine

## 2019-09-08 VITALS — BP 120/80 | HR 96 | Temp 97.2°F | Resp 16 | Ht 68.0 in | Wt 314.0 lb

## 2019-09-08 DIAGNOSIS — R42 Dizziness and giddiness: Secondary | ICD-10-CM

## 2019-09-08 DIAGNOSIS — J0110 Acute frontal sinusitis, unspecified: Secondary | ICD-10-CM | POA: Diagnosis not present

## 2019-09-08 MED ORDER — AMOXICILLIN-POT CLAVULANATE 875-125 MG PO TABS: 1.0000 | ORAL_TABLET | Freq: Two times a day (BID) | ORAL | 0 refills | Status: AC

## 2019-09-08 NOTE — Progress Notes (Signed)
Name: Tara Shea   MRN: [id number]    DOB: 01-Nov-1981   Date:09/08/2019       Progress Note  Subjective  Chief Complaint  Chief Complaint  Patient presents with  . Dizziness    facial and ear pressure for 6 days    HPI  PT presents for lightheadedness, dizziness, and sinus congestion/ear congestion.  She had fibroid removal by Dr. Glennon Mac about 8 days ago. She had heavy bleeding afterwards, stopped Xarelto and the bleeding did slow - she is still spotting some.  She did have labs done on 09/05/2019 - her CBC did not show anemia, CMP was normal. She notes that the last few days she has really rehydrated, but still having some mild lightheadedness, one episode of vertigo last night that has since dissipated; also now having sinus congestion (tried nasacort and not providing relief), and radiating back into the ears.    Patient Active Problem List   Diagnosis Date Noted  . Intramural and submucous leiomyoma of uterus 08/31/2019  . Menorrhagia with irregular cycle 08/31/2019  . Dysmenorrhea 08/31/2019  . B12 deficiency 08/09/2019  . Cholelithiasis 07/26/2019  . Iron deficiency anemia due to chronic blood loss 06/22/2019  . Acute deep vein thrombosis (DVT) of calf muscle vein of right lower extremity ([place]) 04/18/2019  . Factor V Leiden mutation ([place])   . Allergic rhinitis 03/01/2019  . Kidney stone 08/02/2018  . Asthma, mild intermittent 08/02/2018  . Migraine headache with aura 08/02/2018  . Morbid obesity ([place]) 08/02/2018    Social History   Tobacco Use  . Smoking status: Never Smoker  . Smokeless tobacco: Never Used  Substance Use Topics  . Alcohol use: Yes    Comment: occ     Current Outpatient Medications:  .  albuterol (PROVENTIL HFA;VENTOLIN HFA) 108 (90 Base) MCG/ACT inhaler, Inhale 2 puffs into the lungs every 4 (four) hours as needed for wheezing or shortness of breath., Disp: 1 Inhaler, Rfl: 2 .  albuterol (PROVENTIL) (2.5 MG/3ML) 0.083% nebulizer  solution, Take 3 mLs (2.5 mg total) by nebulization every 6 (six) hours as needed for wheezing or shortness of breath., Disp: 75 mL, Rfl: 12 .  ferrous sulfate 325 (65 FE) MG tablet, Take 325 mg by mouth daily., Disp: , Rfl:  .  fluticasone (FLOVENT HFA) 220 MCG/ACT inhaler, Inhale 2 puffs into the lungs 2 (two) times daily., Disp: 1 Inhaler, Rfl: 12 .  ibuprofen (ADVIL) 600 MG tablet, Take 1 tablet (600 mg total) by mouth every 6 (six) hours as needed for mild pain or cramping., Disp: 30 tablet, Rfl: 0 .  Multiple Vitamins-Minerals (MULTIVITAMIN WITH MINERALS) tablet, Take 1 tablet by mouth daily. DUPLICATE ENTRY, Disp: , Rfl:  .  ondansetron (ZOFRAN ODT) 4 MG disintegrating tablet, Take 1 tablet (4 mg total) by mouth every 6 (six) hours as needed for nausea., Disp: 20 tablet, Rfl: 0 .  promethazine (PHENERGAN) 25 MG tablet, Take 1 tablet (25 mg total) by mouth every 8 (eight) hours as needed for nausea or vomiting., Disp: 30 tablet, Rfl: 0 .  Spacer/Aero-Holding Chambers (AEROCHAMBER PLUS) inhaler, Use as instructed, Disp: 1 each, Rfl: 2 .  vitamin B-12 (CYANOCOBALAMIN) 1000 MCG tablet, Take 1 tablet (1,000 mcg total) by mouth daily., Disp: 30 tablet, Rfl: 0 .  vitamin C (ASCORBIC ACID) 500 MG tablet, Take 500 mg by mouth daily., Disp: , Rfl:  .  HYDROcodone-acetaminophen (NORCO/VICODIN) 5-325 MG tablet, Take 1 tablet by mouth every 8 (eight) hours as  needed (breakthrough pain). (Patient not taking: Reported on 09/08/2019), Disp: 10 tablet, Rfl: 0 .  rivaroxaban (XARELTO) 10 MG TABS tablet, Take 1 tablet (10 mg total) by mouth daily. DO NOT START UNTIL OKAY BY MD. (Patient not taking: Reported on 09/08/2019), Disp: 7 tablet, Rfl: 0  Not on File  I personally reviewed active problem list, medication list, allergies, notes from last encounter, lab results with the patient/caregiver today.  ROS  Ten systems reviewed and is negative except as mentioned in HPI  Objective  Vitals:   09/08/19 1109   BP: 120/80  Pulse: 96  Resp: 16  Temp: (!) 97.2 F (36.2 C)  TempSrc: Oral  SpO2: 98%  Weight: (!) 314 lb (142.4 kg)  Height: 5\' 8"  (1.727 m)   Body mass index is 47.74 kg/m.  Nursing Note and Vital Signs reviewed.  Physical Exam  Constitutional: Patient appears well-developed and well-nourished. Obese. No distress.  HEENT: head atraumatic, normocephalic, pupils equal and reactive to light, RIGHT TM without erythema or edema; LEFT TM with mild erythema and slightly bulging,  bilateral frontal sinuses are tender to palpitations; bilateral maxillary sinuses are non-tender, neck supple without lymphadenopathy, throat within normal limits - no erythema or exudate, no tonsillar swelling Cardiovascular: Normal rate, regular rhythm and normal heart sounds.  No murmur heard. No BLE edema. Pulmonary/Chest: Effort normal and breath sounds clear bilaterally. No respiratory distress. Abdominal: Soft, bowel sounds normal, there is no tenderness, no HSM Psychiatric: Patient has a normal mood and affect. behavior is normal. Judgment and thought content normal.  Results for orders placed or performed in visit on 09/05/19 (from the past 72 hour(s))  CBC with Differential/Platelet     Status: Abnormal   Collection Time: 09/05/19  2:32 PM  Result Value Ref Range   WBC 6.2 3.4 - 10.8 x10E3/uL   RBC 4.55 3.77 - 5.28 x10E6/uL   Hemoglobin 12.3 11.1 - 15.9 g/dL   Hematocrit 38.5 34.0 - 46.6 %   MCV 85 79 - 97 fL   MCH 27.0 26.6 - 33.0 pg   MCHC 31.9 31.5 - 35.7 g/dL   RDW 16.4 (H) 11.7 - 15.4 %   Platelets 259 150 - 450 x10E3/uL   Neutrophils 51 Not Estab. %   Lymphs 39 Not Estab. %   Monocytes 6 Not Estab. %   Eos 3 Not Estab. %   Basos 1 Not Estab. %   Neutrophils Absolute 3.2 1.4 - 7.0 x10E3/uL   Lymphocytes Absolute 2.4 0.7 - 3.1 x10E3/uL   Monocytes Absolute 0.4 0.1 - 0.9 x10E3/uL   EOS (ABSOLUTE) 0.2 0.0 - 0.4 x10E3/uL   Basophils Absolute 0.0 0.0 - 0.2 x10E3/uL   Immature  Granulocytes 0 Not Estab. %   Immature Grans (Abs) 0.0 0.0 - 0.1 x10E3/uL  Comprehensive metabolic panel     Status: Abnormal   Collection Time: 09/05/19  2:32 PM  Result Value Ref Range   Glucose 114 (H) 65 - 99 mg/dL   BUN 18 6 - 20 mg/dL   Creatinine, Ser 0.80 0.57 - 1.00 mg/dL   GFR calc non Af Amer 94 >59 mL/min/1.73   GFR calc Af Amer 108 >59 mL/min/1.73   BUN/Creatinine Ratio 23 9 - 23   Sodium 140 134 - 144 mmol/L   Potassium 4.1 3.5 - 5.2 mmol/L   Chloride 103 96 - 106 mmol/L   CO2 24 20 - 29 mmol/L   Calcium 9.6 8.7 - 10.2 mg/dL   Total Protein 7.0 6.0 -  8.5 g/dL   Albumin 4.1 3.8 - 4.8 g/dL   Globulin, Total 2.9 1.5 - 4.5 g/dL   Albumin/Globulin Ratio 1.4 1.2 - 2.2   Bilirubin Total <0.2 0.0 - 1.2 mg/dL   Alkaline Phosphatase 72 39 - 117 IU/L   AST 11 0 - 40 IU/L   ALT 13 0 - 32 IU/L    Assessment & Plan  1. Acute non-recurrent frontal sinusitis - amoxicillin-clavulanate (AUGMENTIN) 875-125 MG tablet; Take 1 tablet by mouth 2 (two) times daily for 10 days.  Dispense: 20 tablet; Refill: 0 - Continue Nasacort  2. Lightheadedness - Improving; continue to hydrate; may also be secondary to sinusitis.   -Red flags and when to present for emergency care or RTC including fever >101.83F, chest pain, shortness of breath, new/worsening/un-resolving symptoms, reviewed with patient at time of visit. Follow up and care instructions discussed and provided in AVS.

## 2019-09-13 ENCOUNTER — Other Ambulatory Visit: Payer: Self-pay

## 2019-09-13 ENCOUNTER — Ambulatory Visit (INDEPENDENT_AMBULATORY_CARE_PROVIDER_SITE_OTHER): Payer: 59 | Admitting: Obstetrics and Gynecology

## 2019-09-13 ENCOUNTER — Encounter: Payer: Self-pay | Admitting: Obstetrics and Gynecology

## 2019-09-13 VITALS — BP 128/88 | Ht 68.0 in | Wt 317.0 lb

## 2019-09-13 DIAGNOSIS — N921 Excessive and frequent menstruation with irregular cycle: Secondary | ICD-10-CM

## 2019-09-13 DIAGNOSIS — D251 Intramural leiomyoma of uterus: Secondary | ICD-10-CM | POA: Diagnosis not present

## 2019-09-13 DIAGNOSIS — D250 Submucous leiomyoma of uterus: Secondary | ICD-10-CM | POA: Diagnosis not present

## 2019-09-13 DIAGNOSIS — D500 Iron deficiency anemia secondary to blood loss (chronic): Secondary | ICD-10-CM | POA: Diagnosis not present

## 2019-09-13 DIAGNOSIS — D6851 Activated protein C resistance: Secondary | ICD-10-CM | POA: Diagnosis not present

## 2019-09-13 DIAGNOSIS — N946 Dysmenorrhea, unspecified: Secondary | ICD-10-CM

## 2019-09-13 NOTE — Progress Notes (Signed)
   Postoperative Follow-up Patient presents post op from hysteroscopy, dilation and curettage, myomectomy 2weeks ago for menorrhagia with irregular cycle, fibroid uterus, dysmenorrhea, iron deficiency anemia.  Subjective: Eating a regular diet without difficulty. The patient is not having any pain.  Activity: normal activities of daily living.  SHe was diagnosed with an ear infection after the surgery and she states taking the antibiotic made her dizzyness symptoms resolve.   Objective: Vitals:   09/13/19 1008  BP: 128/88   Vital Signs: BP 128/88   Ht 5\' 8"  (1.727 m)   Wt (!) 317 lb (143.8 kg)   BMI 48.20 kg/m  Constitutional: Well nourished, well developed female in no acute distress.  HEENT: normal Skin: Warm and dry.  Extremity: no edema visible    Assessment: 38 y.o. s/p the above-noted surgery progressing well  Plan: Patient has done well after surgery with no apparent complications.  I have discussed the post-operative course to date, and the expected progress moving forward.  The patient understands what complications to be concerned about.  I will see the patient in routine follow up, or sooner if needed.    Activity plan: No restriction.  Plan to return to the OR for another attempt to remove the remaining fibroids as she greatly desires to conceive still.   Prentice Docker, MD 09/13/2019, 10:28 AM

## 2019-09-19 ENCOUNTER — Telehealth: Payer: Self-pay | Admitting: Obstetrics and Gynecology

## 2019-09-19 NOTE — Telephone Encounter (Signed)
Patient is aware of H&P/consents day of surgery (okay per Dr. Glennon Mac), Pre-admit testing to be scheduled (patient will watch for notification in MyChart), COVID testing on 09/29/19, and OR on 10/03/19. Patient is aware she will be asked to quarantine after COVID testing. Patient is aware she may receive calls from the [facility] and [facility]. Patient confirmed UMR.

## 2019-09-19 NOTE — Telephone Encounter (Signed)
-----   Message from Will Bonnet, MD sent at 09/13/2019 10:35 AM EDT ----- Regarding: Schedule surgery Surgery Booking Request Patient Full Name:  Tara Shea  MRN: [id number]  DOB: 17-Apr-1981  Surgeon: Prentice Docker, MD  Requested Surgery Date and Time: sooner is better Primary Diagnosis AND Code:  1) Menorrhagia with irregular cycle: N92.1 2) Intramural and submucous fibroids (D25.1, D25.0) 3) iron deficiency anemia due to chronic blood loss (D50.0) 4) Factor V Leiden mutation (D68.51)  Secondary Diagnosis and Code:  Surgical Procedure: Hysteroscopy, dilation and curettage, submucosal myomectomy L&D Notification: No Admission Status: same day surgery Length of Surgery: 2 hrs Special Case Needs: yes. MyoSure device H&P: Yes Phone Interview???:  Yes Interpreter: No Language:  Medical Clearance:  No Special Scheduling Instructions: Schedule for soon. Prefer to have rep present Any known health/anesthesia issues, diabetes, sleep apnea, latex allergy, defibrillator/pacemaker?: factor V leiden mutation with history of DVT Acuity: P3   (P1 highest, P2 delay may cause harm, P3 low, elective gyn, P4 lowest)

## 2019-09-20 ENCOUNTER — Inpatient Hospital Stay: Payer: 59 | Attending: Internal Medicine

## 2019-09-20 ENCOUNTER — Ambulatory Visit: Payer: 59 | Admitting: Internal Medicine

## 2019-09-20 ENCOUNTER — Encounter: Payer: Self-pay | Admitting: Family Medicine

## 2019-09-20 ENCOUNTER — Ambulatory Visit: Payer: 59

## 2019-09-20 ENCOUNTER — Other Ambulatory Visit: Payer: Self-pay

## 2019-09-20 DIAGNOSIS — D509 Iron deficiency anemia, unspecified: Secondary | ICD-10-CM | POA: Diagnosis not present

## 2019-09-20 DIAGNOSIS — B379 Candidiasis, unspecified: Secondary | ICD-10-CM

## 2019-09-20 DIAGNOSIS — I82401 Acute embolism and thrombosis of unspecified deep veins of right lower extremity: Secondary | ICD-10-CM | POA: Diagnosis not present

## 2019-09-20 DIAGNOSIS — I82461 Acute embolism and thrombosis of right calf muscular vein: Secondary | ICD-10-CM

## 2019-09-20 DIAGNOSIS — T3695XA Adverse effect of unspecified systemic antibiotic, initial encounter: Secondary | ICD-10-CM

## 2019-09-20 LAB — ANTITHROMBIN III: AntiThromb III Func: 94 % (ref 75–120)

## 2019-09-20 LAB — LACTATE DEHYDROGENASE: LDH: 125 U/L (ref 98–192)

## 2019-09-20 MED ORDER — FLUCONAZOLE 150 MG PO TABS: 150.0000 mg | ORAL_TABLET | Freq: Once | ORAL | 0 refills | Status: AC

## 2019-09-21 ENCOUNTER — Telehealth: Payer: Self-pay

## 2019-09-21 NOTE — Telephone Encounter (Signed)
FMLA/DISABILITY form for [employer] filled out, signature obtained and given to KT for processing. 

## 2019-09-22 LAB — BETA-2-GLYCOPROTEIN I ABS, IGG/M/A
Beta-2 Glyco I IgG: <9 GPI IgG units (ref 0–20)
Beta-2-Glycoprotein I IgA: <9 GPI IgA units (ref 0–25)
Beta-2-Glycoprotein I IgM: <9 GPI IgM units (ref 0–32)

## 2019-09-22 LAB — CARDIOLIPIN ANTIBODIES, IGG, IGM, IGA
Anticardiolipin IgA: <9 APL U/mL (ref 0–11)
Anticardiolipin IgG: <9 GPL U/mL (ref 0–14)
Anticardiolipin IgM: <9 MPL U/mL (ref 0–12)

## 2019-09-22 LAB — PROTEIN C ACTIVITY: Protein C Activity: 125 % (ref 73–180)

## 2019-09-22 LAB — LUPUS ANTICOAGULANT
DRVVT: 25.7 s (ref 0.0–47.0)
PTT Lupus Anticoagulant: 30.1 s (ref 0.0–51.9)
Thrombin Time: 15.8 s (ref 0.0–23.0)
dPT Confirm Ratio: 0.95 Ratio (ref 0.00–1.40)
dPT: 30.2 s (ref 0.0–55.0)

## 2019-09-22 LAB — PROTEIN C, TOTAL: Protein C, Total: 93 % (ref 60–150)

## 2019-09-22 LAB — PROTEIN S, TOTAL: Protein S Ag, Total: 111 % (ref 60–150)

## 2019-09-22 LAB — PROTEIN S ACTIVITY: Protein S Activity: 83 % (ref 63–140)

## 2019-09-25 LAB — FACTOR 5 LEIDEN

## 2019-09-26 LAB — PROTHROMBIN GENE MUTATION

## 2019-09-28 ENCOUNTER — Encounter
Admission: RE | Admit: 2019-09-28 | Discharge: 2019-09-28 | Disposition: A | Discharge status: Home / Self Care | Payer: 59 | Source: Ambulatory Visit | Attending: Obstetrics and Gynecology | Admitting: Obstetrics and Gynecology

## 2019-09-28 NOTE — Patient Instructions (Addendum)
Your procedure is scheduled on: 10-03-19 TUESDAY Report to Same Day Surgery 2nd floor medical mall [facility] Entrance-take elevator on left to 2nd floor.  Check in with surgery information desk.) To find out your arrival time please call [phone] between 1PM - 3PM on 10-02-19 MONDAY  Remember: Instructions that are not followed completely may result in serious medical risk, up to and including death, or upon the discretion of your surgeon and anesthesiologist your surgery may need to be rescheduled.    _x___ 1. Do not eat food after midnight the night before your procedure. NO GUM OR CANDY AFTER MIDNIGHT. You may drink clear liquids up to 2 hours before you are scheduled to arrive at the hospital for your procedure.  Do not drink clear liquids within 2 hours of your scheduled arrival to the hospital.  Clear liquids include  --Water or Apple juice without pulp  --Gatorade  --Black Coffee or Clear Tea (No milk, no creamers, do not add anything to the coffee or Tea   ____Ensure clear carbohydrate drink on the way to the hospital for bariatric patients  _X___Ensure clear carbohydrate drink 3 hours before surgery.     __x__ 2. No Alcohol for 24 hours before or after surgery.   __x__3. No Smoking or e-cigarettes for 24 prior to surgery.  Do not use any chewable tobacco products for at least 6 hour prior to surgery   ____  4. Bring all medications with you on the day of surgery if instructed.    __x__ 5. Notify your doctor if there is any change in your medical condition     (cold, fever, infections).    x___6. On the morning of surgery brush your teeth with toothpaste and water.  You may rinse your mouth with mouth wash if you wish.  Do not swallow any toothpaste or mouthwash.   Do not wear jewelry, make-up, hairpins, clips or nail polish.  Do not wear lotions, powders, or perfumes. You may wear deodorant.  Do not shave 48 hours prior to surgery. Men may shave face and neck.  Do  not bring valuables to the hospital.    [facility] is not responsible for any belongings or valuables.               Contacts, dentures or bridgework may not be worn into surgery.  Leave your suitcase in the car. After surgery it may be brought to your room.  For patients admitted to the hospital, discharge time is determined by your treatment team.  _  Patients discharged the day of surgery will not be allowed to drive home.  You will need someone to drive you home and stay with you the night of your procedure.    Please read over the following fact sheets that you were given:   [facility] Preparing for Surgery   ____ Take anti-hypertensive listed below, cardiac, seizure, asthma, anti-reflux and psychiatric medicines. These include:  1.YOU MAY TAKE ZOFRAN OR PHENERGAN AM OF SURGERY IF NEEDED WITH A SMALL SIP OF WATER  2.  3.  4.  5.  6.  ____Fleets enema or Magnesium Citrate as directed.   ____ Use CHG Soap or sage wipes as directed on instruction sheet   _X___ Use inhalers on the day of surgery and bring to hospital day of surgery-USE YOUR FLOVENT AND ALBUTEROL INHALER DAY OF SURGERY AND BRING ALBUTEROL [place]  ____ Stop Metformin and Janumet 2 days prior to surgery.  ____ Take 1/2 of usual insulin dose the night before surgery and none on the morning surgery.   ____ Follow recommendations from Cardiologist, Pulmonologist or PCP regarding stopping Aspirin, Coumadin, Plavix ,Eliquis, Effient, or Pradaxa, and Pletal.  X____Stop Anti-inflammatories such as Advil, Aleve, Ibuprofen, Motrin, Naproxen, Naprosyn, Goodies powders or aspirin products NOW-OK to take Tylenol    ____ Stop supplements until after surgery.     ____ Bring C-Pap to the hospital.

## 2019-09-28 NOTE — Pre-Procedure Instructions (Signed)
Cammie Sickle, MD  Physician  Specialty:  Oncology  Telephone Encounter  Signed  Encounter Date:  09/05/2019          Signed         Show:Clear all [x] Manual[] Template[] Copied  Added by: [x] Cammie Sickle, MD  [] Hover for details Status post myomectomy; 10 mg of Xarelto prophylaxis for DVT.  Noted to have heavy vaginal bleeding.  As per the recommendations from GYN-patient stopped taking Xarelto; last dose of Xarelto 10/11.  Recommend holding off further Xarelto given increased vaginal bleeding.  Recommend increased ambulation.  Patient had labs with gynecology; awaiting results.  Patient will call us to inform if significantly low for IV iron infusions.        Electronically signed by Cammie Sickle, MD at 09/05/2019 7:38 PM   Telephone on 09/05/2019     Detailed Report

## 2019-09-29 ENCOUNTER — Other Ambulatory Visit: Payer: Self-pay

## 2019-09-29 ENCOUNTER — Other Ambulatory Visit
Admission: RE | Admit: 2019-09-29 | Discharge: 2019-09-29 | Disposition: A | Discharge status: Home / Self Care | Payer: 59 | Source: Ambulatory Visit | Attending: Obstetrics and Gynecology | Admitting: Obstetrics and Gynecology

## 2019-09-29 ENCOUNTER — Telehealth: Payer: Self-pay | Admitting: Internal Medicine

## 2019-09-29 DIAGNOSIS — Z01812 Encounter for preprocedural laboratory examination: Secondary | ICD-10-CM | POA: Insufficient documentation

## 2019-09-29 DIAGNOSIS — Z20828 Contact with and (suspected) exposure to other viral communicable diseases: Secondary | ICD-10-CM | POA: Diagnosis not present

## 2019-09-29 LAB — TYPE AND SCREEN
ABO/RH(D): A NEG
Antibody Screen: NEGATIVE

## 2019-09-29 LAB — SARS CORONAVIRUS 2 (TAT 6-24 HRS): SARS Coronavirus 2: NEGATIVE

## 2019-09-29 NOTE — Telephone Encounter (Signed)
I Left a message for the patient to review the results of her hypercoagulable work-up.  Will also discuss with Dr.Stepen.

## 2019-10-02 ENCOUNTER — Telehealth: Payer: Self-pay | Admitting: Internal Medicine

## 2019-10-02 NOTE — Telephone Encounter (Signed)
x

## 2019-10-03 ENCOUNTER — Ambulatory Visit: Payer: 59 | Admitting: Anesthesiology

## 2019-10-03 ENCOUNTER — Telehealth: Payer: Self-pay | Admitting: Internal Medicine

## 2019-10-03 ENCOUNTER — Encounter: Payer: Self-pay | Admitting: *Deleted

## 2019-10-03 ENCOUNTER — Encounter: Payer: Self-pay | Admitting: Internal Medicine

## 2019-10-03 ENCOUNTER — Ambulatory Visit
Admission: RE | Admit: 2019-10-03 | Discharge: 2019-10-03 | Disposition: A | Discharge status: Home / Self Care | Payer: 59 | Attending: Obstetrics and Gynecology | Admitting: Obstetrics and Gynecology

## 2019-10-03 ENCOUNTER — Encounter
Admission: RE | Disposition: A | Discharge status: Home / Self Care | Payer: Self-pay | Source: Home / Self Care | Attending: Obstetrics and Gynecology

## 2019-10-03 ENCOUNTER — Other Ambulatory Visit: Payer: Self-pay

## 2019-10-03 ENCOUNTER — Encounter: Payer: Self-pay | Admitting: Family Medicine

## 2019-10-03 DIAGNOSIS — Z7951 Long term (current) use of inhaled steroids: Secondary | ICD-10-CM | POA: Diagnosis not present

## 2019-10-03 DIAGNOSIS — Z79899 Other long term (current) drug therapy: Secondary | ICD-10-CM | POA: Diagnosis not present

## 2019-10-03 DIAGNOSIS — E282 Polycystic ovarian syndrome: Secondary | ICD-10-CM | POA: Insufficient documentation

## 2019-10-03 DIAGNOSIS — D251 Intramural leiomyoma of uterus: Secondary | ICD-10-CM

## 2019-10-03 DIAGNOSIS — D250 Submucous leiomyoma of uterus: Secondary | ICD-10-CM

## 2019-10-03 DIAGNOSIS — D500 Iron deficiency anemia secondary to blood loss (chronic): Secondary | ICD-10-CM | POA: Insufficient documentation

## 2019-10-03 DIAGNOSIS — Z86718 Personal history of other venous thrombosis and embolism: Secondary | ICD-10-CM | POA: Diagnosis not present

## 2019-10-03 DIAGNOSIS — D6851 Activated protein C resistance: Secondary | ICD-10-CM | POA: Insufficient documentation

## 2019-10-03 DIAGNOSIS — N800 Endometriosis of uterus: Secondary | ICD-10-CM

## 2019-10-03 DIAGNOSIS — J45909 Unspecified asthma, uncomplicated: Secondary | ICD-10-CM | POA: Diagnosis not present

## 2019-10-03 DIAGNOSIS — N946 Dysmenorrhea, unspecified: Secondary | ICD-10-CM

## 2019-10-03 DIAGNOSIS — N921 Excessive and frequent menstruation with irregular cycle: Secondary | ICD-10-CM | POA: Insufficient documentation

## 2019-10-03 DIAGNOSIS — J4520 Mild intermittent asthma, uncomplicated: Secondary | ICD-10-CM

## 2019-10-03 HISTORY — PX: HYSTEROSCOPY WITH D & C: SHX1775

## 2019-10-03 LAB — POCT PREGNANCY, URINE: Preg Test, Ur: NEGATIVE

## 2019-10-03 SURGERY — DILATATION AND CURETTAGE /HYSTEROSCOPY
Anesthesia: General

## 2019-10-03 MED ORDER — MEPERIDINE HCL 50 MG/ML IJ SOLN: 6.2500 mg | INTRAMUSCULAR | Status: DC | PRN

## 2019-10-03 MED ORDER — ONDANSETRON HCL 4 MG/2ML IJ SOLN
INTRAMUSCULAR | Status: AC
  Filled 2019-10-03: qty 2

## 2019-10-03 MED ORDER — LIDOCAINE HCL (CARDIAC) PF 100 MG/5ML IV SOSY
PREFILLED_SYRINGE | INTRAVENOUS | Status: DC | PRN
  Administered 2019-10-03: 50 mg via INTRAVENOUS

## 2019-10-03 MED ORDER — PROPOFOL 500 MG/50ML IV EMUL
INTRAVENOUS | Status: AC
  Filled 2019-10-03: qty 50

## 2019-10-03 MED ORDER — PROPOFOL 500 MG/50ML IV EMUL
INTRAVENOUS | Status: DC | PRN
  Administered 2019-10-03: 130 ug/kg/min via INTRAVENOUS

## 2019-10-03 MED ORDER — FAMOTIDINE 20 MG PO TABS
20.0000 mg | ORAL_TABLET | Freq: Once | ORAL | Status: AC
  Administered 2019-10-03: 20 mg via ORAL

## 2019-10-03 MED ORDER — DEXAMETHASONE SODIUM PHOSPHATE 10 MG/ML IJ SOLN
INTRAMUSCULAR | Status: DC | PRN
  Administered 2019-10-03: 10 mg via INTRAVENOUS

## 2019-10-03 MED ORDER — PROPOFOL 10 MG/ML IV BOLUS
INTRAVENOUS | Status: AC
  Filled 2019-10-03: qty 20

## 2019-10-03 MED ORDER — IBUPROFEN 600 MG PO TABS: 600.0000 mg | ORAL_TABLET | Freq: Four times a day (QID) | ORAL | 0 refills | Status: DC | PRN

## 2019-10-03 MED ORDER — FENTANYL CITRATE (PF) 100 MCG/2ML IJ SOLN
INTRAMUSCULAR | Status: AC
  Filled 2019-10-03: qty 2

## 2019-10-03 MED ORDER — OXYCODONE HCL 5 MG PO TABS: 5.0000 mg | ORAL_TABLET | Freq: Once | ORAL | Status: DC | PRN

## 2019-10-03 MED ORDER — FENTANYL CITRATE (PF) 100 MCG/2ML IJ SOLN: 25.0000 ug | INTRAMUSCULAR | Status: DC | PRN

## 2019-10-03 MED ORDER — LACTATED RINGERS IV SOLN
INTRAVENOUS | Status: DC
  Administered 2019-10-03: 09:00:00 via INTRAVENOUS

## 2019-10-03 MED ORDER — DEXMEDETOMIDINE HCL 200 MCG/2ML IV SOLN
INTRAVENOUS | Status: DC | PRN
  Administered 2019-10-03: 20 ug via INTRAVENOUS

## 2019-10-03 MED ORDER — KETOROLAC TROMETHAMINE 30 MG/ML IJ SOLN
INTRAMUSCULAR | Status: DC | PRN
  Administered 2019-10-03: 30 mg via INTRAVENOUS

## 2019-10-03 MED ORDER — FAMOTIDINE 20 MG PO TABS
ORAL_TABLET | ORAL | Status: AC
  Filled 2019-10-03: qty 1

## 2019-10-03 MED ORDER — LACTATED RINGERS IV BOLUS
400.0000 mL | Freq: Once | INTRAVENOUS | Status: AC
  Administered 2019-10-03: 400 mL via INTRAVENOUS

## 2019-10-03 MED ORDER — ONDANSETRON HCL 4 MG/2ML IJ SOLN
INTRAMUSCULAR | Status: DC | PRN
  Administered 2019-10-03: 4 mg via INTRAVENOUS

## 2019-10-03 MED ORDER — PROMETHAZINE HCL 25 MG/ML IJ SOLN
INTRAMUSCULAR | Status: AC
  Administered 2019-10-03: 6.25 mg via INTRAVENOUS
  Filled 2019-10-03: qty 1

## 2019-10-03 MED ORDER — ALBUTEROL SULFATE HFA 108 (90 BASE) MCG/ACT IN AERS: 2.0000 | INHALATION_SPRAY | RESPIRATORY_TRACT | 3 refills | Status: DC | PRN

## 2019-10-03 MED ORDER — OXYCODONE HCL 5 MG/5ML PO SOLN: 5.0000 mg | Freq: Once | ORAL | Status: DC | PRN

## 2019-10-03 MED ORDER — FENTANYL CITRATE (PF) 100 MCG/2ML IJ SOLN
INTRAMUSCULAR | Status: DC | PRN
  Administered 2019-10-03: 50 ug via INTRAVENOUS
  Administered 2019-10-03: 100 ug via INTRAVENOUS
  Administered 2019-10-03: 50 ug via INTRAVENOUS

## 2019-10-03 MED ORDER — SUCCINYLCHOLINE CHLORIDE 20 MG/ML IJ SOLN
INTRAMUSCULAR | Status: DC | PRN
  Administered 2019-10-03: 100 mg via INTRAVENOUS

## 2019-10-03 MED ORDER — PROMETHAZINE HCL 25 MG/ML IJ SOLN
6.2500 mg | INTRAMUSCULAR | Status: DC | PRN
  Administered 2019-10-03: 11:00:00 6.25 mg via INTRAVENOUS

## 2019-10-03 MED ORDER — KETOROLAC TROMETHAMINE 30 MG/ML IJ SOLN
INTRAMUSCULAR | Status: AC
  Filled 2019-10-03: qty 1

## 2019-10-03 MED ORDER — PROPOFOL 10 MG/ML IV BOLUS
INTRAVENOUS | Status: DC | PRN
  Administered 2019-10-03: 50 mg via INTRAVENOUS
  Administered 2019-10-03: 200 mg via INTRAVENOUS

## 2019-10-03 MED ORDER — SILVER NITRATE-POT NITRATE 75-25 % EX MISC
CUTANEOUS | Status: DC | PRN
  Administered 2019-10-03: 2

## 2019-10-03 MED ORDER — DEXAMETHASONE SODIUM PHOSPHATE 10 MG/ML IJ SOLN
INTRAMUSCULAR | Status: AC
  Filled 2019-10-03: qty 1

## 2019-10-03 MED ORDER — LACTATED RINGERS IV SOLN
INTRAVENOUS | Status: DC
  Administered 2019-10-03: 08:00:00 via INTRAVENOUS

## 2019-10-03 MED ORDER — HYDROCODONE-ACETAMINOPHEN 5-325 MG PO TABS: 1.0000 | ORAL_TABLET | Freq: Three times a day (TID) | ORAL | 0 refills | Status: DC | PRN

## 2019-10-03 MED ORDER — MIDAZOLAM HCL 2 MG/2ML IJ SOLN
INTRAMUSCULAR | Status: AC
  Filled 2019-10-03: qty 2

## 2019-10-03 MED ORDER — SODIUM CHLORIDE FLUSH 0.9 % IV SOLN
INTRAVENOUS | Status: AC
  Filled 2019-10-03: qty 10

## 2019-10-03 MED ORDER — MIDAZOLAM HCL 2 MG/2ML IJ SOLN
INTRAMUSCULAR | Status: DC | PRN
  Administered 2019-10-03: 2 mg via INTRAVENOUS

## 2019-10-03 SURGICAL SUPPLY — 27 items
BAG URINE DRAIN 2000ML AR STRL (UROLOGICAL SUPPLIES) IMPLANT
CANISTER SUCT 3000ML PPV (MISCELLANEOUS) ×2 IMPLANT
CATH FOLEY 2WAY  5CC 16FR (CATHETERS)
CATH ROBINSON RED A/P 16FR (CATHETERS) ×2 IMPLANT
CATH URTH 16FR FL 2W BLN LF (CATHETERS) IMPLANT
COVER WAND RF STERILE (DRAPES) ×2 IMPLANT
DEVICE MYOSURE LITE (MISCELLANEOUS) IMPLANT
ELECT REM PT RETURN 9FT ADLT (ELECTROSURGICAL) ×2
ELECTRODE REM PT RTRN 9FT ADLT (ELECTROSURGICAL) ×1 IMPLANT
ENDOLOOP SUT PDS II  0 18 (SUTURE) ×1
ENDOLOOP SUT PDS II 0 18 (SUTURE) ×1 IMPLANT
GLOVE BIO SURGEON STRL SZ7 (GLOVE) ×2 IMPLANT
GLOVE BIOGEL PI IND STRL 7.5 (GLOVE) ×1 IMPLANT
GLOVE BIOGEL PI INDICATOR 7.5 (GLOVE) ×1
GOWN STRL REUS W/ TWL LRG LVL3 (GOWN DISPOSABLE) ×2 IMPLANT
GOWN STRL REUS W/TWL LRG LVL3 (GOWN DISPOSABLE) ×2
IV LACTATED RINGERS 1000ML (IV SOLUTION) IMPLANT
IV NS IRRIG 3000ML ARTHROMATIC (IV SOLUTION) ×6 IMPLANT
KIT PROCEDURE FLUENT (KITS) ×2 IMPLANT
KIT TURNOVER CYSTO (KITS) ×2 IMPLANT
MYOSURE XL FIBROID (MISCELLANEOUS) ×2
PACK DNC HYST (MISCELLANEOUS) ×2 IMPLANT
PAD OB MATERNITY 4.3X12.25 (PERSONAL CARE ITEMS) ×2 IMPLANT
PAD PREP 24X41 OB/GYN DISP (PERSONAL CARE ITEMS) ×2 IMPLANT
SEAL ROD LENS SCOPE MYOSURE (ABLATOR) ×2 IMPLANT
SYSTEM TISS REMOVAL MYOSURE XL (MISCELLANEOUS) ×1 IMPLANT
TUBING CONNECTING 10 (TUBING) ×2 IMPLANT

## 2019-10-03 NOTE — Anesthesia Postprocedure Evaluation (Signed)
Anesthesia Post Note  Patient: Tara Shea  Procedure(s) Performed: DILATATION AND CURETTAGE /HYSTEROSCOPY/ SUBMUCOSAL MYOMECTOMY (N/A )  Patient location during evaluation: PACU Anesthesia Type: General Level of consciousness: awake and alert and oriented Pain management: pain level controlled Vital Signs Assessment: post-procedure vital signs reviewed and stable Respiratory status: spontaneous breathing, nonlabored ventilation and respiratory function stable Cardiovascular status: blood pressure returned to baseline and stable Postop Assessment: no signs of nausea or vomiting Anesthetic complications: no     Last Vitals:  Vitals:   10/03/19 1125 10/03/19 1136  BP: 100/65   Pulse: 62 65  Resp: 20 18  Temp: (!) 36.3 C (!) 36.3 C  SpO2: 100% 100%    Last Pain:  Vitals:   10/03/19 1136  TempSrc: Temporal  PainSc: 4                  Kailly Richoux

## 2019-10-03 NOTE — Anesthesia Preprocedure Evaluation (Signed)
Anesthesia Evaluation  Patient identified by MRN, date of birth, ID band Patient awake    Reviewed: Allergy & Precautions, NPO status , Patient's Chart, lab work & pertinent test results  History of Anesthesia Complications (+) PONV and history of anesthetic complications  Airway Mallampati: II  TM Distance: >3 FB Neck ROM: Full    Dental no notable dental hx.    Pulmonary asthma , neg sleep apnea, neg COPD,    breath sounds clear to auscultation- rhonchi (-) wheezing      Cardiovascular Exercise Tolerance: Good (-) hypertension(-) CAD, (-) Past MI, (-) Cardiac Stents and (-) CABG  Rhythm:Regular Rate:Normal - Systolic murmurs and - Diastolic murmurs    Neuro/Psych  Headaches, neg Seizures negative psych ROS   GI/Hepatic negative GI ROS, Neg liver ROS,   Endo/Other  negative endocrine ROSneg diabetes  Renal/GU Renal disease: hx of nephrolithiasis.     Musculoskeletal negative musculoskeletal ROS (+)   Abdominal (+) + obese,   Peds  Hematology  (+) anemia ,   Anesthesia Other Findings Past Medical History: No date: Asthma No date: Bone spur 03/2019: DVT (deep venous thrombosis) (HCC)     Comment:  x 1 No date: Factor 5 Leiden mutation, heterozygous ([place])     Comment:  stopped Xarelto 08/23/19 No date: Fibroid No date: History of kidney stones No date: IDA (iron deficiency anemia)     Comment:  vitamin b12 and iron deficiency No date: Migraines No date: PCOS (polycystic ovarian syndrome)   Reproductive/Obstetrics                             Anesthesia Physical Anesthesia Plan  ASA: II  Anesthesia Plan: General   Post-op Pain Management:    Induction: Intravenous  PONV Risk Score and Plan: 3 and Ondansetron, Dexamethasone, Propofol infusion, TIVA and Midazolam  Airway Management Planned: Oral ETT  Additional Equipment:   Intra-op Plan:   Post-operative Plan: Extubation  in OR  Informed Consent: I have reviewed the patients History and Physical, chart, labs and discussed the procedure including the risks, benefits and alternatives for the proposed anesthesia with the patient or authorized representative who has indicated his/her understanding and acceptance.     Dental advisory given  Plan Discussed with: CRNA and Anesthesiologist  Anesthesia Plan Comments:         Anesthesia Quick Evaluation

## 2019-10-03 NOTE — Transfer of Care (Signed)
Immediate Anesthesia Transfer of Care Note  Patient: Tara Shea  Procedure(s) Performed: DILATATION AND CURETTAGE /HYSTEROSCOPY/ SUBMUCOSAL MYOMECTOMY (N/A )  Patient Location: PACU  Anesthesia Type:General  Level of Consciousness: drowsy and patient cooperative  Airway & Oxygen Therapy: Patient Spontanous Breathing and Patient connected to face mask oxygen  Post-op Assessment: Report given to RN and Post -op Vital signs reviewed and stable  Post vital signs: Reviewed and stable  Last Vitals:  Vitals Value Taken Time  BP 95/69 10/03/19 1022  Temp    Pulse 74 10/03/19 1023  Resp 19 10/03/19 1023  SpO2 100 % 10/03/19 1023  Vitals shown include unvalidated device data.  Last Pain:  Vitals:   10/03/19 0754  TempSrc: Temporal  PainSc: 0-No pain         Complications: No apparent anesthesia complications

## 2019-10-03 NOTE — Anesthesia Post-op Follow-up Note (Signed)
Anesthesia QCDR form completed.        

## 2019-10-03 NOTE — Progress Notes (Signed)
Patient pre screened for office appointment, no questions or concerns today. Patient reminded of upcoming appointment time and date. Patient had surgery today.

## 2019-10-03 NOTE — H&P (Signed)
Preoperative History and Physical  Tara Shea is a 38 y.o. G0P0000 here for surgical management of menorrhagia and submucosal fibroid.     History of Present Illness: 38 y.o. G0P0000 female who presents for second surgery to remove submucosal fibroids.   She underwent a hysteroscopy, dilation and curettage, submucosal myomectomy on 08/31/2019, during which only part of the large number of fibroids were removed.  The procedure was terminated due to reaching the maximum fluid deficit.  On her workup for the issues, she was noted to have a mass noted in her uterus on CT scan during a previous hospitalization. A pelvic ultrasound noted a heterogeneous, predominantly solid mass within the endometrium, measuring 5.3 cm in size with internal blood flow noted on doppler interrogation. She states she underwent a myomectomy 9 years ago, but she states that she was told there was one remaining in themyometrium. In May this year she was diagnosed with another DVT and was started on Xarelto. Her first DVT was considered provoked. Her most recent DVT was considered unprovoked. She is not taking anticoagulation currently.  Her recent thrombophilia workup was positive only for Factor V Leiden heterozygosity. Her menses are about 7-10 days in length and heavy. She was changing the largest tampon and pad each half hour. In the past, she would need to change a pad or tampon (ultra size) every 1.5-2 hours. She had only been using condoms for birth control. She received an iron infusion due to heavy menses recently. Nine years ago she was on warfarin and the same thing happened in terms of heavy bleeding. During her last surgery multiple submucosal fibroids were noted.  The largest two on her left side were removed and part of the largest one on her right side was removed during her last surgery.  She is intent on having all submucosal fibroids in order for her to achieve pregnancy.   She has no symptoms of  cancer and her pathology from her prior surgery was benign.   She has undergone a saline infusion sonohysterogram that confirmed the fibroid. The measurement of the fibroid at that time was 8 x 5.3 x 5.5 cm with a mostly submucosal location.     Proposed surgery: Hysteroscopy, dilation and curettage, submucosal myomectomy  Past Medical History:  Diagnosis Date  . Asthma   . Bone spur   . DVT (deep venous thrombosis) ([place]) 03/2019   x 1  . Factor 5 Leiden mutation, heterozygous ([place])    stopped Xarelto 08/23/19  . Fibroid   . History of kidney stones   . IDA (iron deficiency anemia)    vitamin b12 and iron deficiency  . Migraines   . PCOS (polycystic ovarian syndrome)    Past Surgical History:  Procedure Laterality Date  . CHOLECYSTECTOMY N/A 07/28/2019   Procedure: LAPAROSCOPIC CHOLECYSTECTOMY;  Surgeon: Jules Husbands, MD;  Location: [facility] ORS;  Service: General;  Laterality: N/A;  . HYSTEROSCOPY W/D&C N/A 08/31/2019   Procedure: DILATATION AND CURETTAGE /HYSTEROSCOPY/ SUBMUCOSAL MYOMECTOMY;  Surgeon: Will Bonnet, MD;  Location: [facility] ORS;  Service: Gynecology;  Laterality: N/A;  . MYOMECTOMY    . TONSILLECTOMY    . WISDOM TOOTH EXTRACTION    . WRIST SURGERY     age 72 - rebroke it b/c it grew back wrong   OB History  Gravida Para Term Preterm AB Living  0 0 0 0 0 0  SAB TAB Ectopic Multiple Live Births  0 0 0 0 0  Patient denies  any other pertinent gynecologic issues.   No current facility-administered medications on file prior to encounter.    Current Outpatient Medications on File Prior to Encounter  Medication Sig Dispense Refill  . albuterol (PROVENTIL HFA;VENTOLIN HFA) 108 (90 Base) MCG/ACT inhaler Inhale 2 puffs into the lungs every 4 (four) hours as needed for wheezing or shortness of breath. 1 Inhaler 2  . albuterol (PROVENTIL) (2.5 MG/3ML) 0.083% nebulizer solution Take 3 mLs (2.5 mg total) by nebulization every 6 (six) hours as needed for wheezing or  shortness of breath. 75 mL 12  . ferrous sulfate 325 (65 FE) MG tablet Take 325 mg by mouth daily.    . fluticasone (FLOVENT HFA) 220 MCG/ACT inhaler Inhale 2 puffs into the lungs 2 (two) times daily. 1 Inhaler 12  . Multiple Vitamins-Minerals (MULTIVITAMIN WITH MINERALS) tablet Take 1 tablet by mouth daily.     . ondansetron (ZOFRAN ODT) 4 MG disintegrating tablet Take 1 tablet (4 mg total) by mouth every 6 (six) hours as needed for nausea. 20 tablet 0  . promethazine (PHENERGAN) 25 MG tablet Take 1 tablet (25 mg total) by mouth every 8 (eight) hours as needed for nausea or vomiting. 30 tablet 0  . Spacer/Aero-Holding Chambers (AEROCHAMBER PLUS) inhaler Use as instructed 1 each 2  . vitamin B-12 (CYANOCOBALAMIN) 1000 MCG tablet Take 1 tablet (1,000 mcg total) by mouth daily. 30 tablet 0  . vitamin C (ASCORBIC ACID) 500 MG tablet Take 500 mg by mouth daily.    Marland Kitchen ibuprofen (ADVIL) 600 MG tablet Take 1 tablet (600 mg total) by mouth every 6 (six) hours as needed for mild pain or cramping. (Patient not taking: Reported on 09/20/2019) 30 tablet 0   Allergies: No Known Allergies  Social History:   reports that she has never smoked. She has never used smokeless tobacco. She reports current alcohol use. She reports that she does not use drugs.  Family History  Problem Relation Age of Onset  . Heart murmur Mother   . Thyroid disease Mother   . Migraines Mother   . Asthma Mother   . Diabetes Mother        TYPE 2  . Bipolar disorder Mother   . Breast cancer Maternal Aunt 56  . Cancer Maternal Uncle        skin  . Cancer Maternal Grandmother 95       colon  . Hypertension Maternal Grandmother   . Cancer Maternal Aunt 50       colon  . Diabetes Maternal Aunt     Review of Systems: Noncontributory  PHYSICAL EXAM: Blood pressure 118/89, pulse 85, temperature 98.1 F (36.7 C), temperature source Temporal, resp. rate 20, height 5\' 8"  (1.727 m), weight (!) 143.8 kg, SpO2 100 %. CONSTITUTIONAL:  Well-developed, well-nourished female in no acute distress.  HENT:  Normocephalic, atraumatic, External right and left ear normal. Oropharynx is clear and moist EYES: Conjunctivae and EOM are normal. Pupils are equal, round, and reactive to light. No scleral icterus.  NECK: Normal range of motion, supple, no masses SKIN: Skin is warm and dry. No rash noted. Not diaphoretic. No erythema. No pallor. [place]: Alert and oriented to person, place, and time. Normal reflexes, muscle tone coordination. No cranial nerve deficit noted. PSYCHIATRIC: Normal mood and affect. Normal behavior. Normal judgment and thought content. CARDIOVASCULAR: Normal heart rate noted, regular rhythm RESPIRATORY: Effort and breath sounds normal, no problems with respiration noted ABDOMEN: Soft, nontender, nondistended. PELVIC: Deferred MUSCULOSKELETAL: Normal range of  motion. No edema and no tenderness. 2+ distal pulses.  Labs: Results for orders placed or performed during the hospital encounter of 10/03/19 (from the past 336 hour(s))  Pregnancy, urine POC   Collection Time: 10/03/19  8:19 AM  Result Value Ref Range   Preg Test, Ur NEGATIVE NEGATIVE  Results for orders placed or performed during the hospital encounter of 09/29/19 (from the past 336 hour(s))  SARS CORONAVIRUS 2 (TAT 6-24 HRS) Nasopharyngeal Nasopharyngeal Swab   Collection Time: 09/29/19  8:20 AM   Specimen: Nasopharyngeal Swab  Result Value Ref Range   SARS Coronavirus 2 NEGATIVE NEGATIVE  Type and screen [place]   Collection Time: 09/29/19  8:20 AM  Result Value Ref Range   ABO/RH(D) A NEG    Antibody Screen NEG    Sample Expiration 10/13/2019,2359    Extend sample reason      NO TRANSFUSIONS OR PREGNANCY IN THE PAST 3 MONTHS Performed at [facility], [address] [place], [place] [address]   Results for orders placed or performed in visit on 09/20/19 (from the past 336 hour(s))  Lactate dehydrogenase    Collection Time: 09/20/19  1:09 PM  Result Value Ref Range   LDH 125 98 - 192 U/L  Prothrombin gene mutation   Collection Time: 09/20/19  1:09 PM  Result Value Ref Range   Recommendations-PTGENE: Comment   Protein S activity   Collection Time: 09/20/19  1:09 PM  Result Value Ref Range   Protein S Activity 83 63 - 140 %  Protein S, total   Collection Time: 09/20/19  1:09 PM  Result Value Ref Range   Protein S Ag, Total 111 60 - 150 %  Protein C activity   Collection Time: 09/20/19  1:09 PM  Result Value Ref Range   Protein C Activity 125 73 - 180 %  Protein C, total   Collection Time: 09/20/19  1:09 PM  Result Value Ref Range   Protein C, Total 93 60 - 150 %  Antithrombin III   Collection Time: 09/20/19  1:09 PM  Result Value Ref Range   AntiThromb III Func 94 75 - 120 %  Factor 5 leiden   Collection Time: 09/20/19  1:09 PM  Result Value Ref Range   Recommendations-F5LEID: Comment (A)   Cardiolipin antibodies, IgG, IgM, IgA   Collection Time: 09/20/19  1:09 PM  Result Value Ref Range   Anticardiolipin IgG <9 0 - 14 GPL U/mL   Anticardiolipin IgM <9 0 - 12 MPL U/mL   Anticardiolipin IgA <9 0 - 11 APL U/mL  Beta-2-glycoprotein i abs, IgG/M/A   Collection Time: 09/20/19  1:09 PM  Result Value Ref Range   Beta-2 Glyco I IgG <9 0 - 20 GPI IgG units   Beta-2-Glycoprotein I IgM <9 0 - 32 GPI IgM units   Beta-2-Glycoprotein I IgA <9 0 - 25 GPI IgA units  Lupus anticoagulant   Collection Time: 09/20/19  1:09 PM  Result Value Ref Range   dPT 30.2 0.0 - 55.0 sec   dPT Confirm Ratio 0.95 0.00 - 1.40 Ratio   Thrombin Time 15.8 0.0 - 23.0 sec   PTT Lupus Anticoagulant 30.1 0.0 - 51.9 sec   DRVVT 25.7 0.0 - 47.0 sec   Lupus Anticoag Interp Comment:     Assessment: Patient Active Problem List   Diagnosis Date Noted  . Intramural and submucous leiomyoma of uterus 08/31/2019  . Menorrhagia with irregular cycle 08/31/2019  .  Dysmenorrhea 08/31/2019   Plan: Patient will  undergo surgical management with the above noted surgery.   The risks of surgery were discussed in detail with the patient including but not limited to: bleeding which may require transfusion or reoperation; infection which may require antibiotics; injury to surrounding organs which may involve bowel, bladder, ureters ; need for additional procedures including laparoscopy or laparotomy; thromboembolic phenomenon, surgical site problems and other postoperative/anesthesia complications. Likelihood of success in alleviating the patient's condition was discussed. Routine postoperative instructions will be reviewed with the patient and her family in detail after surgery.  The patient concurred with the proposed plan, giving informed written consent for the surgery.  Patient has been NPO since last night she will remain NPO for procedure.  Anesthesia and OR aware.  Preoperative prophylactic antibiotics, as indicated, and SCDs ordered on call to the OR.  To OR when ready.  Prentice Docker, MD 10/03/2019 8:41 AM

## 2019-10-03 NOTE — Anesthesia Procedure Notes (Signed)
Procedure Name: Intubation Date/Time: 10/03/2019 8:55 AM Performed by: Jonna Clark, CRNA Pre-anesthesia Checklist: Patient identified, Patient being monitored, Timeout performed, Emergency Drugs available and Suction available Patient Re-evaluated:Patient Re-evaluated prior to induction Oxygen Delivery Method: Circle system utilized Preoxygenation: Pre-oxygenation with 100% oxygen Induction Type: IV induction Ventilation: Mask ventilation without difficulty Laryngoscope Size: 4 and McGraph Grade View: Grade I Tube type: Oral Tube size: 7.0 mm Number of attempts: 1 Airway Equipment and Method: Stylet Placement Confirmation: ETT inserted through vocal cords under direct vision,  positive ETCO2 and breath sounds checked- equal and bilateral Secured at: 22 cm Tube secured with: Tape Dental Injury: Teeth and Oropharynx as per pre-operative assessment

## 2019-10-03 NOTE — Telephone Encounter (Signed)
Left a message for patient to discuss the results of her hypercoagulable work-up.  Recommend call us back

## 2019-10-03 NOTE — Op Note (Signed)
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  CONDITION: stable  INDICATION: 38 y.o. G0P0000 female with a history of severe menorrhagia with irregular cycle and resultant anemia diagnosed with multiple submucosal fibroids.  She presents for a second attempt to remove submucosal fibroids as she desires to have children.    FINDINGS: Exam under anesthesia revealed mobile anteverted uterus and bilateral adnexa without masses or fullness. Hysteroscopy revealed a multiple fibroids impinging and distorting the uterine cavity.  The largest on the right lateral sidewall about mid-uterus.  The cavity appears enlarged.   PROCEDURE IN DETAIL:  After informed consent was obtained, the patient was taken to the operating room where anesthesia was obtained without difficulty. The patient was positioned in the dorsal lithotomy position in [place].  The patient's bladder was catheterized with an in and out foley catheter with minimal return.  The patient was examined under anesthesia, with the above noted findings.  The bi-valved speculum was placed inside the patient's vagina, and the the anterior lip of the cervix was seen and grasped with the tenaculum.   The cervix was progressively dilated to a 7 mm Hegar dilator, though the cervix was generally already at this dilation or greater.  The hysteroscope was introduced, with the above noted findings.  The MyoSure XL device was attempted to be utilized. However, there was too much leakage of fluid from the cervix around the scope and tenaculum.  The tenaculum was adjusted to try to decrease leakage.  This was unsuccessful.  An Endoloop was utilized to cinch the cervix around the scope, which decreased the loss of fluid from the cervix.  Care was taken not to the vascularized the cervix during this portion of the procedure.  Attention was turned to the right lateral sidewall.  Using the MyoSure XL device the fibroid was transected along the uterine wall and the fibroid released from the uterine wall.  At this point the fluid deficit was quickly climbing and there was not enough time to completely remove the free-floating fibroid.  Attempt was made to try to remove a fundal fibroid.  However the fluid deficit was too high at this point to continue the procedure.  The hysteroscope was removed from the cervix.  The Endoloop was removed from the cervix.  The cervix was noted to be well vascularized with good coloring indicating good blood flow after the removal of the Endoloop.  After removal of the tenaculum, there was a small amount of oozing from the tenaculum entry sites.  After allowing several minutes for complete blood flow to return to the distal cervix silver nitrate was utilized to stop all bleeding at the tenaculum entry sites.  A full 10 minutes was spent watching the bleeding from the cervix and at the end of the case  there had been at least 8 minutes with no further bleeding.  Given the fluid deficit was at its maximum, the procedure was forced to be terminated at this point.  The vagina was inspected and found to be clear of any instruments and sponges.  The speculum was removed.  The patient tolerated  the procedure well.  Sponge, lap and needle counts were correct x2.  The patient was taken to recovery room in excellent condition.   Will Bonnet, MD, [place] 10/03/2019 10:19 AM

## 2019-10-04 ENCOUNTER — Inpatient Hospital Stay: Payer: 59 | Attending: Internal Medicine | Admitting: Internal Medicine

## 2019-10-04 ENCOUNTER — Other Ambulatory Visit: Payer: Self-pay

## 2019-10-04 ENCOUNTER — Encounter: Payer: Self-pay | Admitting: Obstetrics and Gynecology

## 2019-10-04 ENCOUNTER — Inpatient Hospital Stay: Payer: 59

## 2019-10-04 DIAGNOSIS — Z86718 Personal history of other venous thrombosis and embolism: Secondary | ICD-10-CM | POA: Insufficient documentation

## 2019-10-04 DIAGNOSIS — I82461 Acute embolism and thrombosis of right calf muscular vein: Secondary | ICD-10-CM

## 2019-10-04 DIAGNOSIS — D509 Iron deficiency anemia, unspecified: Secondary | ICD-10-CM | POA: Insufficient documentation

## 2019-10-04 DIAGNOSIS — Z7951 Long term (current) use of inhaled steroids: Secondary | ICD-10-CM | POA: Insufficient documentation

## 2019-10-04 DIAGNOSIS — D6851 Activated protein C resistance: Secondary | ICD-10-CM | POA: Diagnosis not present

## 2019-10-04 DIAGNOSIS — Z800 Family history of malignant neoplasm of digestive organs: Secondary | ICD-10-CM | POA: Diagnosis not present

## 2019-10-04 DIAGNOSIS — Z791 Long term (current) use of non-steroidal anti-inflammatories (NSAID): Secondary | ICD-10-CM | POA: Diagnosis not present

## 2019-10-04 DIAGNOSIS — Z79899 Other long term (current) drug therapy: Secondary | ICD-10-CM | POA: Diagnosis not present

## 2019-10-04 DIAGNOSIS — D259 Leiomyoma of uterus, unspecified: Secondary | ICD-10-CM | POA: Insufficient documentation

## 2019-10-04 DIAGNOSIS — J45909 Unspecified asthma, uncomplicated: Secondary | ICD-10-CM | POA: Insufficient documentation

## 2019-10-04 DIAGNOSIS — Z86711 Personal history of pulmonary embolism: Secondary | ICD-10-CM | POA: Insufficient documentation

## 2019-10-04 DIAGNOSIS — Z803 Family history of malignant neoplasm of breast: Secondary | ICD-10-CM | POA: Diagnosis not present

## 2019-10-04 DIAGNOSIS — Z8249 Family history of ischemic heart disease and other diseases of the circulatory system: Secondary | ICD-10-CM | POA: Diagnosis not present

## 2019-10-04 DIAGNOSIS — Z7901 Long term (current) use of anticoagulants: Secondary | ICD-10-CM | POA: Insufficient documentation

## 2019-10-04 LAB — SURGICAL PATHOLOGY

## 2019-10-04 NOTE — Progress Notes (Signed)
[name] CONSULT NOTE  Patient Care Team: Hubbard Hartshorn, FNP as PCP - General (Family Medicine)  CHIEF COMPLAINTS/PURPOSE OF CONSULTATION: DVT/PE  # provoked DVT in 2011 [lesser saphenous vein left lower extremity/below calf; BCP/long car ride x 6 months anticoagulation]; Apr 07, 2019-right lower extremity DVT nonocclusive-Xarelto x34m; October 2020-factor V Leiden heterozygous.   #Hypercoagulable state-2011?  Factor V Leiden heterozygosity [as per patient; [facility]]-no records  #June 2020-anemia likely iron deficiency/menstrual periods.  Oncology History   No history exists.    HISTORY OF PRESENTING ILLNESS:  Tara Shea 38 y.o.  female history of unprovoked DVT of the right lower extremity/calf DVT currently on surveillance [off xarelto- given surgery]  is here for follow-up/review the results of her hypercoagulable care.  Patient is currently undergoing stage fibroid resection with Dr. Prentice Docker.  Patient was started on Xarelto 10 mg 2 days post surgery noted to have significant worsening bleeding.  Hemoglobin was stable; patient had dizziness-which was attributable to her ear infection.  Patient had repeat myomectomy procedure yesterday.  She is little fatigued from the general anesthesia.  She otherwise denies any unusual shortness of breath or cough.  No swelling in the legs.  Review of Systems  Constitutional: Positive for malaise/fatigue. Negative for chills, diaphoresis, fever and weight loss.  HENT: Negative for nosebleeds and sore throat.   Eyes: Negative for double vision.  Respiratory: Negative for cough, hemoptysis, sputum production, shortness of breath and wheezing.   Cardiovascular: Negative for chest pain, palpitations, orthopnea and leg swelling.  Gastrointestinal: Negative for abdominal pain, blood in stool, constipation, diarrhea, heartburn, melena, nausea and vomiting.  Genitourinary: Negative for dysuria, frequency and  urgency.  Musculoskeletal: Negative for back pain and joint pain.  Skin: Negative.  Negative for itching and rash.  Neurological: Negative for dizziness, tingling, focal weakness, weakness and headaches.  Endo/Heme/Allergies: Does not bruise/bleed easily.  Psychiatric/Behavioral: Negative for depression. The patient is not nervous/anxious and does not have insomnia.      MEDICAL HISTORY:  Past Medical History:  Diagnosis Date  . Asthma   . Bone spur   . DVT (deep venous thrombosis) ([place]) 03/2019   x 1  . Factor 5 Leiden mutation, heterozygous ([place])    stopped Xarelto 08/23/19  . Fibroid   . History of kidney stones   . IDA (iron deficiency anemia)    vitamin b12 and iron deficiency  . Migraines   . PCOS (polycystic ovarian syndrome)     SURGICAL HISTORY: Past Surgical History:  Procedure Laterality Date  . CHOLECYSTECTOMY N/A 07/28/2019   Procedure: LAPAROSCOPIC CHOLECYSTECTOMY;  Surgeon: Jules Husbands, MD;  Location: [facility] ORS;  Service: General;  Laterality: N/A;  . HYSTEROSCOPY W/D&C N/A 08/31/2019   Procedure: DILATATION AND CURETTAGE /HYSTEROSCOPY/ SUBMUCOSAL MYOMECTOMY;  Surgeon: Will Bonnet, MD;  Location: [facility] ORS;  Service: Gynecology;  Laterality: N/A;  . HYSTEROSCOPY W/D&C N/A 10/03/2019   Procedure: DILATATION AND CURETTAGE /HYSTEROSCOPY/ SUBMUCOSAL MYOMECTOMY;  Surgeon: Will Bonnet, MD;  Location: [facility] ORS;  Service: Gynecology;  Laterality: N/A;  . MYOMECTOMY    . TONSILLECTOMY    . WISDOM TOOTH EXTRACTION    . WRIST SURGERY     age 79 - rebroke it b/c it grew back wrong    SOCIAL HISTORY: Social History   Socioeconomic History  . Marital status: Married    Spouse name: adam  . Number of children: 0  . Years of education: 75  . Highest education level: Not  on file  Occupational History  . 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 Financial resource strain: Not hard at all  . Food insecurity     Worry: Never true    Inability: Never true  . Transportation needs    Medical: No    Non-medical: No  Tobacco Use  . Smoking status: Never Smoker  . Smokeless tobacco: Never Used  Substance and Sexual Activity  . Alcohol use: Yes    Comment: occ  . Drug use: No  . Sexual activity: Yes    Birth control/protection: Condom  Lifestyle  . Physical activity    Days per week: 0 days    Minutes per session: Not on file  . Stress: Not at all  Relationships  . Social connections    Talks on phone: More than three times a week    Gets together: Once a week    Attends religious service: 1 to 4 times per year    Active member of club or organization: No    Attends meetings of clubs or organizations: Never    Relationship status: Married  . Intimate partner violence    Fear of current or ex partner: No    Emotionally abused: No    Physically abused: No    Forced sexual activity: No  Other Topics Concern  . 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  No children.        FAMILY HISTORY: Family History  Problem Relation Age of Onset  . Heart murmur Mother   . Thyroid disease Mother   . Migraines Mother   . Asthma Mother   . Diabetes Mother        TYPE 2  . Bipolar disorder Mother   . Breast cancer Maternal Aunt 16  . Cancer Maternal Uncle        skin  . Cancer Maternal Grandmother 70       colon  . Hypertension Maternal Grandmother   . Cancer Maternal Aunt 50       colon  . Diabetes Maternal Aunt     ALLERGIES:  has No Known Allergies.  MEDICATIONS:  Current Outpatient Medications  Medication Sig Dispense Refill  . albuterol (PROVENTIL) (2.5 MG/3ML) 0.083% nebulizer solution Take 3 mLs (2.5 mg total) by nebulization every 6 (six) hours as needed for wheezing or shortness of breath. 75 mL 12  . albuterol (VENTOLIN HFA) 108 (90 Base) MCG/ACT inhaler Inhale 2 puffs into the lungs every 4 (four) hours as needed for  wheezing or shortness of breath. 18 g 3  . ferrous sulfate 325 (65 FE) MG tablet Take 325 mg by mouth daily.    . fluticasone (FLOVENT HFA) 220 MCG/ACT inhaler Inhale 2 puffs into the lungs 2 (two) times daily. 1 Inhaler 12  . HYDROcodone-acetaminophen (NORCO) 5-325 MG tablet Take 1 tablet by mouth every 8 (eight) hours as needed (breakthrough pain). 8 tablet 0  . ibuprofen (ADVIL) 600 MG tablet Take 1 tablet (600 mg total) by mouth every 6 (six) hours as needed for mild pain or cramping. 30 tablet 0  . Multiple Vitamins-Minerals (MULTIVITAMIN WITH MINERALS) tablet Take 1 tablet by mouth daily.     . ondansetron (ZOFRAN ODT) 4 MG disintegrating tablet Take 1 tablet (4 mg total) by mouth every 6 (six) hours as needed for  nausea. 20 tablet 0  . promethazine (PHENERGAN) 25 MG tablet Take 1 tablet (25 mg total) by mouth every 8 (eight) hours as needed for nausea or vomiting. 30 tablet 0  . Spacer/Aero-Holding Chambers (AEROCHAMBER PLUS) inhaler Use as instructed 1 each 2  . vitamin B-12 (CYANOCOBALAMIN) 1000 MCG tablet Take 1 tablet (1,000 mcg total) by mouth daily. 30 tablet 0  . vitamin C (ASCORBIC ACID) 500 MG tablet Take 500 mg by mouth daily.     No current facility-administered medications for this visit.       Marland Kitchen  PHYSICAL EXAMINATION:  Vitals:   10/04/19 1309  BP: 126/87  Pulse: 76  Temp: (!) 97.1 F (36.2 C)   Filed Weights   10/04/19 1309  Weight: (!) 324 lb (147 kg)    Physical Exam  Constitutional: She is oriented to person, place, and time and well-developed, well-nourished, and in no distress.  Obese.  HENT:  Head: Normocephalic and atraumatic.  Mouth/Throat: Oropharynx is clear and moist. No oropharyngeal exudate.  Eyes: Pupils are equal, round, and reactive to light.  Neck: Normal range of motion. Neck supple.  Cardiovascular: Normal rate and regular rhythm.  Pulmonary/Chest: Effort normal and breath sounds normal. No respiratory distress. She has no wheezes.   Abdominal: Soft. Bowel sounds are normal. She exhibits no distension and no mass. There is no abdominal tenderness. There is no rebound and no guarding.  Musculoskeletal: Normal range of motion.        General: No tenderness or edema.  Neurological: She is alert and oriented to person, place, and time.  Skin: Skin is warm.  Psychiatric: Affect normal.    LABORATORY DATA:  I have reviewed the data as listed Lab Results  Component Value Date   WBC 6.2 09/05/2019   HGB 12.3 09/05/2019   HCT 38.5 09/05/2019   MCV 85 09/05/2019   PLT 259 09/05/2019   Recent Labs    07/27/19 0517 07/28/19 0747 07/29/19 0618 09/05/19 1432  NA 140 142 142 140  K 3.7 3.8 3.6 4.1  CL 108 112* 112* 103  CO2 25 23 23 24   GLUCOSE 90 85 114* 114*  BUN 12 7 8 18   CREATININE 0.73 0.72 0.74 0.80  CALCIUM 8.4* 8.5* 8.6* 9.6  GFRNONAA >60 >60 >60 94  GFRAA >60 >60 >60 108  PROT 6.1* 6.2*  --  7.0  ALBUMIN 3.3* 3.2*  --  4.1  AST 38 22  --  11  ALT 47* 33  --  13  ALKPHOS 58 49  --  72  BILITOT 0.4 0.5  --  <0.2    RADIOGRAPHIC STUDIES: I have personally reviewed the radiological images as listed and agreed with the findings in the report. No results found.  ASSESSMENT & PLAN:   Acute deep vein thrombosis (DVT) of calf muscle vein of right lower extremity # May 2020- Acute DVT of the right lower extremity- calf veins small clot burden s/p Xarelto 3 months.  Currently off anticoagulation-given the myomectomies/see below  #Reviewed the hypercoagulable-positive for factor V Leiden; given the previous provoked DVT/recent unprovoked DVT-I think is reasonable to consider indefinite anticoagulation-after myomectomy is completed.  # Given the last episode of postoperative bleeding recommend holding of starting Xarelto at this time.  Plan to start Xarelto approximately 2 weeks post last her myomectomy surgery.   #Patient aware that she should come off Xarelto and plan to get pregnant.  She is aware that  she needs to go on  subcu heparin/Lovenox at the time.  #Iron deficient anemia-secondary heavy menstrual periods/fibroids; currently improved.  No IV Venofer today.  #Fibroid uterus-currently undergoing myomectomy procedure with Dr. Prentice Docker.  Patient currently awaiting direction for further myomectomies.  See above.   # DISPOSITION:  # HOLD venofer # Follow up in 3 months- MD; cbc;  possible IV venofer-Dr.B  All questions were answered. The patient knows to call the clinic with any problems, questions or concerns.     Cammie Sickle, MD 10/04/2019 2:49 PM

## 2019-10-04 NOTE — Assessment & Plan Note (Addendum)
#   May 2020- Acute DVT of the right lower extremity- calf veins small clot burden s/p Xarelto 3 months.  Currently off anticoagulation-given the myomectomies/see below  #Reviewed the hypercoagulable-positive for factor V Leiden; given the previous provoked DVT/recent unprovoked DVT-I think is reasonable to consider indefinite anticoagulation-after myomectomy is completed.  # Given the last episode of postoperative bleeding recommend holding of starting Xarelto at this time.  Plan to start Xarelto approximately 2 weeks post last her myomectomy surgery.   #Patient aware that she should come off Xarelto and plan to get pregnant.  She is aware that she needs to go on subcu heparin/Lovenox at the time.  #Iron deficient anemia-secondary heavy menstrual periods/fibroids; currently improved.  No IV Venofer today.  #Fibroid uterus-currently undergoing myomectomy procedure with Dr. Prentice Docker.  Patient currently awaiting direction for further myomectomies.  See above.   # DISPOSITION:  # HOLD venofer # Follow up in 3 months- MD; cbc;  possible IV venofer-Dr.B

## 2019-10-05 ENCOUNTER — Ambulatory Visit: Payer: 59

## 2019-10-06 ENCOUNTER — Ambulatory Visit: Payer: 59

## 2019-10-11 ENCOUNTER — Ambulatory Visit (INDEPENDENT_AMBULATORY_CARE_PROVIDER_SITE_OTHER): Payer: 59 | Admitting: Obstetrics and Gynecology

## 2019-10-11 ENCOUNTER — Other Ambulatory Visit: Payer: Self-pay

## 2019-10-11 ENCOUNTER — Encounter: Payer: Self-pay | Admitting: Obstetrics and Gynecology

## 2019-10-11 VITALS — BP 122/80 | Ht 68.0 in | Wt 320.0 lb

## 2019-10-11 DIAGNOSIS — D250 Submucous leiomyoma of uterus: Secondary | ICD-10-CM

## 2019-10-11 DIAGNOSIS — N921 Excessive and frequent menstruation with irregular cycle: Secondary | ICD-10-CM

## 2019-10-11 DIAGNOSIS — N946 Dysmenorrhea, unspecified: Secondary | ICD-10-CM

## 2019-10-11 DIAGNOSIS — D251 Intramural leiomyoma of uterus: Secondary | ICD-10-CM

## 2019-10-11 DIAGNOSIS — D6851 Activated protein C resistance: Secondary | ICD-10-CM

## 2019-10-11 NOTE — Progress Notes (Signed)
   Postoperative Follow-up Patient presents post op from hysteroscopy, dilation and curettage, myomectomy 8 days ago for fibroid uterus, desire for fertility.  Subjective: Patient reports some improvement in her preop symptoms. Eating a regular diet without difficulty. The patient is not having any pain.  Activity: normal activities of daily living.  Objective: Vitals:   10/11/19 1315  BP: 122/80   Vital Signs: BP 122/80   Ht 5\' 8"  (1.727 m)   Wt (!) 320 lb (145.2 kg)   BMI 48.66 kg/m  Constitutional: Well nourished, well developed female in no acute distress.  HEENT: normal Skin: Warm and dry.  Extremity: no edema   Assessment: 38 y.o. s/p hysteroscopy, D&C, submucosal myomectomy progressing well  Plan: Patient has done well after surgery with no apparent complications.  I have discussed the post-operative course to date, and the expected progress moving forward.  The patient understands what complications to be concerned about.  I will see the patient in routine follow up, or sooner if needed.    Activity plan: No restriction.  We had a long and detailed conversation regarding her future fertility.  She has still multiple small fibroids remaining.  The larger fibroids have been removed.  She was informed that a fragment of one of the larger fibroids was left in her uterine cavity after the surgery and that she might expect that to pass.  She is not having any significant bleeding today nor has she had any since the surgery.  We discussed that risks with future pregnancies could include poor circulation to the placenta resulting in failed pregnancy or growth restriction of the baby.  Further we discussed abnormal placentation with her significant burden of submucosal fibroids.  We discussed that placenta accreta could result in the need for hysterectomy at the time of delivery at a tertiary care center.  We discussed that this is a serious pregnancy condition.  We further discussed  that I cannot definitively remove a significant volume of fibroids with further surgery, but there is a possibility that she would gain a significant improvement in her bleeding profile with more fibroids removed.  We discussed that her fluid deficit accrued quickly during her case and did not for me much time to operate and that this could occur again with future surgeries.  With all this information laid in front of the patient she opts to try for 1 more hysteroscopy and removal of fibroids.  We will schedule for soon as possible.  Prentice Docker, MD 10/11/2019, 1:33 PM

## 2019-10-16 ENCOUNTER — Encounter: Payer: Self-pay | Admitting: Family Medicine

## 2019-10-16 ENCOUNTER — Telehealth: Payer: Self-pay | Admitting: Obstetrics and Gynecology

## 2019-10-16 NOTE — Telephone Encounter (Signed)
Lmtrc

## 2019-10-16 NOTE — Telephone Encounter (Signed)
-----   Message from Will Bonnet, MD sent at 10/11/2019  2:30 PM EST ----- Regarding: schedule surgery Surgery Booking Request Patient Full Name:  Tara Shea  MRN: [id number]  DOB: 1981/03/13  Surgeon: Prentice Docker, MD  Requested Surgery Date and Time: Soon (prior to end of calendar year) Primary Diagnosis AND Code:  1) Intramural and submucous leiomyoma of uterus (D25.1, D25.0)  2) Menorrhagia with irregular cycle (N92.1) 3) Dysmenorrhea (N94.6) 4) Factor V Leiden mutation ([place])  (D68.51) Secondary Diagnosis and Code:  Surgical Procedure: hysteroscopy, dilation and curettage, submucosal myomectomy L&D Notification: No Admission Status: same day surgery Length of Surgery: 1 hour Special Case Needs: yes H&P: Yes Phone Interview???:  Yes Interpreter: No Language:  Medical Clearance:  No Special Scheduling Instructions: need rep available Any known health/anesthesia issues, diabetes, sleep apnea, latex allergy, defibrillator/pacemaker?: yes: history of DVT Acuity: P3   (P1 highest, P2 delay may cause harm, P3 low, elective gyn, P4 lowest)

## 2019-10-16 NOTE — Telephone Encounter (Signed)
Patient is aware of Pre-admit testing phone interview to be scheduled, COVID testing on 11/30, and OR on 10/26/19.

## 2019-10-16 NOTE — Telephone Encounter (Signed)
Patient is returning missed call from [place]. Patient aware Izora Gala is out of the office on lunch will have her call patient when she returns.

## 2019-10-20 ENCOUNTER — Ambulatory Visit (INDEPENDENT_AMBULATORY_CARE_PROVIDER_SITE_OTHER): Payer: 59 | Admitting: Family Medicine

## 2019-10-20 ENCOUNTER — Other Ambulatory Visit: Payer: Self-pay

## 2019-10-20 ENCOUNTER — Encounter: Payer: Self-pay | Admitting: Family Medicine

## 2019-10-20 ENCOUNTER — Encounter: Payer: Self-pay | Admitting: Internal Medicine

## 2019-10-20 DIAGNOSIS — B379 Candidiasis, unspecified: Secondary | ICD-10-CM | POA: Diagnosis not present

## 2019-10-20 DIAGNOSIS — J0110 Acute frontal sinusitis, unspecified: Secondary | ICD-10-CM

## 2019-10-20 DIAGNOSIS — T3695XA Adverse effect of unspecified systemic antibiotic, initial encounter: Secondary | ICD-10-CM

## 2019-10-20 MED ORDER — DOXYCYCLINE HYCLATE 100 MG PO TABS: 100.0000 mg | ORAL_TABLET | Freq: Two times a day (BID) | ORAL | 0 refills | Status: DC

## 2019-10-20 MED ORDER — FLUTICASONE PROPIONATE 50 MCG/ACT NA SUSP: 2.0000 | Freq: Every day | NASAL | 1 refills | Status: DC

## 2019-10-20 MED ORDER — BENZONATATE 100 MG PO CAPS: 100.0000 mg | ORAL_CAPSULE | Freq: Three times a day (TID) | ORAL | 1 refills | Status: DC | PRN

## 2019-10-20 MED ORDER — FLUCONAZOLE 150 MG PO TABS: 150.0000 mg | ORAL_TABLET | Freq: Once | ORAL | 0 refills | Status: AC

## 2019-10-20 NOTE — Progress Notes (Signed)
Name: Tara Shea   MRN: [id number]    DOB: [birth year]   Date:10/20/2019       Progress Note  Subjective  Chief Complaint  Chief Complaint  Patient presents with  . Sinusitis    congested, cough, post nasal drip, facial pressure    I connected with  [name] on 10/20/19 at 11:20 AM EST by telephone and verified that I am speaking with the correct person using two identifiers.   I discussed the limitations, risks, security and privacy concerns of performing an evaluation and management service by telephone and the availability of in person appointments. Staff also discussed with the patient that there may be a patient responsible charge related to this service. Patient Location: Home Provider Location: Office Additional Individuals present: None  HPI  Pt presents with concern for about 9 day history of sinus pain/headache, sinus congestion, post-nasal drainage, sinus and bilateral ear pain.  Does have frontal and maxillary sinus pain.  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 Denies chest pain, shortness of breath, NVD.    Patient Active Problem List   Diagnosis Date Noted  . Intramural and submucous leiomyoma of uterus 08/31/2019  . Menorrhagia with irregular cycle 08/31/2019  . Dysmenorrhea 08/31/2019  . B12 deficiency 08/09/2019  . Cholelithiasis 07/26/2019  . Iron deficiency anemia due to chronic blood loss 06/22/2019  . Acute deep vein thrombosis (DVT) of calf muscle vein of right lower extremity ([place]) 04/18/2019  . Factor V Leiden mutation ([place])   . Allergic rhinitis 03/01/2019  . Kidney stone 08/02/2018  . Asthma, mild intermittent 08/02/2018  . Migraine headache with aura 08/02/2018  . Morbid obesity ([place]) 08/02/2018    Social History   Tobacco Use  . Smoking status: Never Smoker  . Smokeless tobacco: Never Used  Substance Use Topics  . Alcohol use: Yes   Comment: occ     Current Outpatient Medications:  .  albuterol (PROVENTIL) (2.5 MG/3ML) 0.083% nebulizer solution, Take 3 mLs (2.5 mg total) by nebulization every 6 (six) hours as needed for wheezing or shortness of breath., Disp: 75 mL, Rfl: 12 .  albuterol (VENTOLIN HFA) 108 (90 Base) MCG/ACT inhaler, Inhale 2 puffs into the lungs every 4 (four) hours as needed for wheezing or shortness of breath., Disp: 18 g, Rfl: 3 .  ferrous sulfate 325 (65 FE) MG tablet, Take 325 mg by mouth daily., Disp: , Rfl:  .  fluticasone (FLOVENT HFA) 220 MCG/ACT inhaler, Inhale 2 puffs into the lungs 2 (two) times daily., Disp: 1 Inhaler, Rfl: 12 .  Multiple Vitamins-Minerals (MULTIVITAMIN WITH MINERALS) tablet, Take 1 tablet by mouth daily. , Disp: , Rfl:  .  Spacer/Aero-Holding Chambers (AEROCHAMBER PLUS) inhaler, Use as instructed, Disp: 1 each, Rfl: 2 .  vitamin B-12 (CYANOCOBALAMIN) 1000 MCG tablet, Take 1 tablet (1,000 mcg total) by mouth daily., Disp: 30 tablet, Rfl: 0 .  vitamin C (ASCORBIC ACID) 500 MG tablet, Take 500 mg by mouth daily., Disp: , Rfl:  .  HYDROcodone-acetaminophen (NORCO) 5-325 MG tablet, Take 1 tablet by mouth every 8 (eight) hours as needed (breakthrough pain). (Patient not taking: Reported on 10/11/2019), Disp: 8 tablet, Rfl: 0 .  ibuprofen (ADVIL) 600 MG tablet, Take 1 tablet (600 mg total) by mouth every 6 (six) hours as needed for mild pain or cramping. (Patient not taking: Reported on 10/17/2019), Disp: 30 tablet, Rfl: 0 .  ondansetron (ZOFRAN ODT) 4 MG disintegrating tablet, Take 1 tablet (4 mg total) by mouth every 6 (six) hours as needed for nausea. (Patient not taking: Reported on 10/20/2019), Disp: 20 tablet, Rfl: 0 .  promethazine (PHENERGAN) 25 MG tablet, Take 1 tablet (25 mg total) by mouth every 8 (eight) hours as needed for nausea or vomiting. (Patient not taking: Reported on 10/20/2019), Disp: 30 tablet, Rfl: 0 .  rivaroxaban (XARELTO) 10 MG TABS tablet, Take 10 mg by mouth  daily., Disp: , Rfl:   No Known Allergies  I personally reviewed active problem list, medication list, allergies with the patient/caregiver today.  ROS  Ten systems reviewed and is negative except as mentioned in HPI  Objective  Virtual encounter, vitals not obtained.  There is no height or weight on file to calculate BMI.  Nursing Note and Vital Signs reviewed.  Physical Exam  Pulmonary/Chest: Effort normal. No respiratory distress. Speaking in complete sentences Neurological: Pt is alert and oriented to person, place, and time. Coordination, speech and gait are normal.  Psychiatric: Patient has a normal mood and affect. behavior is normal. Judgment and thought content normal.  No results found for this or any previous visit (from the past 72 hour(s)).  Assessment & Plan  1. Antibiotic-induced yeast infection - fluconazole (DIFLUCAN) 150 MG tablet; Take 1 tablet (150 mg total) by mouth once for 1 dose. May repeat in 72 hours if not improving.  Dispense: 2 tablet; Refill: 0  2. Acute non-recurrent frontal sinusitis - doxycycline (VIBRA-TABS) 100 MG tablet; Take 1 tablet (100 mg total) by mouth 2 (two) times daily for 7 days.  Dispense: 14 tablet; Refill: 0 - benzonatate (TESSALON PERLES) 100 MG capsule; Take 1 capsule (100 mg total) by mouth 3 (three) times daily as needed for cough.  Dispense: 30 capsule; Refill: 1 - fluticasone (FLONASE) 50 MCG/ACT nasal spray; Place 2 sprays into both nostrils daily.  Dispense: 16 g; Refill: 1  -Red flags and when to present for emergency care or RTC including fever >101.62F, chest pain, shortness of breath, new/worsening/un-resolving symptoms, reviewed with patient at time of visit. Follow up and care instructions discussed and provided in AVS. - I discussed the assessment and treatment plan with the patient. The patient was provided an opportunity to ask questions and all were answered. The patient agreed with the plan and demonstrated an  understanding of the instructions.  - The patient was advised to call back or seek an in-person evaluation if the symptoms worsen or if the condition fails to improve as anticipated.  I provided 12 minutes of non-face-to-face time during this encounter.  Hubbard Hartshorn, FNP

## 2019-10-23 ENCOUNTER — Other Ambulatory Visit: Payer: Self-pay

## 2019-10-23 ENCOUNTER — Encounter
Admission: RE | Admit: 2019-10-23 | Discharge: 2019-10-23 | Disposition: A | Discharge status: Home / Self Care | Payer: 59 | Source: Ambulatory Visit | Attending: Obstetrics and Gynecology | Admitting: Obstetrics and Gynecology

## 2019-10-23 HISTORY — DX: Other complications of anesthesia, initial encounter: T88.59XA

## 2019-10-23 NOTE — Patient Instructions (Signed)
Your procedure is scheduled on: Thursday 10-26-2019 Report to Day Surgery on the 2nd floor of the [facility]. To find out your arrival time, please call [phone] between 1PM - 3PM on: Wednesday 12-2-20202  REMEMBER: Instructions that are not followed completely may result in serious medical risk, up to and including death; or upon the discretion of your surgeon and anesthesiologist your surgery may need to be rescheduled.  Do not eat food after midnight the night before surgery.  No gum chewing, lozengers or hard candies.  You may however, drink CLEAR liquids up to 2 hours before you are scheduled to arrive for your surgery. Do not drink anything within 2 hours of the start of your surgery.  Clear liquids include: - water  - apple juice without pulp - gatorade - black coffee or tea (Do NOT add milk or creamers to the coffee or tea) Do NOT drink anything that is not on this list.   IF GIVEN ENSURE PRE-SURGERY CARBOHYDRATE DRINK:  Complete drinking 3 hours prior to surgery.  No Alcohol for 24 hours before or after surgery.  No Smoking including e-cigarettes for 24 hours prior to surgery.  No chewable tobacco products for at least 6 hours prior to surgery.  No nicotine patches on the day of surgery.  On the morning of surgery brush your teeth with toothpaste and water, you may rinse your mouth with mouthwash if you wish. Do not swallow any toothpaste or mouthwash.  Notify your doctor if there is any change in your medical condition (cold, fever, infection).  Do not wear jewelry, make-up, hairpins, clips or nail polish.  Do not wear lotions, powders, or perfumes.   Do not shave 48 hours prior to surgery.   Contacts and dentures may not be worn into surgery.  Do not bring valuables to the hospital, including drivers license, insurance or credit cards.  [facility] is not responsible for any belongings or valuables.   TAKE THESE MEDICATIONS THE MORNING OF  SURGERY: none  Take shower with antibacterial soap.  Use inhalers on the day of surgery   Follow recommendations from Cardiologist, Pulmonologist or PCP regarding stopping Aspirin. Alen Blew is on hold.  Stop Anti-inflammatories (NSAIDS) such as Advil, Aleve, Ibuprofen, Motrin, Naproxen, Naprosyn and Aspirin based products such as Excedrin, Goodys Powder, BC Powder. (May take Tylenol or Acetaminophen if needed.)  Stop ANY OVER THE COUNTER supplements until after surgery. (May continue Vitamin D, Vitamin B, vit cand multivitamin.)  Wear comfortable clothing (specific to your surgery type) to the hospital.  Plan for stool softeners for home use.  If you are being discharged the day of surgery, you will not be allowed to drive home. You will need a responsible adult to drive you home and stay with you that night.   If you are taking public transportation, you will need to have a responsible adult with you. Please confirm with your physician that it is acceptable to use public transportation.   Please call [phone] if you have any questions about these instructions.

## 2019-10-24 ENCOUNTER — Other Ambulatory Visit
Admission: RE | Admit: 2019-10-24 | Discharge: 2019-10-24 | Disposition: A | Discharge status: Home / Self Care | Payer: 59 | Source: Ambulatory Visit | Attending: Obstetrics and Gynecology | Admitting: Obstetrics and Gynecology

## 2019-10-24 DIAGNOSIS — Z20828 Contact with and (suspected) exposure to other viral communicable diseases: Secondary | ICD-10-CM | POA: Diagnosis not present

## 2019-10-24 DIAGNOSIS — Z01812 Encounter for preprocedural laboratory examination: Secondary | ICD-10-CM | POA: Diagnosis not present

## 2019-10-24 LAB — SARS CORONAVIRUS 2 (TAT 6-24 HRS): SARS Coronavirus 2: NEGATIVE

## 2019-10-26 ENCOUNTER — Ambulatory Visit: Payer: 59 | Admitting: Anesthesiology

## 2019-10-26 ENCOUNTER — Encounter
Admission: RE | Disposition: A | Discharge status: Home / Self Care | Payer: Self-pay | Source: Home / Self Care | Attending: Obstetrics and Gynecology

## 2019-10-26 ENCOUNTER — Other Ambulatory Visit: Payer: Self-pay

## 2019-10-26 ENCOUNTER — Ambulatory Visit
Admission: RE | Admit: 2019-10-26 | Discharge: 2019-10-26 | Disposition: A | Discharge status: Home / Self Care | Payer: 59 | Attending: Obstetrics and Gynecology | Admitting: Obstetrics and Gynecology

## 2019-10-26 DIAGNOSIS — Z818 Family history of other mental and behavioral disorders: Secondary | ICD-10-CM | POA: Diagnosis not present

## 2019-10-26 DIAGNOSIS — Z833 Family history of diabetes mellitus: Secondary | ICD-10-CM | POA: Diagnosis not present

## 2019-10-26 DIAGNOSIS — D250 Submucous leiomyoma of uterus: Secondary | ICD-10-CM | POA: Diagnosis not present

## 2019-10-26 DIAGNOSIS — N920 Excessive and frequent menstruation with regular cycle: Secondary | ICD-10-CM | POA: Insufficient documentation

## 2019-10-26 DIAGNOSIS — Z79899 Other long term (current) drug therapy: Secondary | ICD-10-CM | POA: Insufficient documentation

## 2019-10-26 DIAGNOSIS — D251 Intramural leiomyoma of uterus: Secondary | ICD-10-CM | POA: Diagnosis not present

## 2019-10-26 DIAGNOSIS — N921 Excessive and frequent menstruation with irregular cycle: Secondary | ICD-10-CM

## 2019-10-26 DIAGNOSIS — Z800 Family history of malignant neoplasm of digestive organs: Secondary | ICD-10-CM | POA: Diagnosis not present

## 2019-10-26 DIAGNOSIS — Z86718 Personal history of other venous thrombosis and embolism: Secondary | ICD-10-CM | POA: Diagnosis not present

## 2019-10-26 DIAGNOSIS — E282 Polycystic ovarian syndrome: Secondary | ICD-10-CM | POA: Insufficient documentation

## 2019-10-26 DIAGNOSIS — Z7689 Persons encountering health services in other specified circumstances: Secondary | ICD-10-CM | POA: Diagnosis not present

## 2019-10-26 DIAGNOSIS — J45909 Unspecified asthma, uncomplicated: Secondary | ICD-10-CM | POA: Insufficient documentation

## 2019-10-26 DIAGNOSIS — N946 Dysmenorrhea, unspecified: Secondary | ICD-10-CM | POA: Insufficient documentation

## 2019-10-26 DIAGNOSIS — Z791 Long term (current) use of non-steroidal anti-inflammatories (NSAID): Secondary | ICD-10-CM | POA: Insufficient documentation

## 2019-10-26 DIAGNOSIS — Z825 Family history of asthma and other chronic lower respiratory diseases: Secondary | ICD-10-CM | POA: Diagnosis not present

## 2019-10-26 DIAGNOSIS — D6851 Activated protein C resistance: Secondary | ICD-10-CM | POA: Insufficient documentation

## 2019-10-26 DIAGNOSIS — Z6841 Body Mass Index (BMI) 40.0 and over, adult: Secondary | ICD-10-CM | POA: Diagnosis not present

## 2019-10-26 DIAGNOSIS — Z8249 Family history of ischemic heart disease and other diseases of the circulatory system: Secondary | ICD-10-CM | POA: Insufficient documentation

## 2019-10-26 DIAGNOSIS — D649 Anemia, unspecified: Secondary | ICD-10-CM | POA: Diagnosis not present

## 2019-10-26 DIAGNOSIS — Z803 Family history of malignant neoplasm of breast: Secondary | ICD-10-CM | POA: Diagnosis not present

## 2019-10-26 DIAGNOSIS — Z87442 Personal history of urinary calculi: Secondary | ICD-10-CM | POA: Insufficient documentation

## 2019-10-26 DIAGNOSIS — Z7951 Long term (current) use of inhaled steroids: Secondary | ICD-10-CM | POA: Diagnosis not present

## 2019-10-26 DIAGNOSIS — Z7901 Long term (current) use of anticoagulants: Secondary | ICD-10-CM | POA: Insufficient documentation

## 2019-10-26 HISTORY — PX: HYSTEROSCOPY WITH D & C: SHX1775

## 2019-10-26 LAB — POCT PREGNANCY, URINE: Preg Test, Ur: NEGATIVE

## 2019-10-26 SURGERY — DILATATION AND CURETTAGE /HYSTEROSCOPY
Anesthesia: General

## 2019-10-26 MED ORDER — PHENYLEPHRINE HCL (PRESSORS) 10 MG/ML IV SOLN
INTRAVENOUS | Status: DC | PRN
  Administered 2019-10-26: 100 ug via INTRAVENOUS

## 2019-10-26 MED ORDER — ACETAMINOPHEN 10 MG/ML IV SOLN
INTRAVENOUS | Status: DC | PRN
  Administered 2019-10-26: 1000 mg via INTRAVENOUS

## 2019-10-26 MED ORDER — KETOROLAC TROMETHAMINE 30 MG/ML IJ SOLN
INTRAMUSCULAR | Status: DC | PRN
  Administered 2019-10-26: 30 mg via INTRAVENOUS

## 2019-10-26 MED ORDER — FAMOTIDINE 20 MG PO TABS
20.0000 mg | ORAL_TABLET | Freq: Once | ORAL | Status: AC
  Administered 2019-10-26: 20 mg via ORAL

## 2019-10-26 MED ORDER — SODIUM CHLORIDE FLUSH 0.9 % IV SOLN
INTRAVENOUS | Status: AC
  Filled 2019-10-26: qty 10

## 2019-10-26 MED ORDER — FENTANYL CITRATE (PF) 100 MCG/2ML IJ SOLN
INTRAMUSCULAR | Status: AC
  Filled 2019-10-26: qty 2

## 2019-10-26 MED ORDER — SUCCINYLCHOLINE CHLORIDE 20 MG/ML IJ SOLN
INTRAMUSCULAR | Status: DC | PRN
  Administered 2019-10-26: 120 mg via INTRAVENOUS

## 2019-10-26 MED ORDER — MIDAZOLAM HCL 2 MG/2ML IJ SOLN
INTRAMUSCULAR | Status: DC | PRN
  Administered 2019-10-26: 2 mg via INTRAVENOUS

## 2019-10-26 MED ORDER — SUGAMMADEX SODIUM 200 MG/2ML IV SOLN
INTRAVENOUS | Status: AC
  Filled 2019-10-26: qty 4

## 2019-10-26 MED ORDER — ONDANSETRON HCL 4 MG/2ML IJ SOLN
INTRAMUSCULAR | Status: DC | PRN
  Administered 2019-10-26 (×2): 4 mg via INTRAVENOUS

## 2019-10-26 MED ORDER — ESMOLOL HCL 100 MG/10ML IV SOLN
INTRAVENOUS | Status: AC
  Filled 2019-10-26: qty 10

## 2019-10-26 MED ORDER — GLYCOPYRROLATE 0.2 MG/ML IJ SOLN
INTRAMUSCULAR | Status: DC | PRN
  Administered 2019-10-26: 0.2 mg via INTRAVENOUS

## 2019-10-26 MED ORDER — HYDROCODONE-ACETAMINOPHEN 5-325 MG PO TABS: 1.0000 | ORAL_TABLET | Freq: Three times a day (TID) | ORAL | 0 refills | Status: DC | PRN

## 2019-10-26 MED ORDER — SUGAMMADEX SODIUM 500 MG/5ML IV SOLN
INTRAVENOUS | Status: AC
  Filled 2019-10-26: qty 5

## 2019-10-26 MED ORDER — SEVOFLURANE IN SOLN
RESPIRATORY_TRACT | Status: AC
  Filled 2019-10-26: qty 250

## 2019-10-26 MED ORDER — FAMOTIDINE 20 MG PO TABS
ORAL_TABLET | ORAL | Status: AC
  Filled 2019-10-26: qty 1

## 2019-10-26 MED ORDER — ONDANSETRON HCL 4 MG/2ML IJ SOLN: 4.0000 mg | Freq: Once | INTRAMUSCULAR | Status: DC | PRN

## 2019-10-26 MED ORDER — FENTANYL CITRATE (PF) 100 MCG/2ML IJ SOLN
INTRAMUSCULAR | Status: DC | PRN
  Administered 2019-10-26 (×4): 50 ug via INTRAVENOUS

## 2019-10-26 MED ORDER — ROCURONIUM BROMIDE 100 MG/10ML IV SOLN
INTRAVENOUS | Status: DC | PRN
  Administered 2019-10-26: 10 mg via INTRAVENOUS

## 2019-10-26 MED ORDER — ROCURONIUM BROMIDE 50 MG/5ML IV SOLN
INTRAVENOUS | Status: AC
  Filled 2019-10-26: qty 1

## 2019-10-26 MED ORDER — LACTATED RINGERS IV SOLN
INTRAVENOUS | Status: DC
  Administered 2019-10-26 (×2): via INTRAVENOUS

## 2019-10-26 MED ORDER — ACETAMINOPHEN NICU IV SYRINGE 10 MG/ML
INTRAVENOUS | Status: AC
  Filled 2019-10-26: qty 1

## 2019-10-26 MED ORDER — PROMETHAZINE HCL 25 MG/ML IJ SOLN
INTRAMUSCULAR | Status: AC
  Administered 2019-10-26: 6.25 mg via INTRAVENOUS
  Filled 2019-10-26: qty 1

## 2019-10-26 MED ORDER — PROMETHAZINE HCL 25 MG/ML IJ SOLN
6.2500 mg | Freq: Once | INTRAMUSCULAR | Status: AC
  Administered 2019-10-26: 15:00:00 6.25 mg via INTRAVENOUS

## 2019-10-26 MED ORDER — SUGAMMADEX SODIUM 200 MG/2ML IV SOLN
INTRAVENOUS | Status: DC | PRN
  Administered 2019-10-26: 200 mg via INTRAVENOUS

## 2019-10-26 MED ORDER — LACTATED RINGERS IV SOLN: INTRAVENOUS | Status: DC

## 2019-10-26 MED ORDER — ALBUTEROL SULFATE HFA 108 (90 BASE) MCG/ACT IN AERS
INHALATION_SPRAY | RESPIRATORY_TRACT | Status: AC
  Filled 2019-10-26: qty 6.7

## 2019-10-26 MED ORDER — DEXAMETHASONE SODIUM PHOSPHATE 10 MG/ML IJ SOLN
INTRAMUSCULAR | Status: DC | PRN
  Administered 2019-10-26: 10 mg via INTRAVENOUS

## 2019-10-26 MED ORDER — FENTANYL CITRATE (PF) 100 MCG/2ML IJ SOLN
25.0000 ug | INTRAMUSCULAR | Status: DC | PRN
  Administered 2019-10-26: 25 ug via INTRAVENOUS

## 2019-10-26 MED ORDER — VASOPRESSIN 20 UNIT/ML IV SOLN
INTRAVENOUS | Status: AC
  Filled 2019-10-26: qty 1

## 2019-10-26 MED ORDER — MIDAZOLAM HCL 2 MG/2ML IJ SOLN
INTRAMUSCULAR | Status: AC
  Filled 2019-10-26: qty 2

## 2019-10-26 MED ORDER — PROPOFOL 10 MG/ML IV BOLUS
INTRAVENOUS | Status: DC | PRN
  Administered 2019-10-26: 200 mg via INTRAVENOUS

## 2019-10-26 MED ORDER — SODIUM CHLORIDE (PF) 0.9 % IJ SOLN
INTRAMUSCULAR | Status: AC
  Filled 2019-10-26: qty 50

## 2019-10-26 MED ORDER — KETOROLAC TROMETHAMINE 30 MG/ML IJ SOLN
INTRAMUSCULAR | Status: AC
  Filled 2019-10-26: qty 2

## 2019-10-26 MED ORDER — LIDOCAINE HCL (CARDIAC) PF 100 MG/5ML IV SOSY
PREFILLED_SYRINGE | INTRAVENOUS | Status: DC | PRN
  Administered 2019-10-26: 100 mg via INTRAVENOUS

## 2019-10-26 SURGICAL SUPPLY — 28 items
BAG URINE DRAIN 2000ML AR STRL (UROLOGICAL SUPPLIES) IMPLANT
BAG WASTE FLUENT 6L (MISCELLANEOUS) ×4 IMPLANT
CANISTER SUCT 3000ML PPV (MISCELLANEOUS) ×2 IMPLANT
CATH FOLEY 2WAY  5CC 16FR (CATHETERS)
CATH ROBINSON RED A/P 16FR (CATHETERS) ×2 IMPLANT
CATH URTH 16FR FL 2W BLN LF (CATHETERS) IMPLANT
COVER WAND RF STERILE (DRAPES) ×2 IMPLANT
DEVICE MYOSURE LITE (MISCELLANEOUS) IMPLANT
ELECT REM PT RETURN 9FT ADLT (ELECTROSURGICAL) ×2
ELECTRODE REM PT RTRN 9FT ADLT (ELECTROSURGICAL) ×1 IMPLANT
ENDOLOOP SUT PDS II  0 18 (SUTURE) ×1
ENDOLOOP SUT PDS II 0 18 (SUTURE) ×1 IMPLANT
GLOVE BIO SURGEON STRL SZ7 (GLOVE) ×2 IMPLANT
GLOVE BIOGEL PI IND STRL 7.5 (GLOVE) ×1 IMPLANT
GLOVE BIOGEL PI INDICATOR 7.5 (GLOVE) ×1
GOWN STRL REUS W/ TWL LRG LVL3 (GOWN DISPOSABLE) ×2 IMPLANT
GOWN STRL REUS W/TWL LRG LVL3 (GOWN DISPOSABLE) ×2
IV LACTATED RINGERS 1000ML (IV SOLUTION) ×2 IMPLANT
IV NS IRRIG 3000ML ARTHROMATIC (IV SOLUTION) ×10 IMPLANT
KIT PROCEDURE FLUENT (KITS) ×2 IMPLANT
KIT TURNOVER CYSTO (KITS) ×2 IMPLANT
MYOSURE XL FIBROID (MISCELLANEOUS) ×4
PACK DNC HYST (MISCELLANEOUS) ×2 IMPLANT
PAD OB MATERNITY 4.3X12.25 (PERSONAL CARE ITEMS) ×2 IMPLANT
PAD PREP 24X41 OB/GYN DISP (PERSONAL CARE ITEMS) ×2 IMPLANT
SEAL ROD LENS SCOPE MYOSURE (ABLATOR) ×2 IMPLANT
SYSTEM TISS REMOVAL MYOSURE XL (MISCELLANEOUS) ×2 IMPLANT
TUBING CONNECTING 10 (TUBING) ×2 IMPLANT

## 2019-10-26 NOTE — Anesthesia Postprocedure Evaluation (Signed)
Anesthesia Post Note  Patient: Tara Shea  Procedure(s) Performed: DILATATION AND CURETTAGE /HYSTEROSCOPY, SUBMUCOSAL MYOMECTOMY (N/A )  Patient location during evaluation: PACU Anesthesia Type: General Level of consciousness: awake and alert and oriented Pain management: pain level controlled Vital Signs Assessment: post-procedure vital signs reviewed and stable Respiratory status: spontaneous breathing Cardiovascular status: blood pressure returned to baseline Anesthetic complications: no     Last Vitals:  Vitals:   10/26/19 1518 10/26/19 1545  BP: 112/78 115/73  Pulse: 65 72  Resp:  17  Temp: (!) 36.3 C (!) 36.4 C  SpO2: 100% 100%    Last Pain:  Vitals:   10/26/19 1545  TempSrc:   PainSc: 0-No pain                 Berania Peedin

## 2019-10-26 NOTE — Anesthesia Preprocedure Evaluation (Signed)
Anesthesia Evaluation  Patient identified by MRN, date of birth, ID band Patient awake    Reviewed: Allergy & Precautions, NPO status , Patient's Chart, lab work & pertinent test results  History of Anesthesia Complications (+) PONV and history of anesthetic complications  Airway Mallampati: III       Dental   Pulmonary asthma , neg sleep apnea, neg COPD, Not current smoker,           Cardiovascular (-) hypertension(-) Past MI and (-) CHF (-) dysrhythmias (-) Valvular Problems/Murmurs     Neuro/Psych neg Seizures    GI/Hepatic Neg liver ROS, neg GERD  ,  Endo/Other  neg diabetes  Renal/GU Renal disease (stones)     Musculoskeletal   Abdominal   Peds  Hematology  (+) anemia ,   Anesthesia Other Findings   Reproductive/Obstetrics                             Anesthesia Physical Anesthesia Plan  ASA: III  Anesthesia Plan: General   Post-op Pain Management:    Induction: Intravenous  PONV Risk Score and Plan: 4 or greater and Propofol infusion, TIVA, Ondansetron and Dexamethasone  Airway Management Planned: Oral ETT  Additional Equipment:   Intra-op Plan:   Post-operative Plan:   Informed Consent: I have reviewed the patients History and Physical, chart, labs and discussed the procedure including the risks, benefits and alternatives for the proposed anesthesia with the patient or authorized representative who has indicated his/her understanding and acceptance.       Plan Discussed with:   Anesthesia Plan Comments:         Anesthesia Quick Evaluation

## 2019-10-26 NOTE — Anesthesia Procedure Notes (Signed)
Procedure Name: Intubation Date/Time: 10/26/2019 12:13 PM Performed by: Doreen Salvage, CRNA Pre-anesthesia Checklist: Patient identified, Patient being monitored, Timeout performed, Emergency Drugs available and Suction available Patient Re-evaluated:Patient Re-evaluated prior to induction Oxygen Delivery Method: Circle system utilized Preoxygenation: Pre-oxygenation with 100% oxygen Induction Type: IV induction Ventilation: Mask ventilation without difficulty Laryngoscope Size: Mac, 3 and McGraph Grade View: Grade I Tube type: Oral Tube size: 7.0 mm Number of attempts: 1 Airway Equipment and Method: Stylet Placement Confirmation: ETT inserted through vocal cords under direct vision,  positive ETCO2 and breath sounds checked- equal and bilateral Secured at: 20 cm Tube secured with: Tape Dental Injury: Teeth and Oropharynx as per pre-operative assessment

## 2019-10-26 NOTE — Progress Notes (Signed)
Nausea better.

## 2019-10-26 NOTE — Progress Notes (Signed)
Nauseated phenergan given

## 2019-10-26 NOTE — Op Note (Signed)
Operative Note   10/26/2019  PRE-OP DIAGNOSIS:  1) Menorrhagia with irregular cycle 2) Intramural and submucous leiomyoma of the uterus 3) Dysmenorrhea   POST-OP DIAGNOSIS:  1) Menorrhagia with irregular cycle 2) Intramural and submucous leiomyoma of the uterus 3) Dysmenorrhea   SURGEON: Surgeon(s) and Role:    Will Bonnet, MD - Primary  PROCEDURE: Procedure(s): 1) Hysteroscopy 2) Dilation and curettage 3) submucosal myomectomy using Myosure  ANESTHESIA: General LMA  ESTIMATED BLOOD LOSS: 75 mL  DRAINS: none   TOTAL IV FLUIDS: 1,100 mL  SPECIMENS:  Fragments of submucosal fibroids and endometrium  VTE PROPHYLAXIS: SCDs to the bilateral lower extremities  ANTIBIOTICS: none indicated and none given  FLUID DEFICIT: 0000000 mL  COMPLICATIONS: none  DISPOSITION: PACU - hemodynamically stable.  CONDITION: stable  INDICATION: 38 y.o. G0P0000 female with history of severe menorrhagia with irregular cycle and resultant anemia diagnosed with multiple submucosal fibroids. She presents for a third attempt to remove submucosal fibroids as she desires to reduce menstrual blood losss and have children.   FINDINGS: Exam under anesthesia revealed mobile anteverted uterus without adnexal masses or fullness. Hysteroscopy revealed multiple fibroids impinging and distorting the uterine cavity.  There appeared to be an increase in the size of the fibroids from last attempt three weeks ago.   PROCEDURE IN DETAIL:  After informed consent was obtained, the patient was taken to the operating room where anesthesia was obtained without difficulty. The patient was positioned in the dorsal, supine lithotomy position in [place].  The patient's bladder was catheterized with an in and out foley catheter.  The patient was examined under anesthesia, with the above noted findings.  The bi-valved speculum was placed inside the patient's vagina, and the the anterior lip of the cervix was seen  and grasped with the tenaculum.  The cervix was dilated to a 7 mm Hegar dilator.  The hysteroscope was introduced, with the above noted findings.  The MyoSure XL device was utilized after placement of a second clamp around the cervix due to leakage of distending fluid.  An attempt was made to place an EndoLoop, but this was unsuccessful.  A Hologic device that fits over the scope and blocks the cervix was utilized and initially worked well. However, it did not allow for the MyoSure XL cutting device to reach the fundus.  So, this had to be removed and the second tenaculum placed.  Multiple fibroids were removed with the based of three significant fibroids remaining at the fundus.  Still, at least half of these fibroids were removed.  There were still multiple small fibroids studding the wall of the uterus.  However, overall a great reduction in the burden of the fibroids was appreciated at the end of the case.  Due to the fluid deficit quickly closing in on 2,500 mL and with no significant progress being made, the case was terminated at this point.  Verification was made that bleeding from the myomectomy sites was minimal-to-absent as pressure was reduced on the distending fluid.    The hystersocope was removed.  The tenaculums were removed from the cervix.  Excellent hemostasis was noted at the tenaculum entry sites.  Verification was made that no instruments or sponges remained in the vagina.  The cervix was monitored for 10 minutes to ensure no bleeding occurred at the end of the case.  She was then taken out of dorsal lithotomy.  The patient tolerated the procedure well.  Sponge, lap and needle counts were correct  x2.  The patient was taken to recovery room in excellent condition.  Will Bonnet, MD, [place] 10/26/2019 1:54 PM

## 2019-10-26 NOTE — H&P (Signed)
Preoperative History and Physical  Tara Shea is a 38 y.o. G0P0000 here for surgical management of menorrhagia and submucosal fibroid.     History of Present Illness: 38 y.o. G0P0000 female who presents for her third surgery to remove submucosal fibroids.   She underwent a hysteroscopy, dilation and curettage, submucosal myomectomy on 08/31/2019 and 10/03/2019, during which only part of the large number of fibroids were removed.  Both procedures were terminated due to reaching the maximum fluid deficit.  On her workup for the issues, she was noted to have a mass noted in her uterus on CT scan during a previous hospitalization. A pelvic ultrasound noted a heterogeneous, predominantly solid mass within the endometrium, measuring 5.3 cm in size with internal blood flow noted on doppler interrogation. She states she underwent a myomectomy 9 years ago, but she states that she was told there was one remaining in themyometrium. In May this year she was diagnosed with another DVT and was started on Xarelto. Her first DVT was considered provoked. Her most recent DVT was considered unprovoked. She is not taking anticoagulation currently.  Her recent thrombophilia workup was positive only for Factor V Leiden heterozygosity. She had only been using condoms for birth control. She has received an iron infusion due to heavy menses. Nine years ago she was on warfarin and the same thing happened in terms of heavy bleeding. During her first surgery multiple submucosal fibroids were noted.  The largest two on her left side were removed and part of the largest one on her right side was removed during her last surgery.  On the second surgery her right side fibroid was completely detached. However, she quickly reached her maximum fluid deficit and the procedure had to be terminated.  This surgery is to make sure as many remaining fibroids are removed as possible.  She is intent on having all submucosal fibroids in  order for her to achieve pregnancy.   She has no symptoms of cancer and her pathology from her prior surgeries was benign.    Proposed surgery: Hysteroscopy, dilation and curettage, submucosal myomectomy  Past Medical History:  Diagnosis Date  . Asthma   . Bone spur   . Complication of anesthesia    pt had hypotensive episode and has had n & v  . DVT (deep venous thrombosis) ([place]) 03/2019   x 1 and DEC 2011  . Factor 5 Leiden mutation, heterozygous ([place])    stopped Xarelto 08/23/19  . Fibroid   . History of kidney stones   . IDA (iron deficiency anemia)    vitamin b12 and iron deficiency  . Migraines   . PCOS (polycystic ovarian syndrome)    Past Surgical History:  Procedure Laterality Date  . CHOLECYSTECTOMY N/A 07/28/2019   Procedure: LAPAROSCOPIC CHOLECYSTECTOMY;  Surgeon: Jules Husbands, MD;  Location: [facility] ORS;  Service: General;  Laterality: N/A;  . HYSTEROSCOPY W/D&C N/A 08/31/2019   Procedure: DILATATION AND CURETTAGE /HYSTEROSCOPY/ SUBMUCOSAL MYOMECTOMY;  Surgeon: Will Bonnet, MD;  Location: [facility] ORS;  Service: Gynecology;  Laterality: N/A;  . HYSTEROSCOPY W/D&C N/A 10/03/2019   Procedure: DILATATION AND CURETTAGE /HYSTEROSCOPY/ SUBMUCOSAL MYOMECTOMY;  Surgeon: Will Bonnet, MD;  Location: [facility] OR[facility];  Service: Gynecology;  Laterality: N/A;  . MYOMECTOMY    . TONSILLECTOMY    . WISDOM TOOTH EXTRACTION    . WRIST SURGERY     age 44 - rebroke it b/c it grew back wrong   OB History  Gravida Para Term Preterm  AB Living  0 0 0 0 0 0  SAB TAB Ectopic Multiple Live Births  0 0 0 0 0  Patient denies any other pertinent gynecologic issues.   No current facility-administered medications on file prior to encounter.    Current Outpatient Medications on File Prior to Encounter  Medication Sig Dispense Refill  . albuterol (PROVENTIL) (2.5 MG/3ML) 0.083% nebulizer solution Take 3 mLs (2.5 mg total) by nebulization every 6 (six) hours as needed for wheezing or  shortness of breath. 75 mL 12  . albuterol (VENTOLIN HFA) 108 (90 Base) MCG/ACT inhaler Inhale 2 puffs into the lungs every 4 (four) hours as needed for wheezing or shortness of breath. 18 g 3  . ferrous sulfate 325 (65 FE) MG tablet Take 325 mg by mouth daily.    . fluticasone (FLOVENT HFA) 220 MCG/ACT inhaler Inhale 2 puffs into the lungs 2 (two) times daily. 1 Inhaler 12  . Multiple Vitamins-Minerals (MULTIVITAMIN WITH MINERALS) tablet Take 1 tablet by mouth daily.     . ondansetron (ZOFRAN ODT) 4 MG disintegrating tablet Take 1 tablet (4 mg total) by mouth every 6 (six) hours as needed for nausea. 20 tablet 0  . promethazine (PHENERGAN) 25 MG tablet Take 1 tablet (25 mg total) by mouth every 8 (eight) hours as needed for nausea or vomiting. 30 tablet 0  . vitamin B-12 (CYANOCOBALAMIN) 1000 MCG tablet Take 1 tablet (1,000 mcg total) by mouth daily. 30 tablet 0  . vitamin C (ASCORBIC ACID) 500 MG tablet Take 500 mg by mouth daily.    Marland Kitchen HYDROcodone-acetaminophen (NORCO) 5-325 MG tablet Take 1 tablet by mouth every 8 (eight) hours as needed (breakthrough pain). (Patient not taking: Reported on 10/11/2019) 8 tablet 0  . ibuprofen (ADVIL) 600 MG tablet Take 1 tablet (600 mg total) by mouth every 6 (six) hours as needed for mild pain or cramping. (Patient not taking: Reported on 10/17/2019) 30 tablet 0  . Spacer/Aero-Holding Chambers (AEROCHAMBER PLUS) inhaler Use as instructed 1 each 2   Allergies: No Known Allergies  Social History:   reports that she has never smoked. She has never used smokeless tobacco. She reports current alcohol use. She reports that she does not use drugs.  Family History  Problem Relation Age of Onset  . Heart murmur Mother   . Thyroid disease Mother   . Migraines Mother   . Asthma Mother   . Diabetes Mother        TYPE 2  . Bipolar disorder Mother   . Breast cancer Maternal Aunt 71  . Cancer Maternal Uncle        skin  . Cancer Maternal Grandmother 33       colon   . Hypertension Maternal Grandmother   . Cancer Maternal Aunt 50       colon  . Diabetes Maternal Aunt     Review of Systems: Noncontributory  PHYSICAL EXAM: Blood pressure 107/72, pulse 77, temperature 98.1 F (36.7 C), temperature source Oral, resp. rate 16, height 5\' 8"  (1.727 m), weight (!) 143.3 kg, last menstrual period 10/19/2019, SpO2 99 %. Physical Exam Constitutional:      General: She is not in acute distress.    Appearance: Normal appearance. She is well-developed.  HENT:     Head: Normocephalic and atraumatic.  Eyes:     General: No scleral icterus.    Conjunctiva/sclera: Conjunctivae normal.  Neck:     Musculoskeletal: Normal range of motion and neck supple.  Cardiovascular:  Rate and Rhythm: Normal rate and regular rhythm.     Heart sounds: No murmur. No friction rub. No gallop.   Pulmonary:     Effort: Pulmonary effort is normal. No respiratory distress.     Breath sounds: Normal breath sounds. No wheezing or rales.  Abdominal:     General: Bowel sounds are normal. There is no distension.     Palpations: Abdomen is soft. There is no mass.     Tenderness: There is no abdominal tenderness. There is no guarding or rebound.  Musculoskeletal: Normal range of motion.  Neurological:     General: No focal deficit present.     Mental Status: She is alert and oriented to person, place, and time.     Cranial Nerves: No cranial nerve deficit.  Skin:    General: Skin is warm and dry.     Findings: No erythema.  Psychiatric:        Mood and Affect: Mood normal.        Behavior: Behavior normal.        Judgment: Judgment normal.      Labs: Results for orders placed or performed during the hospital encounter of 10/26/19 (from the past 336 hour(s))  Pregnancy, urine POC   Collection Time: 10/26/19  9:37 AM  Result Value Ref Range   Preg Test, Ur NEGATIVE NEGATIVE  Results for orders placed or performed during the hospital encounter of 10/24/19 (from the past  336 hour(s))  SARS CORONAVIRUS 2 (TAT 6-24 HRS) Nasopharyngeal Nasopharyngeal Swab   Collection Time: 10/24/19 10:26 AM   Specimen: Nasopharyngeal Swab  Result Value Ref Range   SARS Coronavirus 2 NEGATIVE NEGATIVE     Assessment: Patient Active Problem List   Diagnosis Date Noted  . Intramural and submucous leiomyoma of uterus 08/31/2019  . Menorrhagia with irregular cycle 08/31/2019  . Dysmenorrhea 08/31/2019   Plan: Patient will undergo surgical management with the above noted surgery.   The risks of surgery were discussed in detail with the patient including but not limited to: bleeding which may require transfusion or reoperation; infection which may require antibiotics; injury to surrounding organs which may involve bowel, bladder, ureters ; need for additional procedures including laparoscopy or laparotomy; thromboembolic phenomenon, surgical site problems and other postoperative/anesthesia complications. Likelihood of success in alleviating the patient's condition was discussed. Routine postoperative instructions will be reviewed with the patient and her family in detail after surgery.  The patient concurred with the proposed plan, giving informed written consent for the surgery.  Patient has been NPO since last night she will remain NPO for procedure.  Anesthesia and OR aware.  Preoperative prophylactic antibiotics, as indicated, and SCDs ordered on call to the OR.  To OR when ready.  Prentice Docker, MD 10/26/2019 11:19 AM

## 2019-10-26 NOTE — Transfer of Care (Signed)
Immediate Anesthesia Transfer of Care Note  Patient: Tara Shea  Procedure(s) Performed: DILATATION AND CURETTAGE /HYSTEROSCOPY, SUBMUCOSAL MYOMECTOMY (N/A )  Patient Location: PACU  Anesthesia Type:General  Level of Consciousness: awake, drowsy and patient cooperative  Airway & Oxygen Therapy: Patient Spontanous Breathing and Patient connected to face mask oxygen  Post-op Assessment: Report given to RN and Post -op Vital signs reviewed and stable  Post vital signs: Reviewed and stable  Last Vitals:  Vitals Value Taken Time  BP 125/84 10/26/19 1405  Temp 35.9 C 10/26/19 1405  Pulse 72 10/26/19 1407  Resp 18 10/26/19 1407  SpO2 100 % 10/26/19 1407  Vitals shown include unvalidated device data.  Last Pain:  Vitals:   10/26/19 0953  TempSrc: Oral  PainSc: 0-No pain         Complications: No apparent anesthesia complications

## 2019-10-26 NOTE — Anesthesia Post-op Follow-up Note (Signed)
Anesthesia QCDR form completed.        

## 2019-10-27 ENCOUNTER — Encounter: Payer: Self-pay | Admitting: Obstetrics and Gynecology

## 2019-10-30 ENCOUNTER — Telehealth: Payer: Self-pay | Admitting: Internal Medicine

## 2019-10-30 LAB — SURGICAL PATHOLOGY

## 2019-10-30 MED ORDER — RIVAROXABAN 20 MG PO TABS: 20.0000 mg | ORAL_TABLET | Freq: Every day | ORAL | 1 refills | Status: DC

## 2019-10-30 NOTE — Telephone Encounter (Signed)
Late entry - on 12/04-I spoke  to patient regarding anticoagulation-recommend starting Xarelto 10 mg 1 week after her last surgery.  Then start Xarelto 20 mg once a day; new prescription will be sent.  Patient will need indefinite anticoagulation.  Patient understand that she should not get pregnant while on Xarelto.

## 2019-11-09 ENCOUNTER — Ambulatory Visit (INDEPENDENT_AMBULATORY_CARE_PROVIDER_SITE_OTHER): Payer: 59 | Admitting: Obstetrics and Gynecology

## 2019-11-09 ENCOUNTER — Other Ambulatory Visit: Payer: Self-pay

## 2019-11-09 ENCOUNTER — Encounter: Payer: Self-pay | Admitting: Internal Medicine

## 2019-11-09 ENCOUNTER — Encounter: Payer: Self-pay | Admitting: Obstetrics and Gynecology

## 2019-11-09 ENCOUNTER — Ambulatory Visit: Payer: 59 | Admitting: Obstetrics and Gynecology

## 2019-11-09 VITALS — BP 148/78 | HR 85 | Wt 325.0 lb

## 2019-11-09 DIAGNOSIS — N921 Excessive and frequent menstruation with irregular cycle: Secondary | ICD-10-CM | POA: Diagnosis not present

## 2019-11-09 DIAGNOSIS — N946 Dysmenorrhea, unspecified: Secondary | ICD-10-CM

## 2019-11-09 DIAGNOSIS — D251 Intramural leiomyoma of uterus: Secondary | ICD-10-CM

## 2019-11-09 DIAGNOSIS — Z09 Encounter for follow-up examination after completed treatment for conditions other than malignant neoplasm: Secondary | ICD-10-CM | POA: Diagnosis not present

## 2019-11-09 DIAGNOSIS — D250 Submucous leiomyoma of uterus: Secondary | ICD-10-CM | POA: Diagnosis not present

## 2019-11-09 NOTE — Progress Notes (Signed)
   Postoperative Follow-up Patient presents post op from Hysteroscopy, dilation and curettage, myomectomy (submucosal) 2 weeks ago for menorrhagia with irregular cycle, uterine fibroids, dysmenorrhea.  Subjective: Patient reports some improvement in her preop symptoms. Eating a regular diet without difficulty. The patient is not having any pain.  Activity: normal activities of daily living.  Objective: Vitals:   11/09/19 1115  BP: (!) 148/78  Pulse: 85   Vital Signs: BP (!) 148/78   Pulse 85   Wt (!) 325 lb (147.4 kg)   LMP 10/19/2019   BMI 49.42 kg/m  Constitutional: Well nourished, well developed female in no acute distress.  HEENT: normal Skin: Warm and dry.  Extremity: no edema   Assessment: 38 y.o. s/p hysteroscopy, dilation and curettage, submucosal myomectomy  progressing well  Plan: Patient has done well after surgery with no apparent complications.  I have discussed the post-operative course to date, and the expected progress moving forward.  The patient understands what complications to be concerned about.  I will see the patient in routine follow up, or sooner if needed.    Activity plan: No restriction.  Discussed plan moving forward. The patient very much wants to conceive. We again discussed risks of pregnancy with a remaining large burden of, at a minimum, a large number of small submucosal myomas.  These risks include; placenta accreta spectrum, early pregnancy loss, preterm delivery, difficulty with conception.  We discussed a second opinion with a reproductive endocrinology, and infertility specialist.  Recommend she wait a couple months prior to attempting conception.  She is currently on an oral anticoagulant. She is already working with her hematologist, Dr. Rogue Bussing, regarding changing to lovenox when attempting conception.  I concur with this recommendation for pregnancy.  All questions answered at this time.    Prentice Docker, MD 11/09/2019, 11:43 AM

## 2019-11-10 ENCOUNTER — Encounter: Payer: Self-pay | Admitting: Obstetrics and Gynecology

## 2019-11-14 ENCOUNTER — Ambulatory Visit: Payer: 59 | Admitting: Family Medicine

## 2019-11-15 ENCOUNTER — Telehealth: Payer: Self-pay | Admitting: *Deleted

## 2019-11-15 ENCOUNTER — Other Ambulatory Visit: Payer: Self-pay

## 2019-11-15 ENCOUNTER — Encounter: Payer: Self-pay | Admitting: Licensed Clinical Social Worker

## 2019-11-15 ENCOUNTER — Ambulatory Visit
Admission: RE | Admit: 2019-11-15 | Discharge: 2019-11-15 | Disposition: A | Discharge status: Home / Self Care | Payer: 59 | Source: Ambulatory Visit | Attending: Internal Medicine | Admitting: Internal Medicine

## 2019-11-15 DIAGNOSIS — R600 Localized edema: Secondary | ICD-10-CM

## 2019-11-15 DIAGNOSIS — I82461 Acute embolism and thrombosis of right calf muscular vein: Secondary | ICD-10-CM

## 2019-11-15 DIAGNOSIS — M79605 Pain in left leg: Secondary | ICD-10-CM | POA: Diagnosis not present

## 2019-11-15 DIAGNOSIS — M7989 Other specified soft tissue disorders: Secondary | ICD-10-CM | POA: Diagnosis not present

## 2019-11-15 NOTE — Telephone Encounter (Signed)
Patient informed that an [place] has been ordered and to expect a call from scheduler with the appointment time. She si agreeable to this plan

## 2019-11-15 NOTE — Telephone Encounter (Signed)
Patient called reporting that She is having swelling of her left foot and ankle (feels very tight) after being in the car for more than 3 hours last night. She is concerned about a possible blood clot since this resembles her clot from 9 years ago and also the fact that she had been off of her blood thinners until a week ago. It is not as swollen this morning, but still puffy and tight. Please advise

## 2019-11-15 NOTE — Telephone Encounter (Signed)
Spoke with Dr. Rogue Bussing - please order a stat lower extremity doppler. msg sent to [facility] in scheduling. Hassan Rowan please Inform pt of plan of care.

## 2019-11-22 ENCOUNTER — Ambulatory Visit (INDEPENDENT_AMBULATORY_CARE_PROVIDER_SITE_OTHER): Payer: 59 | Admitting: Family Medicine

## 2019-11-22 ENCOUNTER — Encounter: Payer: Self-pay | Admitting: Family Medicine

## 2019-11-22 ENCOUNTER — Other Ambulatory Visit: Payer: Self-pay

## 2019-11-22 DIAGNOSIS — J4520 Mild intermittent asthma, uncomplicated: Secondary | ICD-10-CM | POA: Diagnosis not present

## 2019-11-22 DIAGNOSIS — J309 Allergic rhinitis, unspecified: Secondary | ICD-10-CM | POA: Diagnosis not present

## 2019-11-22 DIAGNOSIS — N946 Dysmenorrhea, unspecified: Secondary | ICD-10-CM | POA: Diagnosis not present

## 2019-11-22 DIAGNOSIS — D251 Intramural leiomyoma of uterus: Secondary | ICD-10-CM

## 2019-11-22 DIAGNOSIS — D250 Submucous leiomyoma of uterus: Secondary | ICD-10-CM

## 2019-11-22 DIAGNOSIS — R112 Nausea with vomiting, unspecified: Secondary | ICD-10-CM

## 2019-11-22 DIAGNOSIS — D6851 Activated protein C resistance: Secondary | ICD-10-CM | POA: Diagnosis not present

## 2019-11-22 DIAGNOSIS — E538 Deficiency of other specified B group vitamins: Secondary | ICD-10-CM

## 2019-11-22 DIAGNOSIS — K219 Gastro-esophageal reflux disease without esophagitis: Secondary | ICD-10-CM | POA: Diagnosis not present

## 2019-11-22 DIAGNOSIS — D500 Iron deficiency anemia secondary to blood loss (chronic): Secondary | ICD-10-CM | POA: Diagnosis not present

## 2019-11-22 MED ORDER — FLUTICASONE-SALMETEROL 250-50 MCG/DOSE IN AEPB: 1.0000 | INHALATION_SPRAY | Freq: Two times a day (BID) | RESPIRATORY_TRACT | 3 refills | Status: DC

## 2019-11-22 MED ORDER — ONDANSETRON 4 MG PO TBDP: 4.0000 mg | ORAL_TABLET | Freq: Four times a day (QID) | ORAL | 1 refills | Status: DC | PRN

## 2019-11-22 MED ORDER — FAMOTIDINE 40 MG PO TABS: 40.0000 mg | ORAL_TABLET | Freq: Every day | ORAL | 1 refills | Status: DC

## 2019-11-22 MED ORDER — PROMETHAZINE HCL 25 MG PO TABS: 25.0000 mg | ORAL_TABLET | Freq: Three times a day (TID) | ORAL | 1 refills | Status: AC | PRN

## 2019-11-22 NOTE — Progress Notes (Signed)
Name: ERIANNY WAKEHAM   MRN: [id number]    DOB: Jan 31, 1981   Date:11/22/2019       Progress Note  Subjective  Chief Complaint  Chief Complaint  Patient presents with  . Follow-up    I connected with  [name]  on 11/22/19 at  8:00 AM EST by a video enabled telemedicine application and verified that I am speaking with the correct person using two identifiers.  I discussed the limitations of evaluation and management by telemedicine and the availability of in person appointments. The patient expressed understanding and agreed to proceed. Staff also discussed with the patient that there may be a patient responsible charge related to this service. Patient Location: [facility] (parked)  Provider Location: Office Additional Individuals present: None  HPI  Uterinemass:seen on CT abdomen/pelvis; has had multiple myomectomies since this was found.  Seeing Dr. Glennon Mac, and had in office follow up 11/09/2019.  She notes her menses has been significantly less heavy.  She is planning to start trying to conceive in March 2021.  Vitamin B12 deficiency:  Taking OTC supplement now.  Recheck in February with Dr. Rogue Bussing.  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  Had recent swelling in the LLE - [place] was negative for DVT.  The swelling has improved significantly since then.  She is seeing Dr. Rogue Bussing ongoing for this.   GERD: She has been having GERD in the middle of the night - working night shift.  We will add famotidine nightly.  Discussed dietary changes as well.   Asthma and Allergies: Taking albuterol PRN and flonase PRN; using flovent daily after having to wear N95 daily for work. 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  We will step up therapy to advair for now.  She is receiving the [employer]  vaccination tomorrow and I am in agreement with this- did advise to ask Dr. Glennon Mac (OB/GYN) regarding how long to wait until after receiving vaccine to start trying to conceive).  Patient Active Problem List   Diagnosis Date Noted  . Intramural and submucous leiomyoma of uterus 08/31/2019  . Menorrhagia with irregular cycle 08/31/2019  . Dysmenorrhea 08/31/2019  . B12 deficiency 08/09/2019  . Cholelithiasis 07/26/2019  . Iron deficiency anemia due to chronic blood loss 06/22/2019  . Acute deep vein thrombosis (DVT) of calf muscle vein of right lower extremity ([place]) 04/18/2019  . Factor V Leiden mutation ([place])   . Allergic rhinitis 03/01/2019  . Kidney stone 08/02/2018  . Asthma, mild intermittent 08/02/2018  . Migraine headache with aura 08/02/2018  . Morbid obesity ([place]) 08/02/2018    Past Surgical History:  Procedure Laterality Date  . CHOLECYSTECTOMY N/A 07/28/2019   Procedure: LAPAROSCOPIC CHOLECYSTECTOMY;  Surgeon: Jules Husbands, MD;  Location: [facility];  Service: General;  Laterality: N/A;  . HYSTEROSCOPY WITH D & C N/A 08/31/2019   Procedure: DILATATION AND CURETTAGE /HYSTEROSCOPY/ SUBMUCOSAL MYOMECTOMY;  Surgeon: Will Bonnet, MD;  Location: [facility];  Service: Gynecology;  Laterality: N/A;  . HYSTEROSCOPY WITH D & C N/A 10/03/2019   Procedure: DILATATION AND CURETTAGE /HYSTEROSCOPY/ SUBMUCOSAL MYOMECTOMY;  Surgeon: Will Bonnet, MD;  Location: [facility];  Service: Gynecology;  Laterality: N/A;  . HYSTEROSCOPY WITH D & C N/A 10/26/2019   Procedure: DILATATION AND CURETTAGE /HYSTEROSCOPY, SUBMUCOSAL MYOMECTOMY;  Surgeon: Will Bonnet, MD;  Location: [facility];  Service: Gynecology;  Laterality: N/A;  . MYOMECTOMY    . TONSILLECTOMY    . WISDOM TOOTH EXTRACTION    . WRIST SURGERY     age 38 - rebroke it b/c it grew back wrong    Family History  Problem Relation Age of Onset  . Heart murmur Mother   . Thyroid disease Mother   . Migraines Mother   . Asthma  Mother   . Diabetes Mother        TYPE 2  . Bipolar disorder Mother   . Breast cancer Maternal Aunt 28  . Cancer Maternal Uncle        skin  . Cancer Maternal Grandmother 29       colon  . Hypertension Maternal Grandmother   . Cancer Maternal Aunt 50       colon  . Diabetes Maternal Aunt     Social History   Socioeconomic History  . Marital status: Married    Spouse name: adam  . Number of children: 0  . Years of education: 95  . Highest education level: Not on file  Occupational History  . 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 Smoking status: Never Smoker  . Smokeless tobacco: Never Used  Substance and Sexual Activity  . Alcohol use: Yes    Comment: monthly  . Drug use: No  . Sexual activity: Not Currently    Birth control/protection: Condom  Other Topics Concern  . 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  No children.       Social Determinants of Health   Financial Resource Strain:   . Difficulty of Paying Living Expenses: Not on file  Food Insecurity:   . Worried About Charity fundraiser in the Last Year: Not on file  . Ran Out of Food in the Last Year: Not on file  Transportation Needs:   . Lack of Transportation (Medical): Not on file  . Lack of Transportation (Non-Medical): Not on file  Physical Activity:   . Days of Exercise per Week: Not on file  . Minutes of Exercise per Session: Not on file  Stress:   . Feeling of Stress : Not on file  Social Connections:   . Frequency of Communication with Friends and Family: Not on file  . Frequency of Social Gatherings with Friends and Family: Not on file  . Attends Religious Services: Not on file  . Active Member of Clubs or Organizations: Not on file  . Attends Archivist Meetings: Not on file  . Marital Status: Not on file  Intimate Partner Violence:   . Fear of Current or Ex-Partner: Not on  file  . Emotionally Abused: Not on file  . Physically Abused: Not on file  . Sexually Abused: Not on file     Current Outpatient Medications:  .  albuterol (PROVENTIL) (2.5 MG/3ML) 0.083% nebulizer solution, Take 3 mLs (2.5 mg total) by nebulization every 6 (six) hours as needed for wheezing or shortness of breath., Disp: 75 mL, Rfl: 12 .  albuterol (VENTOLIN HFA) 108 (90 Base) MCG/ACT inhaler, Inhale 2 puffs into the lungs every 4 (four) hours as needed for wheezing or shortness of breath., Disp: 18 g, Rfl: 3 .  ferrous sulfate 325 (65 FE) MG  tablet, Take 325 mg by mouth daily., Disp: , Rfl:  .  fluticasone (FLONASE) 50 MCG/ACT nasal spray, Place 2 sprays into both nostrils daily., Disp: 16 g, Rfl: 1 .  fluticasone (FLOVENT HFA) 220 MCG/ACT inhaler, Inhale 2 puffs into the lungs 2 (two) times daily., Disp: 1 Inhaler, Rfl: 12 .  Multiple Vitamins-Minerals (MULTIVITAMIN WITH MINERALS) tablet, Take 1 tablet by mouth daily. , Disp: , Rfl:  .  ondansetron (ZOFRAN ODT) 4 MG disintegrating tablet, Take 1 tablet (4 mg total) by mouth every 6 (six) hours as needed for nausea., Disp: 20 tablet, Rfl: 0 .  promethazine (PHENERGAN) 25 MG tablet, Take 1 tablet (25 mg total) by mouth every 8 (eight) hours as needed for nausea or vomiting., Disp: 30 tablet, Rfl: 0 .  rivaroxaban (XARELTO) 20 MG TABS tablet, Take 1 tablet (20 mg total) by mouth daily with supper., Disp: 90 tablet, Rfl: 1 .  Spacer/Aero-Holding Chambers (AEROCHAMBER PLUS) inhaler, Use as instructed, Disp: 1 each, Rfl: 2 .  vitamin B-12 (CYANOCOBALAMIN) 1000 MCG tablet, Take 1 tablet (1,000 mcg total) by mouth daily., Disp: 30 tablet, Rfl: 0 .  vitamin C (ASCORBIC ACID) 500 MG tablet, Take 500 mg by mouth daily., Disp: , Rfl:  .  benzonatate (TESSALON PERLES) 100 MG capsule, Take 1 capsule (100 mg total) by mouth 3 (three) times daily as needed for cough. (Patient not taking: Reported on 11/22/2019), Disp: 30 capsule, Rfl: 1 .   HYDROcodone-acetaminophen (NORCO) 5-325 MG tablet, Take 1 tablet by mouth every 8 (eight) hours as needed (breakthrough pain). (Patient not taking: Reported on 11/22/2019), Disp: 8 tablet, Rfl: 0 .  rivaroxaban (XARELTO) 10 MG TABS tablet, Take 10 mg by mouth daily., Disp: , Rfl:   No Known Allergies  I personally reviewed active problem list, medication list, allergies, notes from last encounter, lab results with the patient/caregiver today.   ROS  Constitutional: Negative for fever or weight change.  Respiratory: Negative for cough and shortness of breath.   Cardiovascular: Negative for chest pain or palpitations.  Gastrointestinal: Negative for abdominal pain, no bowel changes.  Musculoskeletal: Negative for gait problem or joint swelling.  Skin: Negative for rash.  Neurological: Negative for dizziness or headache.  No other specific complaints in a complete review of systems (except as listed in HPI above).   Objective  Virtual encounter, vitals not obtained.  There is no height or weight on file to calculate BMI.  Physical Exam Constitutional: Patient appears well-developed and well-nourished. No distress.  HENT: Head: Normocephalic and atraumatic.  Neck: Normal range of motion. Pulmonary/Chest: Effort normal. No respiratory distress. Speaking in complete sentences Neurological: Pt is alert and oriented to person, place, and time. Coordination, speech are normal.  Psychiatric: Patient has a normal mood and affect. behavior is normal. Judgment and thought content normal.    No results found for this or any previous visit (from the past 72 hour(s)).  PHQ2/9: Depression screen [facility] 2/9 11/22/2019 10/20/2019 09/08/2019 08/01/2019 04/07/2019  Decreased Interest 0 0 0 0 0  Down, Depressed, Hopeless 0 0 0 0 0  PHQ - 2 Score 0 0 0 0 0  Altered sleeping 0 0 0 - 0  Tired, decreased energy 0 0 0 - 0  Change in appetite 0 0 0 - 0  Feeling bad or failure about yourself  0 0 0 - 0    Trouble concentrating 0 0 0 - 0  Moving slowly or fidgety/restless 0 0 0 - 0  Suicidal  thoughts 0 0 0 - 0  PHQ-9 Score 0 0 0 - 0  Difficult doing work/chores Not difficult at all Not difficult at all Not difficult at all - Not difficult at all  Some encounter information is confidential and restricted. Go to Review Flowsheets activity to see all data.   PHQ-2/9 Result is negative.    Fall Risk: Fall Risk  11/22/2019 10/20/2019 09/08/2019 08/01/2019 04/07/2019  Falls in the past year? 1 0 1 1 1   Number falls in past yr: 1 0 1 1 1   Injury with Fall? 0 0 0 0 0  Risk for fall due to : - - - History of fall(s) -  Follow up Falls evaluation completed Falls evaluation completed Falls evaluation completed Falls prevention discussed Falls evaluation completed  Some encounter information is confidential and restricted. Go to Review Flowsheets activity to see all data.    Assessment & Plan  1. Intramural and submucous leiomyoma of uterus 2. Dysmenorrhea - Seeing Dr. Glennon Mac  3. Iron deficiency anemia due to chronic blood loss 4. B12 deficiency 5. Factor V Leiden mutation [facility]) - Seeing Dr. Rogue Bussing; on Xarelto and B12 supplement  6. Allergic rhinitis, unspecified seasonality, unspecified trigger - Flonase  7. Mild intermittent asthma without complication - Step up therapy due to triggers of routine N95 mask use at work and more frequent shortness of breath with this. Has done well on Advair in the past. - Fluticasone-Salmeterol (ADVAIR DISKUS) 250-50 MCG/DOSE AEPB; Inhale 1 puff into the lungs 2 (two) times daily.  Dispense: 3 each; Refill: 3  8. Non-intractable vomiting with nausea, unspecified vomiting type - ondansetron (ZOFRAN ODT) 4 MG disintegrating tablet; Take 1 tablet (4 mg total) by mouth every 6 (six) hours as needed for nausea.  Dispense: 60 tablet; Refill: 1 - promethazine (PHENERGAN) 25 MG tablet; Take 1 tablet (25 mg total) by mouth every 8 (eight) hours as needed for  nausea or vomiting.  Dispense: 60 tablet; Refill: 1  9. Gastroesophageal reflux disease without esophagitis - famotidine (PEPCID) 40 MG tablet; Take 1 tablet (40 mg total) by mouth daily.  Dispense: 90 tablet; Refill: 1   I discussed the assessment and treatment plan with the patient. The patient was provided an opportunity to ask questions and all were answered. The patient agreed with the plan and demonstrated an understanding of the instructions.  The patient was advised to call back or seek an in-person evaluation if the symptoms worsen or if the condition fails to improve as anticipated.  I provided 23 minutes of non-face-to-face time during this encounter.

## 2019-12-04 ENCOUNTER — Telehealth: Payer: Self-pay | Admitting: Family Medicine

## 2019-12-04 ENCOUNTER — Encounter: Payer: Self-pay | Admitting: Family Medicine

## 2019-12-04 DIAGNOSIS — I82461 Acute embolism and thrombosis of right calf muscular vein: Secondary | ICD-10-CM

## 2019-12-04 DIAGNOSIS — D500 Iron deficiency anemia secondary to blood loss (chronic): Secondary | ICD-10-CM

## 2019-12-04 DIAGNOSIS — D6851 Activated protein C resistance: Secondary | ICD-10-CM

## 2019-12-04 NOTE — Telephone Encounter (Signed)
Received this message from Hematology Referral: "I no longer see patients in Hematology Clinic except for those enrolled in sickle cell disease studies. Please contact Lilli Few, Hematology Triage Nurse, for help in arranging for your patient to be seen. M Telen".  Please reach out to arrange alternative referral. Thanks!

## 2019-12-05 NOTE — Telephone Encounter (Signed)
I called to the hematology clinic but was unable to get through so I left a message for Barbaraann Share to give me a call at her earliest convenience.

## 2020-01-03 ENCOUNTER — Other Ambulatory Visit: Payer: 59

## 2020-01-03 ENCOUNTER — Ambulatory Visit: Payer: 59

## 2020-01-03 ENCOUNTER — Ambulatory Visit: Payer: 59 | Admitting: Internal Medicine

## 2020-03-05 ENCOUNTER — Other Ambulatory Visit: Payer: Self-pay

## 2020-03-05 ENCOUNTER — Encounter: Payer: Self-pay | Admitting: Obstetrics and Gynecology

## 2020-03-05 ENCOUNTER — Ambulatory Visit (INDEPENDENT_AMBULATORY_CARE_PROVIDER_SITE_OTHER): Payer: 59 | Admitting: Obstetrics and Gynecology

## 2020-03-05 VITALS — BP 106/75 | HR 89 | Ht 68.0 in | Wt 319.7 lb

## 2020-03-05 DIAGNOSIS — R42 Dizziness and giddiness: Secondary | ICD-10-CM

## 2020-03-05 DIAGNOSIS — Z8742 Personal history of other diseases of the female genital tract: Secondary | ICD-10-CM

## 2020-03-05 DIAGNOSIS — Z3169 Encounter for other general counseling and advice on procreation: Secondary | ICD-10-CM | POA: Diagnosis not present

## 2020-03-05 DIAGNOSIS — D6851 Activated protein C resistance: Secondary | ICD-10-CM | POA: Diagnosis not present

## 2020-03-05 DIAGNOSIS — Z6841 Body Mass Index (BMI) 40.0 and over, adult: Secondary | ICD-10-CM

## 2020-03-05 DIAGNOSIS — N921 Excessive and frequent menstruation with irregular cycle: Secondary | ICD-10-CM

## 2020-03-05 DIAGNOSIS — Z86018 Personal history of other benign neoplasm: Secondary | ICD-10-CM

## 2020-03-05 NOTE — Patient Instructions (Addendum)
Preparing for Pregnancy °If you are considering becoming pregnant, make an appointment to see your regular health care provider to learn how to prepare for a safe and healthy pregnancy (preconception care). During a preconception care visit, your health care provider will: °· Do a complete physical exam, including a Pap test. °· Take a complete medical history. °· Give you information, answer your questions, and help you resolve problems. °Preconception checklist °Medical history °· Tell your health care provider about any current or past medical conditions. Your pregnancy or your ability to become pregnant may be affected by chronic conditions, such as diabetes, chronic hypertension, and thyroid problems. °· Include your family's medical history as well as your partner's medical history. °· Tell your health care provider about any history of STIs (sexually transmitted infections). These can affect your pregnancy. In some cases, they can be passed to your baby. Discuss any concerns that you have about STIs. °· If indicated, discuss the benefits of genetic testing. This testing will show whether there are any genetic conditions that may be passed from you or your partner to your baby. °· Tell your health care provider about: °? Any problems you have had with conception or pregnancy. °? Any medicines you take. These include vitamins, herbal supplements, and over-the-counter medicines. °? Your history of immunizations. Discuss any vaccinations that you may need. °Diet °· Ask your health care provider what to include in a healthy diet that has a balance of nutrients. This is especially important when you are pregnant or preparing to become pregnant. °· Ask your health care provider to help you reach a healthy weight before pregnancy. °? If you are overweight, you may be at higher risk for certain complications, such as high blood pressure, diabetes, and preterm birth. °? If you are underweight, you are more likely to  have a baby who has a low birth weight. °Lifestyle, work, and home °· Let your health care provider know: °? About any lifestyle habits that you have, such as alcohol use, drug use, or smoking. °? About recreational activities that may put you at risk during pregnancy, such as downhill skiing and certain exercise programs. °? Tell your health care provider about any international travel, especially any travel to places with an active Zika virus outbreak. °? About harmful substances that you may be exposed to at work or at home. These include chemicals, pesticides, radiation, or even litter boxes. °? If you do not feel safe at home. °Mental health °· Tell your health care provider about: °? Any history of mental health conditions, including feelings of depression, sadness, or anxiety. °? Any medicines that you take for a mental health condition. These include herbs and supplements. °Home instructions to prepare for pregnancy °Lifestyle ° °· Eat a balanced diet. This includes fresh fruits and vegetables, whole grains, lean meats, low-fat dairy products, healthy fats, and foods that are high in fiber. Ask to meet with a nutritionist or registered dietitian for assistance with meal planning and goals. °· Get regular exercise. Try to be active for at least 30 minutes a day on most days of the week. Ask your health care provider which activities are safe during pregnancy. °· Do not use any products that contain nicotine or tobacco, such as cigarettes and e-cigarettes. If you need help quitting, ask your health care provider. °· Do not drink alcohol. °· Do not take illegal drugs. °· Maintain a healthy weight. Ask your health care provider what weight range is right for you. °General   instructions °· Keep an accurate record of your menstrual periods. This makes it easier for your health care provider to determine your baby's due date. °· Begin taking prenatal vitamins and folic acid supplements daily as directed by your  health care provider. °· Manage any chronic conditions, such as high blood pressure and diabetes, as told by your health care provider. This is important. °How do I know that I am pregnant? °You may be pregnant if you have been sexually active and you miss your period. Symptoms of early pregnancy include: °· Mild cramping. °· Very light vaginal bleeding (spotting). °· Feeling unusually tired. °· Nausea and vomiting (morning sickness). °If you have any of these symptoms and you suspect that you might be pregnant, you can take a home pregnancy test. These tests check for a hormone in your urine (human chorionic gonadotropin, or hCG). A woman's body begins to make this hormone during early pregnancy. These tests are very accurate. Wait until at least the first day after you miss your period to take one. If the test shows that you are pregnant (you get a positive result), call your health care provider to make an appointment for prenatal care. °What should I do if I become pregnant? ° °  ° °· Make an appointment with your health care provider as soon as you suspect you are pregnant. °· Do not use any products that contain nicotine, such as cigarettes, chewing tobacco, and e-cigarettes. If you need help quitting, ask your health care provider. °· Do not drink alcoholic beverages. Alcohol is related to a number of birth defects. °· Avoid toxic odors and chemicals. °· You may continue to have sexual intercourse if it does not cause pain or other problems, such as vaginal bleeding. °This information is not intended to replace advice given to you by your health care provider. Make sure you discuss any questions you have with your health care provider. °Document Revised: 11/11/2017 Document Reviewed: 05/31/2016 °Elsevier Patient Education © 2020 [employer] ° °

## 2020-03-05 NOTE — Progress Notes (Signed)
Pt present for annual exam. Pt stated having PCOS and fibroids with heavy cycles and clots. Pt stated a possible miscarriage but unsure due to her cycles being heavy and having a lot of clotting.  Pt's LMP 02/25/2020. Last pap 06/29/18.

## 2020-03-05 NOTE — Progress Notes (Signed)
GYNECOLOGY PROGRESS NOTE  Subjective:    Patient ID: Tara Shea, female    DOB: [birth year], 39 y.o.   MRN: [id number]  HPI  Patient is a 39 y.o. G0P0000 female who presents for consultation/second opinion regarding fertility.  She has been previously seen by [facility] (Dr. Prentice Docker) and undergone workup and several surgical interventions due to her history of PCOS, uterine fibroids. She also has a history of of DVT with Factor 5 Leiden deficiency (diagnosed 9 years ago).  Patient states that she only attempted conception for one month last year prior to onset of the COVID pandemic, after this she notes that she and her partner decided to hold off on attempts.  Currently on Xarelto for anticoagulation.   She has had a history of fibroids in the past, and has undergone 3 myomectomies within the past year, most recent was performed in December 2020. Notes that after this, she did not have a cycle for 2 months, and then her last 2 cycles have been heavy. Cycles now last 6-7 days (previously 10 days) and most recent cycles were not associated with her usual moderate dysmenorrhea.  She did note feeling some lightheadedness at times last month, is concerned about her iron levels.   Patient overall reports her frustration as she would like to attempt to conceive but feels like she is losing hope. She also reports that her insurance changed and so she attempted to establish with a new Hematologist at [facility] at the beginning of this year, however they noted that they do not have any appointments until December. She knows that she cannot be on Xarelto during pregnancy and desired to be seen regarding this. Is also worried that due to her fibroids she may not be able to conceive.    Gynecologic History: Patient's last menstrual period was 02/25/2020. Last pap smear: 06/29/2018. Results were normal.  History of STI's: Denies   Past Medical History:  Diagnosis Date  . Asthma   . Bone  spur   . Cholelithiasis 07/26/2019  . Complication of anesthesia    pt had hypotensive episode and has had n & v  . DVT (deep venous thrombosis) ([place]) 03/2019   x 1 and DEC 2011  . Factor 5 Leiden mutation, heterozygous ([place])    stopped Xarelto 08/23/19  . Fibroid   . History of kidney stones   . IDA (iron deficiency anemia)    vitamin b12 and iron deficiency  . Migraines   . PCOS (polycystic ovarian syndrome)     Family History  Problem Relation Age of Onset  . Heart murmur Mother   . Thyroid disease Mother   . Migraines Mother   . Asthma Mother   . Diabetes Mother        TYPE 2  . Bipolar disorder Mother   . Breast cancer Maternal Aunt 60  . Cancer Maternal Uncle        skin  . Cancer Maternal Grandmother 15       colon  . Hypertension Maternal Grandmother   . Cancer Maternal Aunt 50       colon  . Diabetes Maternal Aunt    Past Surgical History:  Procedure Laterality Date  . CHOLECYSTECTOMY N/A 07/28/2019   Procedure: LAPAROSCOPIC CHOLECYSTECTOMY;  Surgeon: Jules Husbands, MD;  Location: [facility] ORS;  Service: General;  Laterality: N/A;  . HYSTEROSCOPY WITH D & C N/A 08/31/2019   Procedure: DILATATION AND CURETTAGE /HYSTEROSCOPY/ SUBMUCOSAL MYOMECTOMY;  Surgeon:  Will Bonnet, MD;  Location: [facility] ORS;  Service: Gynecology;  Laterality: N/A;  . HYSTEROSCOPY WITH D & C N/A 10/03/2019   Procedure: DILATATION AND CURETTAGE /HYSTEROSCOPY/ SUBMUCOSAL MYOMECTOMY;  Surgeon: Will Bonnet, MD;  Location: [facility] ORS;  Service: Gynecology;  Laterality: N/A;  . HYSTEROSCOPY WITH D & C N/A 10/26/2019   Procedure: DILATATION AND CURETTAGE /HYSTEROSCOPY, SUBMUCOSAL MYOMECTOMY;  Surgeon: Will Bonnet, MD;  Location: [facility] ORS;  Service: Gynecology;  Laterality: N/A;  . MYOMECTOMY    . TONSILLECTOMY    . WISDOM TOOTH EXTRACTION    . WRIST SURGERY     age 35 - rebroke it b/c it grew back wrong    Social History   Socioeconomic History  . Marital status: Married    Spouse  name: adam  . Number of children: 0  . Years of education: 57  . Highest education level: Not on file  Occupational History  . 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 Smoking status: Never Smoker  . Smokeless tobacco: Never Used  Substance and Sexual Activity  . Alcohol use: Yes    Comment: monthly  . Drug use: No  . Sexual activity: Yes    Birth control/protection: Condom  Other Topics Concern  . 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  No children.       Social Determinants of Health   Financial Resource Strain:   . Difficulty of Paying Living Expenses:   Food Insecurity:   . Worried About Charity fundraiser in the Last Year:   . Arboriculturist in the Last Year:   Transportation Needs:   . Film/video editor (Medical):   Marland Kitchen Lack of Transportation (Non-Medical):   Physical Activity:   . Days of Exercise per Week:   . Minutes of Exercise per Session:   Stress:   . Feeling of Stress :   Social Connections:   . Frequency of Communication with Friends and Family:   . Frequency of Social Gatherings with Friends and Family:   . Attends Religious Services:   . Active Member of Clubs or Organizations:   . Attends Archivist Meetings:   Marland Kitchen Marital Status:   Intimate Partner Violence:   . Fear of Current or Ex-Partner:   . Emotionally Abused:   Marland Kitchen Physically Abused:   . Sexually Abused:     Current Outpatient Medications on File Prior to Visit  Medication Sig Dispense Refill  . albuterol (PROVENTIL) (2.5 MG/3ML) 0.083% nebulizer solution Take 3 mLs (2.5 mg total) by nebulization every 6 (six) hours as needed for wheezing or shortness of breath. 75 mL 12  . albuterol (VENTOLIN HFA) 108 (90 Base) MCG/ACT inhaler Inhale 2 puffs into the lungs every 4 (four) hours as needed for wheezing or shortness of breath. 18 g 3  . ferrous sulfate 325 (65 FE) MG tablet Take  325 mg by mouth daily.    . Fluticasone-Salmeterol (ADVAIR DISKUS) 250-50 MCG/DOSE AEPB Inhale 1 puff into the lungs 2 (two) times daily. 3 each 3  . Multiple Vitamins-Minerals (MULTIVITAMIN WITH MINERALS) tablet Take 1 tablet by mouth daily.     . ondansetron (ZOFRAN ODT) 4 MG disintegrating tablet Take 1 tablet (4 mg total) by mouth every 6 (six) hours as needed for nausea. 60 tablet 1  .  promethazine (PHENERGAN) 25 MG tablet Take 1 tablet (25 mg total) by mouth every 8 (eight) hours as needed for nausea or vomiting. 60 tablet 1  . rivaroxaban (XARELTO) 20 MG TABS tablet Take 1 tablet (20 mg total) by mouth daily with supper. 90 tablet 1  . Spacer/Aero-Holding Chambers (AEROCHAMBER PLUS) inhaler Use as instructed 1 each 2  . vitamin B-12 (CYANOCOBALAMIN) 1000 MCG tablet Take 1 tablet (1,000 mcg total) by mouth daily. 30 tablet 0  . vitamin C (ASCORBIC ACID) 500 MG tablet Take 500 mg by mouth daily.    . rivaroxaban (XARELTO) 10 MG TABS tablet Take 10 mg by mouth daily.     No current facility-administered medications on file prior to visit.    She has No Known Allergies.   Review of Systems Constitutional: negative for chills, fatigue, fevers and sweats Eyes: negative for irritation, redness and visual disturbance Ears, nose, mouth, throat, and face: negative for hearing loss, nasal congestion, snoring and tinnitus Respiratory: negative for asthma, cough, sputum Cardiovascular: negative for chest pain, dyspnea, exertional chest pressure/discomfort, irregular heart beat, palpitations and syncope Gastrointestinal: negative for abdominal pain, change in bowel habits, nausea and vomiting Genitourinary: negative for abnormal menstrual periods, genital lesions, sexual problems and vaginal discharge, dysuria and urinary incontinence Integument/breast: negative for breast lump, breast tenderness and nipple discharge Hematologic/lymphatic: negative for bleeding and easy  bruising Musculoskeletal:negative for back pain and muscle weakness Neurological: negative for dizziness, headaches, vertigo and weakness Endocrine: negative for diabetic symptoms including polydipsia, polyuria and skin dryness Allergic/Immunologic: negative for hay fever and urticaria      Objective:   Blood pressure 106/75, pulse 89, height 5\' 8"  (1.727 m), weight (!) 319 lb 11.2 oz (145 kg), last menstrual period 02/25/2020. Body mass index is 48.61 kg/m. General appearance: alert and no distress  CVS exam: normal rate, regular rhythm, normal S1, S2, no murmurs, rubs, clicks or gallops. Lungs:  Normal respiratory effort, chest expands symmetrically. Lungs are clear to auscultation, no crackles or wheezes. Abdomen: soft, non-tender; bowel sounds normal; no masses,  no organomegaly Pelvic: deferred Extremities: extremities normal, atraumatic, no cyanosis or edema Neurologic: Grossly normal    Labs:   CBC Latest Ref Rng & Units 09/05/2019  WBC 3.4 - 10.8 x10E3/uL 6.2  Hemoglobin 11.1 - 15.9 g/dL 12.3  Hematocrit 34.0 - 46.6 % 38.5  Platelets 150 - 450 x10E3/uL 259    Lab Results  Component Value Date   HGBA1C 4.7 (L) 07/28/2019   Lab Results  Component Value Date   TSH 2.600 06/29/2018    Imaging:  Patient Name: KINSLEE BURKINS DOB: 28-Jun-1981 MRN: [id number]  ULTRASOUND REPORT  Location: [facility]  Date of Service: 08/11/2019     Indications:Enlarged Uterus with submucosal mass. SIS performed.  Findings:   The uterus is anteverted and measures 13.3 x 9.1 x 8.9 cm. Echo texture is heterogenous with evidence of focal masses.   Within the uterus are multiple suspected fibroids measuring: Fibroid 1: 70 x 53 x 55 mm greater than 50% submucosal Fibroid 2: 13 x 13 x 16 mm intramural anterior  The Endometrium measures 2.6 mm.  Right Ovary measures 2.9 x 1.8 x 2.1 cm. It is normal in appearance. Left Ovary is not visualized. Survey of the adnexa  demonstrates no adnexal masses. There is no free fluid in the cul de sac.  Impression: 1. There is a large submucosal fibroid seen.  2. The endometrial lining appears thin 3. Normal appearing right ovary 4.  A Saline Infusion Sonohystegram was performed today highlighting the above findings.  See procedure note.   Gweneth Dimitri, RT   The ultrasound images and findings were reviewed by me and I agree with the above report.  Prentice Docker, MD, [facility], [facility] 08/11/2019 2:28 PM     Assessment:   1. Encounter for preconception consultation   2. Dizziness   3. History of PCOS   4. Factor 5 Leiden mutation, heterozygous ([place])   5. Menorrhagia with irregular cycle   6. History of uterine fibroid   7. Morbid obesity with BMI of 45.0-49.9, adult ([place])     Plan:   1. Discussion had with patient regarding preconception.  She has several concerning factors regarding her fertility, including her obesity, history of PCOS, history of factor V Leiden mutation and DVT currently on anticoagulants, and history of fibroids.  Patient also with cycle irregularity since her surgery in December, however cycles have occurred for the past 2 months albeit heavier than normal.  However, patient also has not attempted to conceive for very long so it is unclear if she will require assistance at this time.  As patient has a history of PCOS will check her hormone levels as well as an AMH to assess if fertility medications will be required.  Also encourage patient to optimize her health with regards to her weight.  Most recent hemoglobin A1c was normal, patient not at risk for prediabetes in light of her history of PCOS.  Also she should begin taking a prenatal vitamin with additional folic acid.  Also encourage the use of ovulation kits and fertility tracking using an app or calendar. 2.  We will check patient's hemoglobin levels in light of most recent menstrual cycle being  heavy and her complaints of dizziness.  Patient does have a history in the past of anemia due to her cycles. 3.  Patient vents frustrations regarding not being able to be seen by a hematologist.  I discussed that she does have the option to reach out to her previous hematologist to see if she could be seen any earlier than her current December appointment with the new hematologist at [facility].  Advised that the appointment may come with more out-of-pocket expense as she is no longer within network with her previous hematologist however this may be a a benefit to her in the long run.  Patient notes that she will reach out to her previous hematologist for an appointment.  4.  History of uterine fibroids -patient has had a history of intramural and submucosal fibroids.  Has undergone at least 3 myomectomies for removal of the submucosal fibroids.  Patient still concerned that she may still have some fibroids present.  Advise as long as the fibroids are not within the uterine cavity (i.e submucosal) that may should not interfere with conception.  Patient notes understanding. 5. Patient to return after follow-up with her hematologist to further discuss her attempts for conception.  She will call to schedule her next appointment.  Also advised that if her next cycle is longer or heavier than normal that she could contact the office and could be prescribed medication such as Provera to help with her cycles each month until conception occurs.  Is not a candidate for use of tranexamic acid due to her history of DVT.     A total of 30 minutes were spent face-to-face with the patient during the encounter with greater than 50% dealing with counseling  and coordination of care, and review of previous records and labs.   Rubie Maid, MD [facility]

## 2020-03-06 ENCOUNTER — Other Ambulatory Visit: Payer: 59

## 2020-03-10 ENCOUNTER — Encounter: Payer: Self-pay | Admitting: Obstetrics and Gynecology

## 2020-03-10 LAB — CBC
Hematocrit: 30.4 % — ABNORMAL LOW (ref 34.0–46.6)
Hemoglobin: 9.5 g/dL — ABNORMAL LOW (ref 11.1–15.9)
MCH: 27.5 pg (ref 26.6–33.0)
MCHC: 31.3 g/dL — ABNORMAL LOW (ref 31.5–35.7)
MCV: 88 fL (ref 79–97)
Platelets: 342 10*3/uL (ref 150–450)
RBC: 3.45 x10E6/uL — ABNORMAL LOW (ref 3.77–5.28)
RDW: 13.4 % (ref 11.7–15.4)
WBC: 4.6 10*3/uL (ref 3.4–10.8)

## 2020-03-10 LAB — FSH/LH
FSH: 5.7 m[IU]/mL
LH: 5.8 m[IU]/mL

## 2020-03-10 LAB — ANTI MULLERIAN HORMONE: ANTI-MULLERIAN HORMONE (AMH): 0.45 ng/mL

## 2020-03-10 LAB — PROGESTERONE: Progesterone: 0.1 ng/mL

## 2020-03-10 LAB — ESTRADIOL: Estradiol: 105 pg/mL

## 2020-03-20 ENCOUNTER — Encounter: Payer: Self-pay | Admitting: Internal Medicine

## 2020-03-20 ENCOUNTER — Inpatient Hospital Stay: Payer: 59 | Attending: Internal Medicine | Admitting: Internal Medicine

## 2020-03-20 ENCOUNTER — Other Ambulatory Visit: Payer: Self-pay

## 2020-03-20 DIAGNOSIS — I82461 Acute embolism and thrombosis of right calf muscular vein: Secondary | ICD-10-CM

## 2020-03-20 DIAGNOSIS — D500 Iron deficiency anemia secondary to blood loss (chronic): Secondary | ICD-10-CM | POA: Diagnosis not present

## 2020-03-20 DIAGNOSIS — N920 Excessive and frequent menstruation with regular cycle: Secondary | ICD-10-CM | POA: Diagnosis not present

## 2020-03-20 DIAGNOSIS — D6851 Activated protein C resistance: Secondary | ICD-10-CM | POA: Diagnosis not present

## 2020-03-20 DIAGNOSIS — Z7901 Long term (current) use of anticoagulants: Secondary | ICD-10-CM | POA: Diagnosis not present

## 2020-03-20 MED ORDER — ENOXAPARIN SODIUM 120 MG/0.8ML ~~LOC~~ SOLN: 120.0000 mg | Freq: Two times a day (BID) | SUBCUTANEOUS | 6 refills | Status: DC

## 2020-03-20 NOTE — Progress Notes (Signed)
Pt in for follow up, reports wanting to start trying to get pregnant and ob/gyn told her that her hematologist would need to change her to lovenox.  Pt having insurance issues as well, has through husband.

## 2020-03-20 NOTE — Progress Notes (Signed)
[facility] CONSULT NOTE  Patient Care Team: Hubbard Hartshorn, FNP as PCP - General (Family Medicine)  CHIEF COMPLAINTS/PURPOSE OF CONSULTATION: DVT/PE  # provoked DVT in 2011 [lesser saphenous vein left lower extremity/below calf; BCP/long car ride x 6 months anticoagulation]; Apr 07, 2019-unprovoked right lower extremity DVT nonocclusive-Xarelto x25m; October 2020-factor V Leiden heterozygous; Xarelto 10 mg/day.  Mar 23, 2020-recommend Lovenox 120 mg twice daily [plans to get pregnant]  #Hypercoagulable state-2011?  Factor V Leiden heterozygosity [as per patient; [facility]]-no records  #June 2020-anemia likely iron deficiency/menstrual periods.  Oncology History   No history exists.    HISTORY OF PRESENTING ILLNESS:  Tara Shea 39 y.o.  female history of recurrent DVT right lower extremity; factor V Leiden heterozygous is here for follow-up.  Patient is currently on Xarelto given her recent fibroid surgery.  Patient is planned to get pregnant; and wants to come off of Xarelto.  Patient is awaiting to see hematology at [facility] the next 6 month-because of insurance reasons.  Otherwise denies any new blood clots but denies any headaches nausea vomiting.  Review of Systems  Constitutional: Positive for malaise/fatigue. Negative for chills, diaphoresis, fever and weight loss.  HENT: Negative for nosebleeds and sore throat.   Eyes: Negative for double vision.  Respiratory: Negative for cough, hemoptysis, sputum production, shortness of breath and wheezing.   Cardiovascular: Negative for chest pain, palpitations, orthopnea and leg swelling.  Gastrointestinal: Negative for abdominal pain, blood in stool, constipation, diarrhea, heartburn, melena, nausea and vomiting.  Genitourinary: Negative for dysuria, frequency and urgency.  Musculoskeletal: Negative for back pain and joint pain.  Skin: Negative.  Negative for itching and rash.  Neurological: Negative for  dizziness, tingling, focal weakness, weakness and headaches.  Endo/Heme/Allergies: Does not bruise/bleed easily.  Psychiatric/Behavioral: Negative for depression. The patient is not nervous/anxious and does not have insomnia.      MEDICAL HISTORY:  Past Medical History:  Diagnosis Date  . Asthma   . Bone spur   . Cholelithiasis 07/26/2019  . Complication of anesthesia    pt had hypotensive episode and has had n & v  . DVT (deep venous thrombosis) ([place]) 03/2019   x 1 and DEC 2011  . Factor 5 Leiden mutation, heterozygous ([place])    stopped Xarelto 08/23/19  . Fibroid   . History of kidney stones   . IDA (iron deficiency anemia)    vitamin b12 and iron deficiency  . Migraines   . PCOS (polycystic ovarian syndrome)   . PCOS (polycystic ovarian syndrome)     SURGICAL HISTORY: Past Surgical History:  Procedure Laterality Date  . CHOLECYSTECTOMY N/A 07/28/2019   Procedure: LAPAROSCOPIC CHOLECYSTECTOMY;  Surgeon: Jules Husbands, MD;  Location: [facility] ORS;  Service: General;  Laterality: N/A;  . HYSTEROSCOPY WITH D & C N/A 08/31/2019   Procedure: DILATATION AND CURETTAGE /HYSTEROSCOPY/ SUBMUCOSAL MYOMECTOMY;  Surgeon: Will Bonnet, MD;  Location: [facility] ORS;  Service: Gynecology;  Laterality: N/A;  . HYSTEROSCOPY WITH D & C N/A 10/03/2019   Procedure: DILATATION AND CURETTAGE /HYSTEROSCOPY/ SUBMUCOSAL MYOMECTOMY;  Surgeon: Will Bonnet, MD;  Location: [facility] ORS;  Service: Gynecology;  Laterality: N/A;  . HYSTEROSCOPY WITH D & C N/A 10/26/2019   Procedure: DILATATION AND CURETTAGE /HYSTEROSCOPY, SUBMUCOSAL MYOMECTOMY;  Surgeon: Will Bonnet, MD;  Location: [facility] ORS;  Service: Gynecology;  Laterality: N/A;  . MYOMECTOMY    . TONSILLECTOMY    . WISDOM TOOTH EXTRACTION    . WRIST SURGERY  age 86 - rebroke it b/c it grew back wrong    SOCIAL HISTORY: Social History   Socioeconomic History  . Marital status: Married    Spouse name: adam  . Number of children: 0  . Years of  education: 74  . Highest education level: Not on file  Occupational History  . 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 Smoking status: Never Smoker  . Smokeless tobacco: Never Used  Substance and Sexual Activity  . Alcohol use: Yes    Comment: monthly  . Drug use: No  . Sexual activity: Yes    Birth control/protection: Condom  Other Topics Concern  . 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  No children.       Social Determinants of Health   Financial Resource Strain:   . Difficulty of Paying Living Expenses:   Food Insecurity:   . Worried About Charity fundraiser in the Last Year:   . Arboriculturist in the Last Year:   Transportation Needs:   . Film/video editor (Medical):   Marland Kitchen Lack of Transportation (Non-Medical):   Physical Activity:   . Days of Exercise per Week:   . Minutes of Exercise per Session:   Stress:   . Feeling of Stress :   Social Connections:   . Frequency of Communication with Friends and Family:   . Frequency of Social Gatherings with Friends and Family:   . Attends Religious Services:   . Active Member of Clubs or Organizations:   . Attends Archivist Meetings:   Marland Kitchen Marital Status:   Intimate Partner Violence:   . Fear of Current or Ex-Partner:   . Emotionally Abused:   Marland Kitchen Physically Abused:   . Sexually Abused:     FAMILY HISTORY: Family History  Problem Relation Age of Onset  . Heart murmur Mother   . Thyroid disease Mother   . Migraines Mother   . Asthma Mother   . Diabetes Mother        TYPE 2  . Bipolar disorder Mother   . Breast cancer Maternal Aunt 15  . Cancer Maternal Uncle        skin  . Cancer Maternal Grandmother 16       colon  . Hypertension Maternal Grandmother   . Cancer Maternal Aunt 50       colon  . Diabetes Maternal Aunt     ALLERGIES:  has No Known Allergies.  MEDICATIONS:  Current Outpatient  Medications  Medication Sig Dispense Refill  . albuterol (PROVENTIL) (2.5 MG/3ML) 0.083% nebulizer solution Take 3 mLs (2.5 mg total) by nebulization every 6 (six) hours as needed for wheezing or shortness of breath. 75 mL 12  . albuterol (VENTOLIN HFA) 108 (90 Base) MCG/ACT inhaler Inhale 2 puffs into the lungs every 4 (four) hours as needed for wheezing or shortness of breath. 18 g 3  . ferrous sulfate 325 (65 FE) MG tablet Take 325 mg by mouth daily.    . fluconazole (DIFLUCAN) 150 MG tablet Take 150 mg by mouth once.    . Fluticasone-Salmeterol (ADVAIR DISKUS) 250-50 MCG/DOSE AEPB Inhale 1 puff into the lungs 2 (two) times daily. 3 each 3  . Multiple Vitamins-Minerals (MULTIVITAMIN WITH MINERALS) tablet Take 1 tablet by mouth daily.     . ondansetron (  ZOFRAN ODT) 4 MG disintegrating tablet Take 1 tablet (4 mg total) by mouth every 6 (six) hours as needed for nausea. 60 tablet 1  . promethazine (PHENERGAN) 25 MG tablet Take 1 tablet (25 mg total) by mouth every 8 (eight) hours as needed for nausea or vomiting. 60 tablet 1  . rivaroxaban (XARELTO) 20 MG TABS tablet Take 1 tablet (20 mg total) by mouth daily with supper. 90 tablet 1  . Spacer/Aero-Holding Chambers (AEROCHAMBER PLUS) inhaler Use as instructed 1 each 2  . vitamin B-12 (CYANOCOBALAMIN) 1000 MCG tablet Take 1 tablet (1,000 mcg total) by mouth daily. 30 tablet 0  . vitamin C (ASCORBIC ACID) 500 MG tablet Take 500 mg by mouth daily.    Marland Kitchen enoxaparin (LOVENOX) 120 MG/0.8ML injection Inject 0.8 mLs (120 mg total) into the skin every 12 (twelve) hours. 48 mL 6  . rivaroxaban (XARELTO) 10 MG TABS tablet Take 10 mg by mouth daily.     No current facility-administered medications for this visit.      Marland Kitchen  PHYSICAL EXAMINATION:  Vitals:   03/20/20 1049  BP: 112/66  Pulse: 81  Resp: 18  Temp: (!) 96.4 F (35.8 C)  SpO2: 99%   Filed Weights   03/20/20 1049  Weight: (!) 318 lb 9.6 oz (144.5 kg)    Physical Exam  Constitutional:  She is oriented to person, place, and time and well-developed, well-nourished, and in no distress.  Obese.  HENT:  Head: Normocephalic and atraumatic.  Mouth/Throat: Oropharynx is clear and moist. No oropharyngeal exudate.  Eyes: Pupils are equal, round, and reactive to light.  Cardiovascular: Normal rate and regular rhythm.  Pulmonary/Chest: Effort normal and breath sounds normal. No respiratory distress. She has no wheezes.  Abdominal: Soft. Bowel sounds are normal. She exhibits no distension and no mass. There is no abdominal tenderness. There is no rebound and no guarding.  Musculoskeletal:        General: No tenderness or edema. Normal range of motion.     Cervical back: Normal range of motion and neck supple.  Neurological: She is alert and oriented to person, place, and time.  Skin: Skin is warm.  Psychiatric: Affect normal.    LABORATORY DATA:  I have reviewed the data as listed Lab Results  Component Value Date   WBC 4.6 03/06/2020   HGB 9.5 (L) 03/06/2020   HCT 30.4 (L) 03/06/2020   MCV 88 03/06/2020   PLT 342 03/06/2020   Recent Labs    07/27/19 0517 07/27/19 0517 07/28/19 0747 07/29/19 0618 09/05/19 1432  NA 140   < > 142 142 140  K 3.7   < > 3.8 3.6 4.1  CL 108   < > 112* 112* 103  CO2 25   < > 23 23 24   GLUCOSE 90   < > 85 114* 114*  BUN 12   < > 7 8 18   CREATININE 0.73   < > 0.72 0.74 0.80  CALCIUM 8.4*   < > 8.5* 8.6* 9.6  GFRNONAA >60   < > >60 >60 94  GFRAA >60   < > >60 >60 108  PROT 6.1*  --  6.2*  --  7.0  ALBUMIN 3.3*  --  3.2*  --  4.1  AST 38  --  22  --  11  ALT 47*  --  33  --  13  ALKPHOS 58  --  49  --  72  BILITOT 0.4  --  0.5  --  <0.2   < > = values in this interval not displayed.    RADIOGRAPHIC STUDIES: I have personally reviewed the radiological images as listed and agreed with the findings in the report. No results found.  ASSESSMENT & PLAN:   Acute deep vein thrombosis (DVT) of calf muscle vein of right lower  extremity #RECURRENT  DVT [last may 2020] of the right lower extremity-[unprovoked DVT] calf veins small clot burden s/p Xarelto 3 months.  Factor V Leiden heterozygous.  #Patient plan to get pregnant; recommend discontinue Xarelto.  Would recommend Lovenox therapeutic 120 mg twice daily dosing.  Patient should be on the therapeutic dose continue to time of delivery.   #Iron deficient anemia-secondary heavy menstrual periods/fibroids; 9.4; on PO vitron C; hold off IV iron.   # DISPOSITION:  # follow up as needed [pt pref/insurance]-Dr.B  Cc; Uvaldo Rising  All questions were answered. The patient knows to call the clinic with any problems, questions or concerns.     Cammie Sickle, MD 03/26/2020 7:47 PM

## 2020-03-20 NOTE — Assessment & Plan Note (Addendum)
#  RECURRENT  DVT [last may 2020] of the right lower extremity-[unprovoked DVT] calf veins small clot burden s/p Xarelto 3 months.  Factor V Leiden heterozygous.  #Patient plan to get pregnant; recommend discontinue Xarelto.  Would recommend Lovenox therapeutic 120 mg twice daily dosing.  Patient should be on the therapeutic dose continue to time of delivery.   #Iron deficient anemia-secondary heavy menstrual periods/fibroids; 9.4; on PO vitron C; hold off IV iron.   # DISPOSITION:  # follow up as needed [pt pref/insurance]-Dr.B  Cc; [facility] [place]

## 2020-03-27 ENCOUNTER — Ambulatory Visit (INDEPENDENT_AMBULATORY_CARE_PROVIDER_SITE_OTHER): Payer: 59 | Admitting: Obstetrics and Gynecology

## 2020-03-27 ENCOUNTER — Other Ambulatory Visit: Payer: Self-pay

## 2020-03-27 ENCOUNTER — Encounter: Payer: Self-pay | Admitting: Obstetrics and Gynecology

## 2020-03-27 VITALS — BP 110/76 | HR 79 | Ht 68.0 in | Wt 320.3 lb

## 2020-03-27 DIAGNOSIS — N920 Excessive and frequent menstruation with regular cycle: Secondary | ICD-10-CM | POA: Diagnosis not present

## 2020-03-27 DIAGNOSIS — Z6841 Body Mass Index (BMI) 40.0 and over, adult: Secondary | ICD-10-CM

## 2020-03-27 DIAGNOSIS — D500 Iron deficiency anemia secondary to blood loss (chronic): Secondary | ICD-10-CM | POA: Diagnosis not present

## 2020-03-27 DIAGNOSIS — E282 Polycystic ovarian syndrome: Secondary | ICD-10-CM

## 2020-03-27 DIAGNOSIS — D6851 Activated protein C resistance: Secondary | ICD-10-CM | POA: Diagnosis not present

## 2020-03-27 DIAGNOSIS — Z86018 Personal history of other benign neoplasm: Secondary | ICD-10-CM

## 2020-03-27 MED ORDER — LETROZOLE 2.5 MG PO TABS: 2.5000 mg | ORAL_TABLET | Freq: Every day | ORAL | 0 refills | Status: DC

## 2020-03-27 NOTE — Progress Notes (Signed)
Pt present to follow up to discuss pcos issues. Pt stated that she was doing well.

## 2020-03-27 NOTE — Progress Notes (Signed)
    GYNECOLOGY PROGRESS NOTE  Subjective:    Patient ID: Tara Shea, female    DOB: February 09, 1981, 39 y.o.   MRN: [id number]  HPI  Patient is a 39 y.o. G0P0000 female who presents for follow up of PCOS and fertility.  She has a past history of uterine fibroids (s/p hysteroscopic myomectomy x 2), h/o DVT, h/o Factor V Leiden deficiency, anemia, and menorrhagia.  Also has a past history of irregular cycles, but these have normalized recently.   Patient reports that she was able to be seen by her previous Hematologist, and was able to switch from Xarelto to the Lovenox.  Has been on this regimen since last week. No major issues at this time. Advised her to continue her PO iron for her anemia. Currently taking once daily.   She reports that her menstrual cycle started Saturday. The cycle was heavy, using tampon every hour for the first day but then lightens up to using a tampon every 3-4 hours.    The following portions of the patient's history were reviewed and updated as appropriate: allergies, current medications, past family history, past medical history, past social history, past surgical history and problem list.  Review of Systems Pertinent items noted in HPI and remainder of comprehensive ROS otherwise negative.   Objective:   Blood pressure 110/76, pulse 79, height 5\' 8"  (1.727 m), weight (!) 320 lb 4.8 oz (145.3 kg), last menstrual period 03/23/2020. General appearance: alert and no distress Remainder of exam deferred.    Assessment:   1. PCOS (polycystic ovarian syndrome)   2. Factor V Leiden mutation ([place])   3. Menorrhagia with regular cycle   4. Iron deficiency anemia due to chronic blood loss   5. Morbid obesity with BMI of 45.0-49.9, adult ([place])     Plan:   1. H/o PCOS - patient has had labs within the past year, no insulin resistance noted. Was having irregular cycles which have now normalized. Discussed PCOS and weight as barriers to fertility.  Now that  cycles have normalized, can consider use of ovulation induction medications due to age. Advised that the medications tend to work better if her health and weight were optimized.  She notes that she has been using her in-home elliptical every other day for 20 minutes. Notes dietary-wise, her meals are overall healthy when she cooks at home, however when at work, she reports bad dietary habits as she is often "just grabbing something quick".  Discussed meal prepping/planning to encourage healthier intake dur ing the day. Also advised on increasing activity on elliptical to 30 minutes. Will prescribe Femara. Given instructions on use.  2. Factor V Leiden mutation and h/o DVT, currently on Lovenox (recently transitioned from Xarelto).  Patient to continue on Lovenox and can continue during pregnancy with dose appropriate changes as needed per trimester.  3. Menorrhagia with iron deficiency anemia - patient currently taking iron supplement daily, encouraged to increase to BID.    Patient to f/u in 3 months.   A total of 15 minutes were spent face-to-face with the patient during this encounter and over half of that time dealt with counseling and coordination of care.    Rubie Maid, MD [facility]

## 2020-03-27 NOTE — Patient Instructions (Addendum)
[facility] PATIENT INSTRUCTIONS  WHY USE IT? Femara helps your ovaries to release eggs (ovulate).  HOW TO USE IT? Femara is taken as a pill usually on days 5,6,7,8, & 9 of your cycle.  Day 1 is the first day of your period. The dose or duration may be changed to achieve ovulation.  Provera (progesterone) may first be used to bring on a period for some patients.   If you do not get pregnant this cycle, for your next cycles, take on days 1, 2, 3, 4 and 5.  If you do not get a period, take Provera 10 mg daily for 10 days to bring on a period; the first day you get bleeding is Day 1 of your cycle.  The day of ovulation on Femara is usually between cycle day 14 and 17.  Having sexual intercourse at least every other day between cycle day 13 and 18 will improve your chances of becoming pregnant during the Femara cycle.  You may monitor your ovulation using basal body temperature charts or with ovulation kits.  If using the ovulation predictor kits, having intercourse the day of the surge and the two days following is recommended. If you get your period, call when it starts for an appointment with your doctor, so that an exam may be done, and another Femara cycle can be considered if appropriate. If you do not get a period by day 35 of the cycle, please get a blood pregnancy test.  If it is negative, speak to your doctor for instructions to bring on another period and to plan a follow-up appointment.  THINGS TO KNOW: If you get pregnant while using Femara , your chance of twins is 7% and triplets is less than 1%. Some studies have suggested the use of "fertility drugs" may increase your risk of ovarian cancers in the future.  It is unclear if these drugs increase the risk, or people who have problems with fertility are prone for these cancers.  If there is an actual risk, it is very low.  If you have a history of liver problems or ovarian cancer, it may be wise to avoid this medication.  SIDE EFFECTS:  The  most common side effect is hot flashes (20%).  Breast tenderness, headaches, nausea, bloating may also occur at different times.  Less than 3/1,000 people have dryness or loss of hair.  Persistent ovarian cysts may form from the use of this medication.  Ovarian hyperstimulation syndrome is a rare side effect at low doses.  Visual changes like flashes of light or blurring.

## 2020-06-14 ENCOUNTER — Other Ambulatory Visit: Payer: Self-pay | Admitting: Obstetrics and Gynecology

## 2020-06-25 DIAGNOSIS — M65962 Unspecified synovitis and tenosynovitis, left lower leg: Secondary | ICD-10-CM | POA: Insufficient documentation

## 2020-06-25 DIAGNOSIS — M659 Synovitis and tenosynovitis, unspecified: Secondary | ICD-10-CM | POA: Insufficient documentation

## 2020-07-02 ENCOUNTER — Ambulatory Visit: Payer: 59 | Admitting: Obstetrics and Gynecology

## 2020-07-05 IMAGING — US US PELVIS COMPLETE WITH TRANSVAGINAL
1 series · 13 of 25 positions shown · non-contrast
Comparison: CT 07/26/2019

CLINICAL DATA: Vomiting.  Follow-up abnormal CT from 07/26/2019.

EXAM:
TRANSABDOMINAL AND TRANSVAGINAL ULTRASOUND OF PELVIS
TECHNIQUE: Both transabdominal and transvaginal ultrasound examinations of the
pelvis were performed. Transabdominal technique was performed for
global imaging of the pelvis including uterus, ovaries, adnexal
regions, and pelvic cul-de-sac. It was necessary to proceed with
endovaginal exam following the transabdominal exam to visualize the
uterus and endometrium.

[Series 1: us pelvis complete with transvaginal · 13 of 95 slices shown]
[im 1/95]
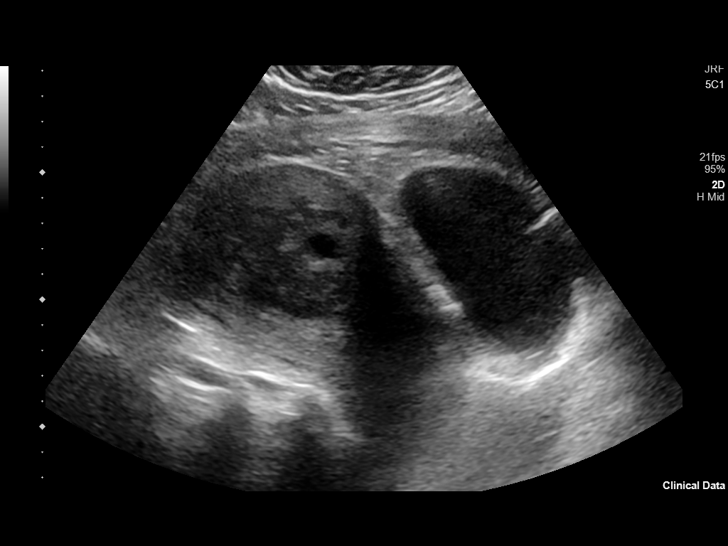
[im 8/95]
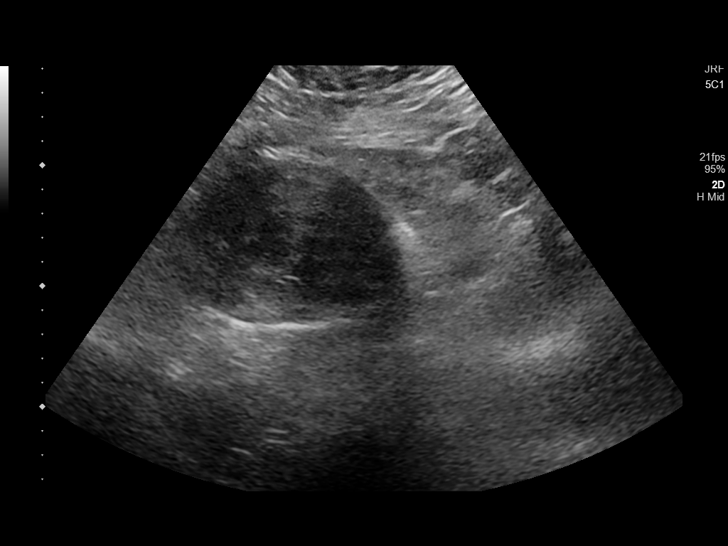
[im 16/95]
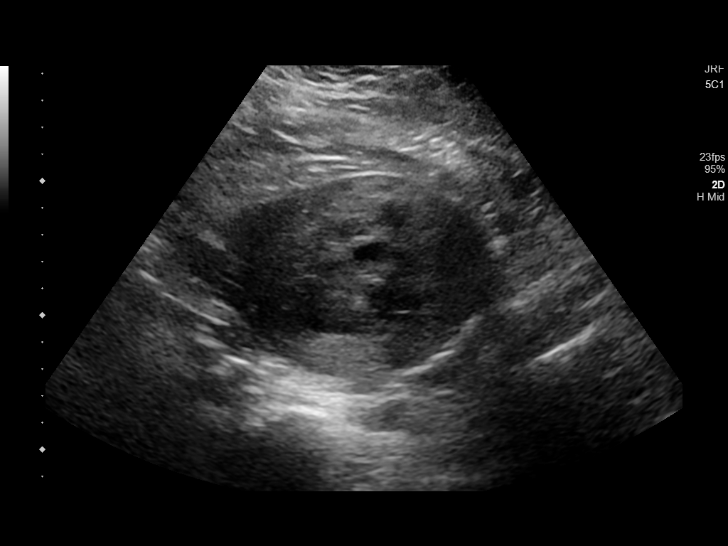
[im 24/95]
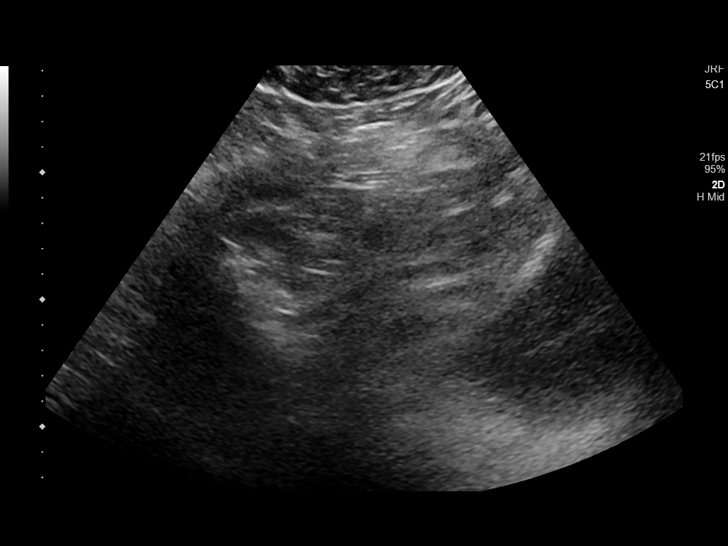
[im 32/95]
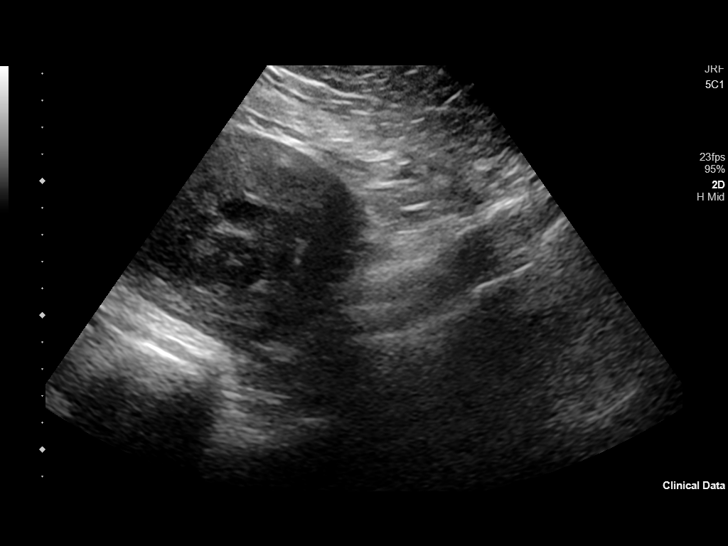
[im 40/95]
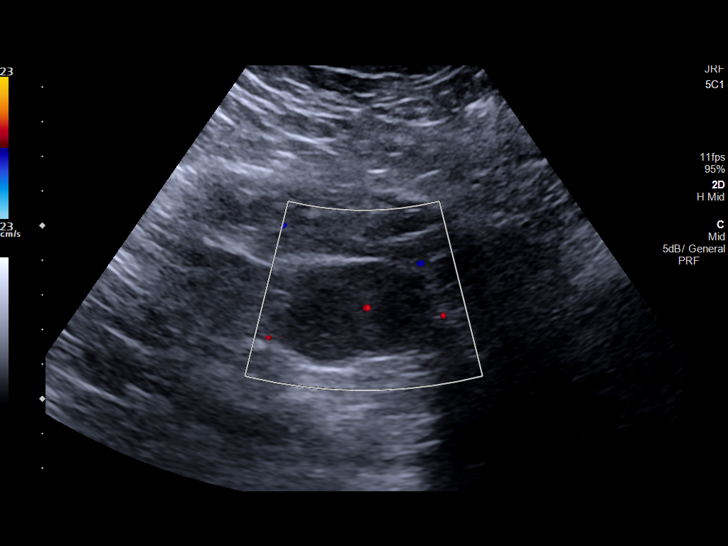
[im 48/95]
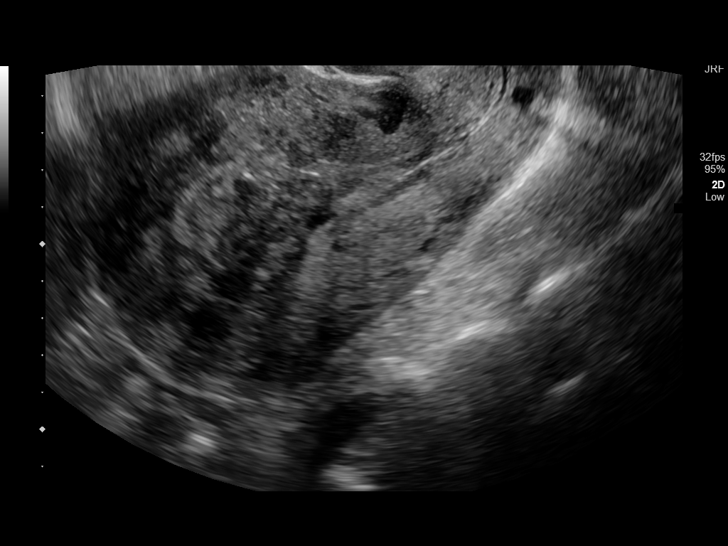
[im 55/95]
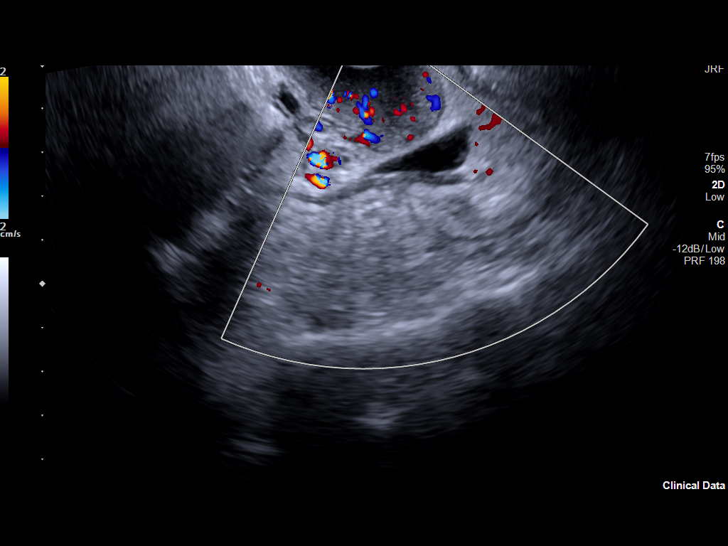
[im 63/95]
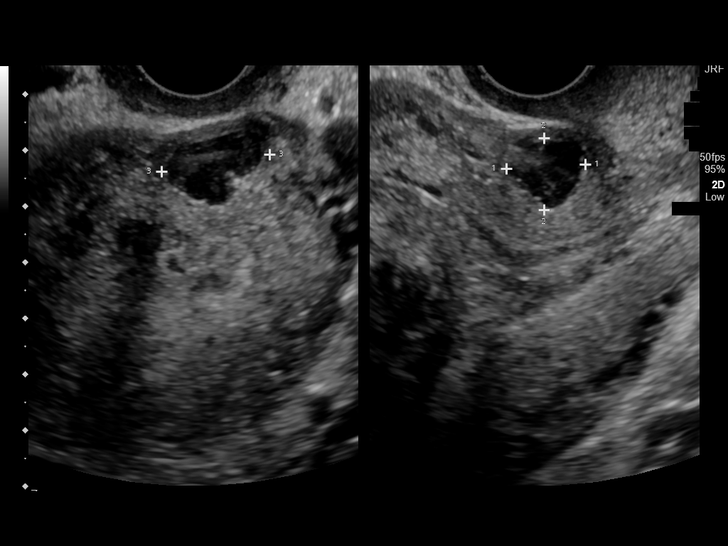
[im 71/95]
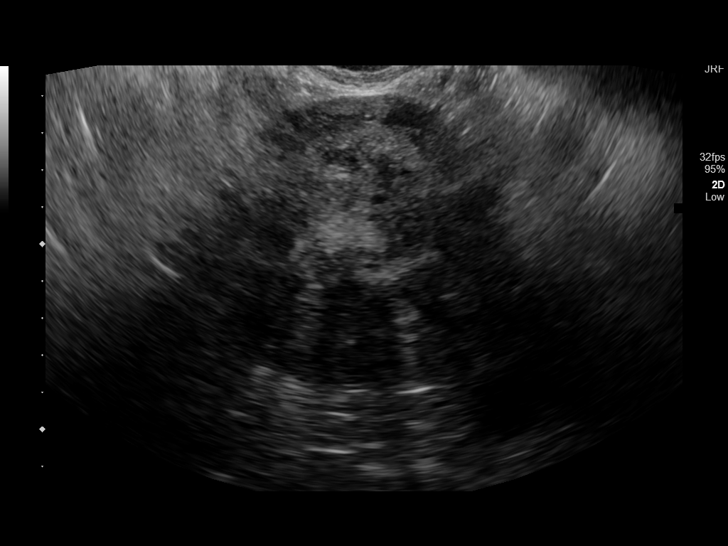
[im 79/95]
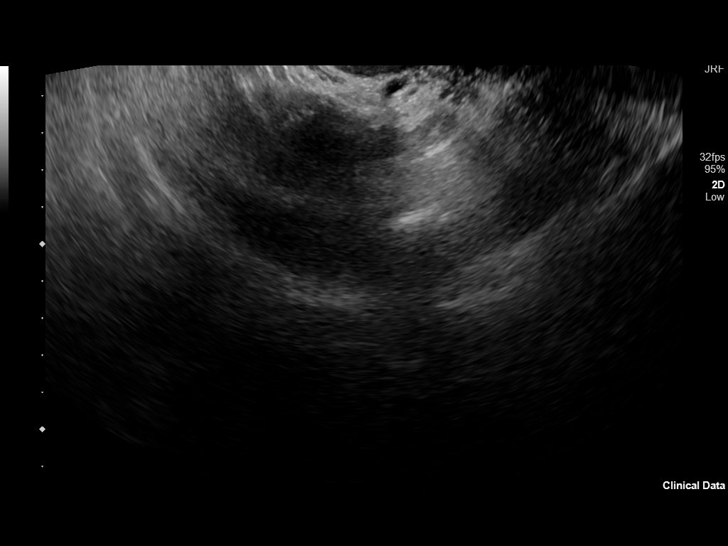
[im 87/95]
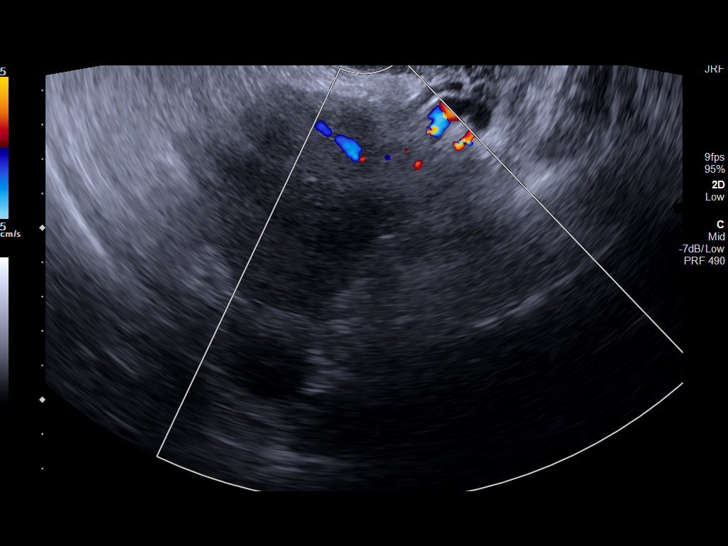
[im 95/95]
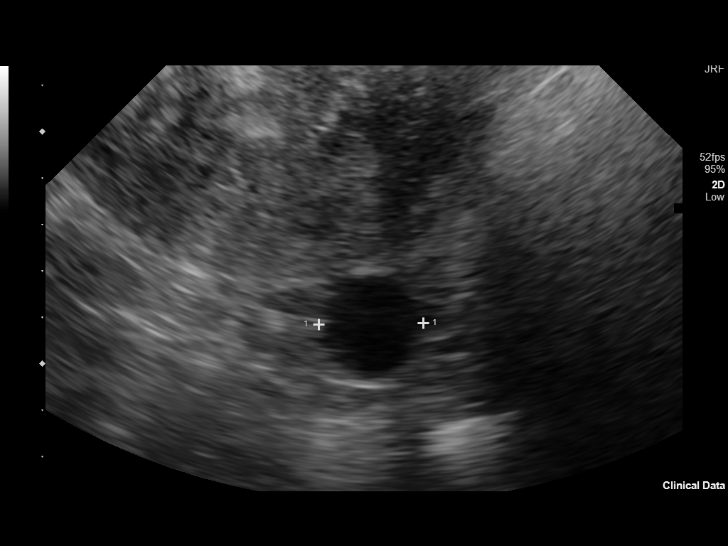

[13 of 25 positions shown; findings below may reference images not displayed]

FINDINGS: Uterus

Measurements: 14.0 by 8.3 by 8.7 cm. = volume: 524 mL. Anteverted.
Within the lower uterine segment there is a left anterior myometrial
fibroid measuring 1.4 x 1 0.3/2.0 cm.

Endometrium

Thickness: 47.3 mm. There is a heterogeneous, predominantly solid
mass within the endometrium which measures 5.3 x 3.7 x 4.1 cm. There
is increased blood flow within this mass. There are also several
small internal cystic areas.

Right ovary

Not visualized.

Left ovary

Measurements: 2.9 x 3.4 x 2.3 cm = volume: 12 mL. Normal
appearance/no adnexal mass.

Other findings

Small volume of free fluid noted.
IMPRESSION: 1. There is a mass identified within the endometrium with a maximum
dimension of 5.3 cm. Primary differential considerations include sub
mucosal fibroid, endometrial polyp, or carcinoma. Consider
sonohysterogram for further evaluation, prior to hysteroscopy or
endometrial biopsy.

## 2020-07-10 ENCOUNTER — Other Ambulatory Visit: Payer: Self-pay | Admitting: Obstetrics and Gynecology

## 2020-07-10 ENCOUNTER — Encounter: Payer: Self-pay | Admitting: Obstetrics and Gynecology

## 2020-07-10 ENCOUNTER — Other Ambulatory Visit: Payer: Self-pay

## 2020-07-10 ENCOUNTER — Ambulatory Visit: Payer: 59 | Admitting: Obstetrics and Gynecology

## 2020-07-10 VITALS — BP 127/73 | HR 75 | Ht 68.0 in | Wt 317.4 lb

## 2020-07-10 DIAGNOSIS — Z319 Encounter for procreative management, unspecified: Secondary | ICD-10-CM

## 2020-07-10 DIAGNOSIS — D6851 Activated protein C resistance: Secondary | ICD-10-CM

## 2020-07-10 DIAGNOSIS — Z6841 Body Mass Index (BMI) 40.0 and over, adult: Secondary | ICD-10-CM

## 2020-07-10 DIAGNOSIS — E282 Polycystic ovarian syndrome: Secondary | ICD-10-CM | POA: Diagnosis not present

## 2020-07-10 MED ORDER — LETROZOLE 2.5 MG PO TABS: 5.0000 mg | ORAL_TABLET | Freq: Every day | ORAL | 3 refills | Status: DC

## 2020-07-10 NOTE — Progress Notes (Signed)
Pt present for f/u for fertility issues. Pt stated that she was doing well but noticed that her cycles are irregular since starting Femara.

## 2020-07-10 NOTE — Patient Instructions (Signed)
Semen Analysis Test Why am I having this test? A semen analysis test is done to check certain aspects of the health of a man's reproductive organs (testes) and the hormone system that plays a role in semen production. Semen is a whitish secretion that is released from the penis during the final phase of orgasm (ejaculation). It is made up of liquids and nutrients from the prostate gland, seminal vesicles, and other glands. It also contains sperm cells from the testes. A single sperm cell contains one complete set of a man's genetic coding (chromosomes). You may have this test as a part of infertility testing, which is done to help find out reasons for the inability to have a child. The test may also be done to determine whether a previously performed vasectomy was successful. A vasectomy is a procedure done to make a man permanently infertile by tying the tube (the vas deferens) that collects the sperm from the testicle. A vasectomy blocks the sperm from going through the vas deferens and penis so that the sperm will not go into the vagina during sexual intercourse. What is being tested? If the test is done as part of infertility testing, the shape (morphology), size, and movement (motility) of sperm cells will be included in the analysis. This testing may also include assessing a sperm cell's ability to penetrate an egg (fertilize) as well as the formation of genetic material (DNA). If the test is done to check the success of a vasectomy, the sample will be checked for the presence of sperm. What kind of sample is taken? A semen sample is required for this test. A semen sample will be collected by ejaculation into a sterile glass or plastic container provided by the lab. This can be done at home, in your health care provider's office, or in the lab. How do I collect samples at home?  If the sample will be collected at home, follow your health care provider's instructions about how to collect the  sample. The sample should be delivered to the lab within 1 hour after collection. It should also be protected from extreme heat or cold. How do I prepare for this test? For infertility testing: Avoid sexual activity for 2-3 days before the semen sample collection. However, do not avoid ejaculation for a prolonged period because this can alter the motility of sperm cells. For vasectomy success testing: Make sure that you ejaculate one or two times before the day of semen sample collection. This will clear the vas deferens of any sperm that were present before the vasectomy was performed. Tell a health care provider about:  All medicines you are taking, including vitamins, herbs, eye drops, creams, and over-the-counter medicines. How are the results reported? Your test results will be reported as values. Your health care provider will compare your results to normal ranges that were established after testing a large group of people (reference values). Reference values may vary among labs and hospitals. For this test, common reference values are:  Volume: 2-5 mL.  Liquefaction time: 20-30 minutes after collection.  Appearance: normal (whitish in color).  Motile/mL: greater than or equal to 10 million.  Sperm/mL: greater than or equal to 20 million.  Viscosity: greater than or equal to 3.  Agglutination: greater than or equal to 3.  Supravital: greater than or equal to 75% live.  Fructose: positive.  pH: 7.12-8.  Sperm count (density): greater than or equal to 20 million/mL.  Sperm motility: greater than or equal to 50%  at 1 hour.  Sperm morphology: greater than 30% Tyrell Antonio criteria greater than 14%) normally shaped. What do the results mean? Low sperm count, abnormal motility, or abnormal morphology of sperm cells may all cause problems with female fertility. Abnormal results can also be caused by:  Certain infectious diseases, such as inflammation of a testis (orchitis) from  mumps.  Receiving certain kinds of toxic drugs, such as chemotherapy.  Having testicles that did not develop normally in childhood. When the semen analysis test is done to check the success of a vasectomy, the presence of sperm may mean that the surgery was not successful. Your health care provider may suggest a repeat of this test. Talk with your health care provider about what your results mean. Questions to ask your health care provider Ask your health care provider, or the department that is doing the test:  When will my results be ready?  How will I get my results?  What are my treatment options?  What other tests do I need?  What are my next steps? Summary  A semen analysis test is done to check certain aspects of the health of a man's reproductive organs (testes) and the hormone system that plays a role in semen production.  You may have this test as a part of infertility testing, which is done to help find out reasons for the inability to have a child. The test may also be done to determine whether a previously performed vasectomy was successful.  A semen sample will be collected by ejaculation into a sterile glass or plastic container. Follow instructions about how to collect the sample, and make sure you deliver the sample to the lab within 1 hour of obtaining it.  Talk with your health care provider about what your results mean. This information is not intended to replace advice given to you by your health care provider. Make sure you discuss any questions you have with your health care provider. Document Revised: 08/05/2017 Document Reviewed: 08/05/2017 Elsevier Patient Education  [place].

## 2020-07-11 ENCOUNTER — Encounter: Payer: Self-pay | Admitting: Obstetrics and Gynecology

## 2020-07-11 NOTE — Progress Notes (Signed)
    GYNECOLOGY PROGRESS NOTE  Subjective:    Patient ID: Tara Shea, female    DOB: 02-Feb-1981, 39 y.o.   MRN: [id number]  HPI  Patient is a 39 y.o. G0P0000 female who presents for follow-up of infertility.  She has a history of PCOS, heavy menses, fibroid uterus (status post myomectomy x2), obesity, and factor V Leiden mutation.  She has been utilizing Femara for the past 3 months.  She reports that her menstrual cycles are a little more irregular since its use.  She reports fertility tracking and also is utilizing ovulation kits.  Notes the first 2 times the ovulation kits predicted her ovulation however the third kit that she use she noted that she was having strange results.  The following portions of the patient's history were reviewed and updated as appropriate: allergies, current medications, past family history, past medical history, past social history, past surgical history and problem list.  Review of Systems Pertinent items noted in HPI and remainder of comprehensive ROS otherwise negative.   Objective:   Blood pressure 127/73, pulse 75, height $RemoveBe'5\' 8"'[id number]$  (1.727 m), weight (!) 317 lb 6.4 oz (144 kg), last menstrual period 07/04/2020. General appearance: alert and no distress Remainder of exam deferred.   Assessment:   1. Infertility management   2. PCOS (polycystic ovarian syndrome)   3. Factor V Leiden mutation ([place])   4. Morbid obesity with BMI of 45.0-49.9, adult ([place])     Plan:   1.  Discussion had with patient regarding future fertility management.  Can continue on ovulation induction medications with Femara at higher dose, previously was taking 2.5 mg will increase to 5 mg.  Patient also inquires as to whether or not her husband should be tested.  Can recommend performance of semen analysis at this time.  Given patient instructions for completion.   2.  Discussed that some irregularities in the cycle may occur with the use of Femara however this is okay. 3.  Can  offer patient ultrasound in luteal phase 2 ensure that ovulation has occurred.  Would recommend performing around day 18-20 of her cycle.  Will schedule.   A total of 15 minutes were spent face-to-face with the patient during this encounter and over half of that time dealt with counseling and coordination of care.    Rubie Maid, MD [facility]

## 2020-07-24 ENCOUNTER — Other Ambulatory Visit: Payer: 59

## 2020-08-14 ENCOUNTER — Other Ambulatory Visit: Payer: Self-pay

## 2020-08-14 ENCOUNTER — Ambulatory Visit (INDEPENDENT_AMBULATORY_CARE_PROVIDER_SITE_OTHER): Payer: 59

## 2020-08-14 DIAGNOSIS — Z319 Encounter for procreative management, unspecified: Secondary | ICD-10-CM | POA: Diagnosis not present

## 2020-08-14 DIAGNOSIS — E282 Polycystic ovarian syndrome: Secondary | ICD-10-CM

## 2020-08-17 ENCOUNTER — Telehealth: Payer: 59 | Admitting: Orthopedic Surgery

## 2020-08-17 DIAGNOSIS — R399 Unspecified symptoms and signs involving the genitourinary system: Secondary | ICD-10-CM | POA: Diagnosis not present

## 2020-08-17 MED ORDER — NITROFURANTOIN MONOHYD MACRO 100 MG PO CAPS: 100.0000 mg | ORAL_CAPSULE | Freq: Two times a day (BID) | ORAL | 0 refills | Status: AC

## 2020-08-17 NOTE — Progress Notes (Signed)

## 2020-10-21 ENCOUNTER — Other Ambulatory Visit: Payer: Self-pay | Admitting: Internal Medicine

## 2020-10-21 NOTE — Telephone Encounter (Signed)
Refill request submitted by patient's pharmacy. Per Dr. Rogue Bussing, patient is not following him in the cancer center any more. He would prefer that Dr. Marcelline Mates handle the RF of the Lovenox. I contacted the patient. She does not have an [facility] hematology apt until June 2022 due to change in insurance. She states that [facility] is not able to accommodate her until June next year.  She states that she will contact Dr. Marcelline Mates for her RF.

## 2020-10-25 IMAGING — US US EXTREM LOW VENOUS*L*
1 series · 14 of 24 positions shown · non-contrast
Comparison: 04/07/2019

CLINICAL DATA: Left leg pain and swelling, factor 5 clotting
disorder, history of DVT

EXAM:
LEFT LOWER EXTREMITY VENOUS DOPPLER ULTRASOUND
TECHNIQUE: Gray-scale sonography with compression, as well as color and duplex
ultrasound, were performed to evaluate the deep venous system from
the level of the common femoral vein through the popliteal and
proximal calf veins.

[Series 1: us extrem low venous*left* · 0.08mm/px · 14 of 35 slices shown]
[im 1/35]
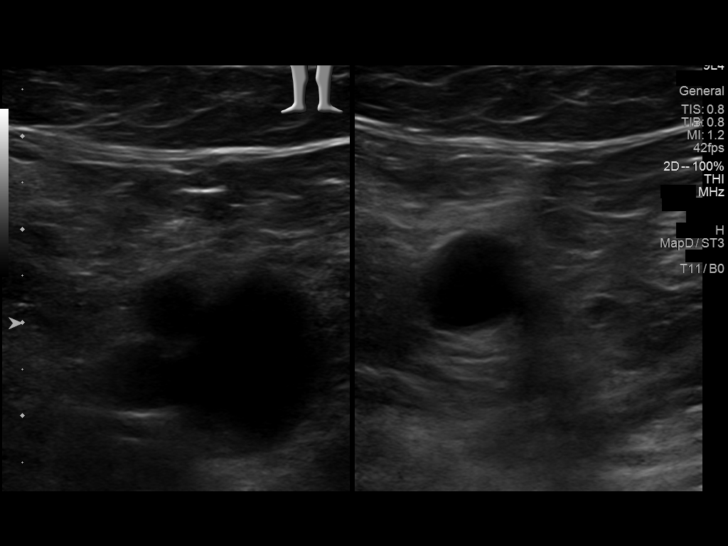
[im 3/35]
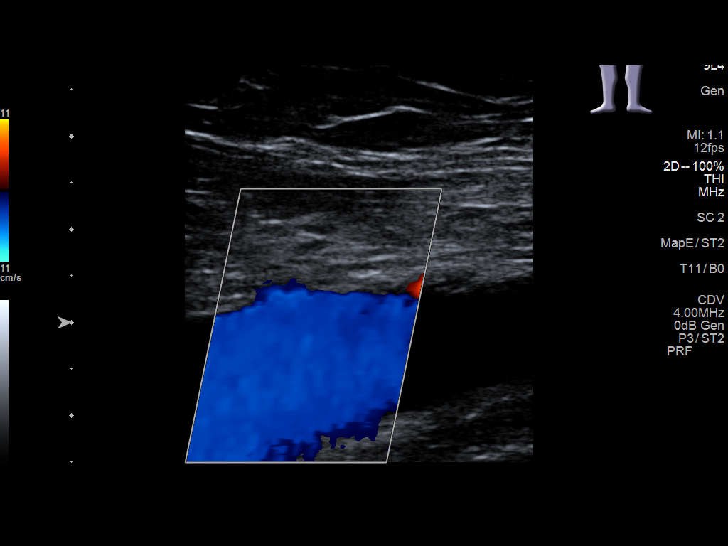
[im 6/35]
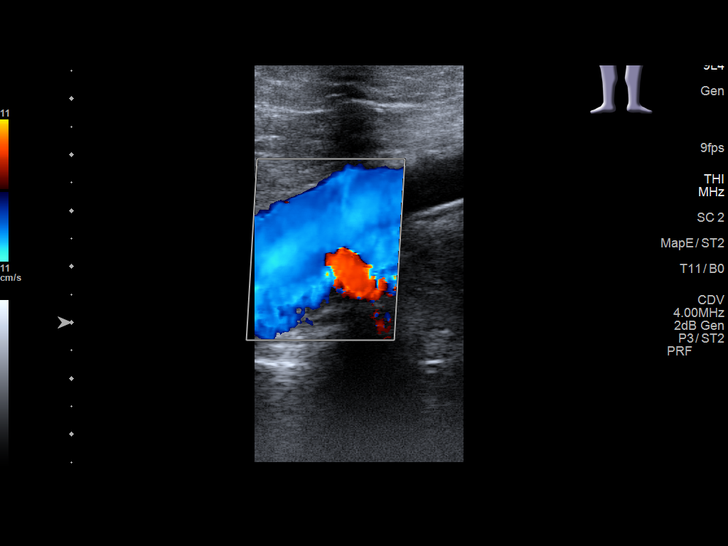
[im 9/35]
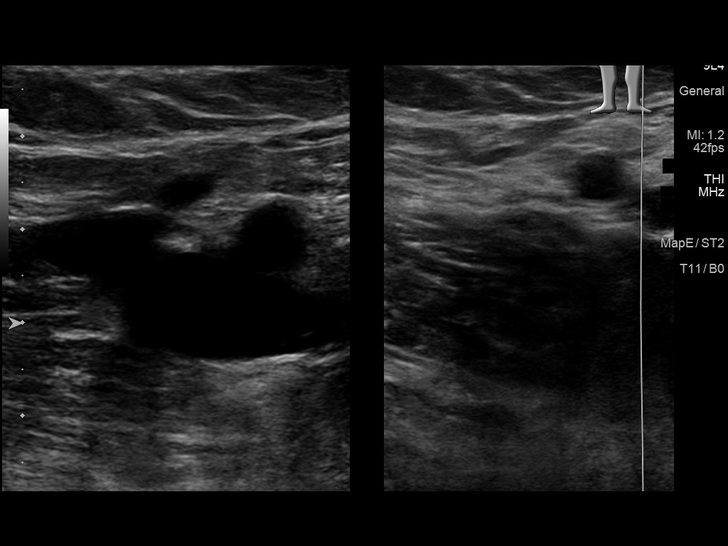
[im 11/35]
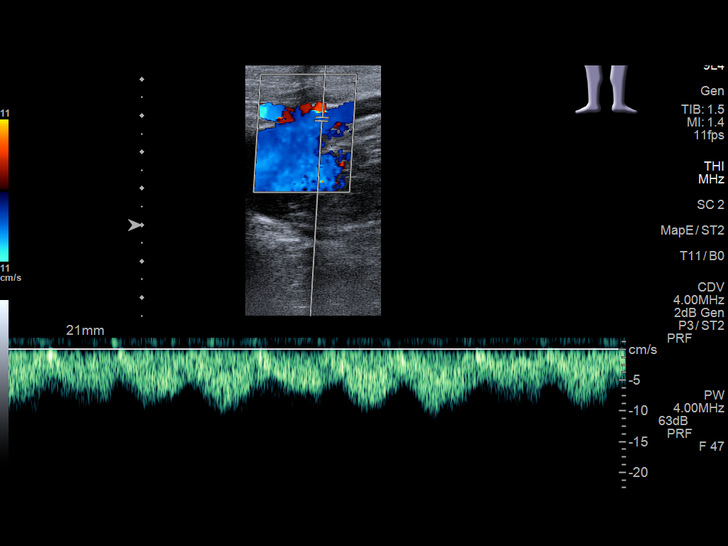
[im 14/35]
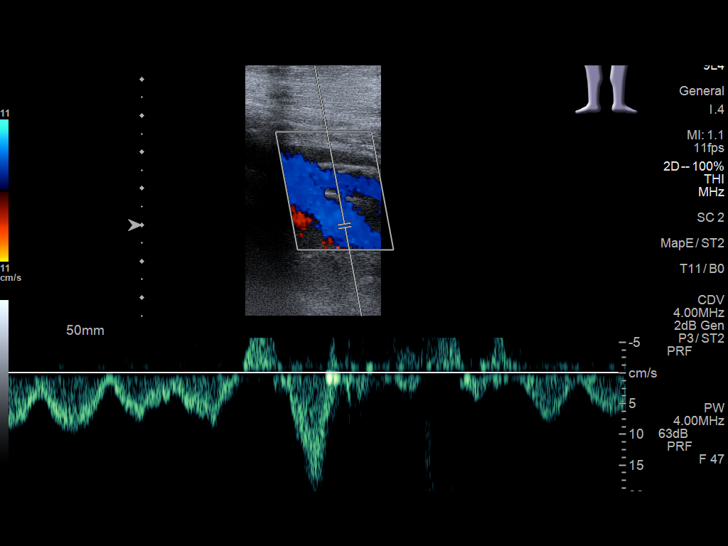
[im 17/35]
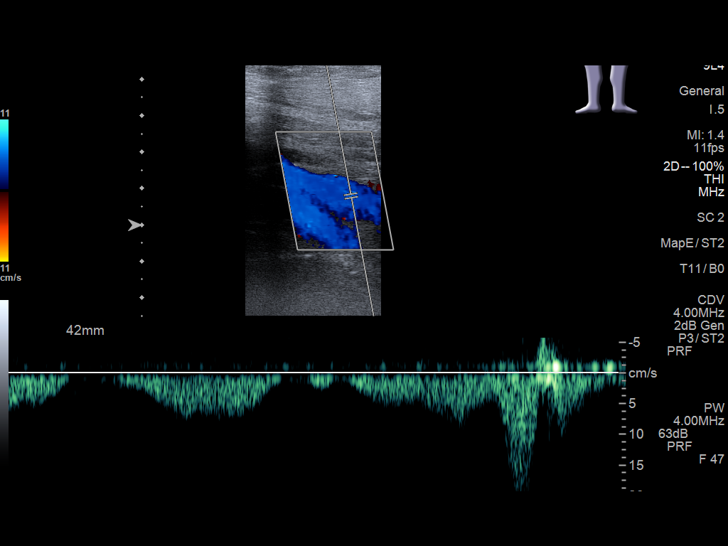
[im 18/35]
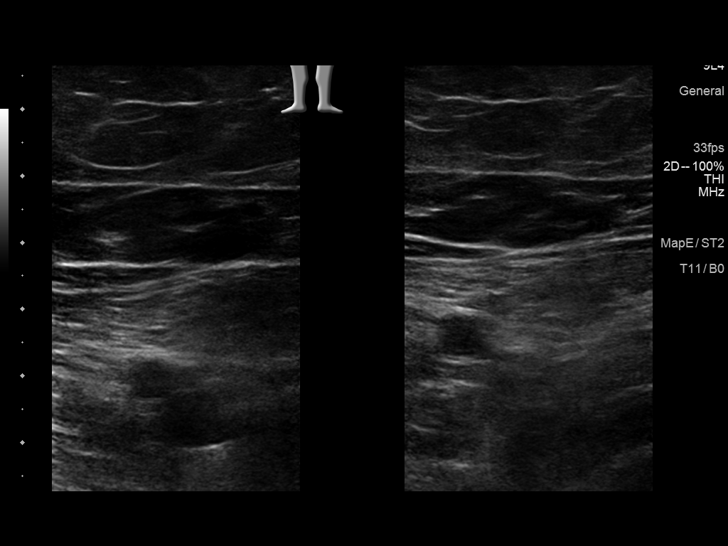
[im 21/35]
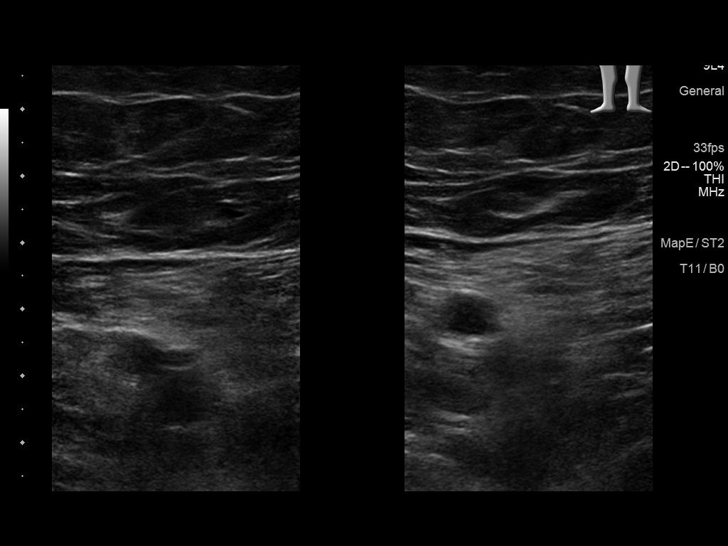
[im 24/35]
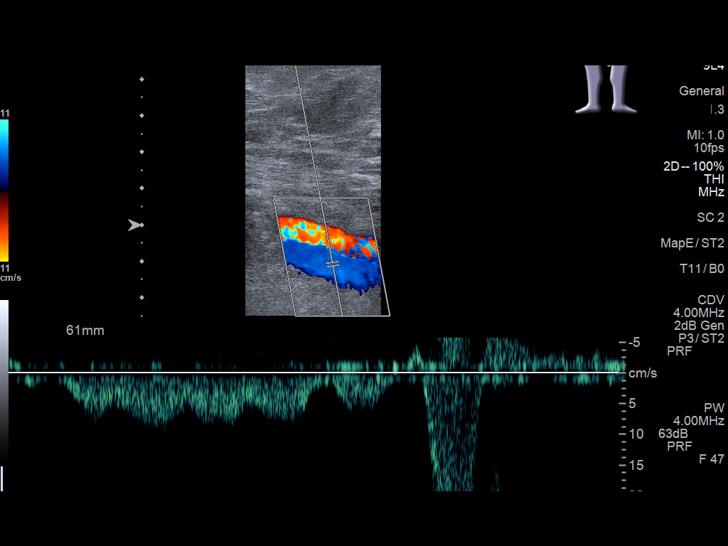
[im 27/35]
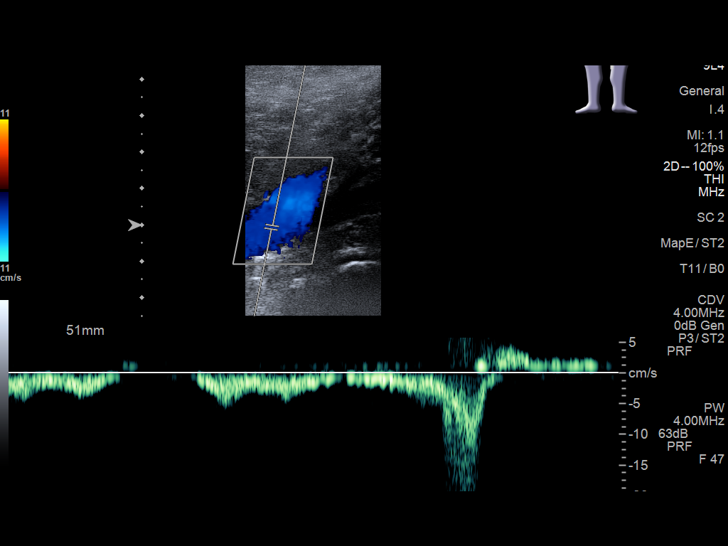
[im 29/35]
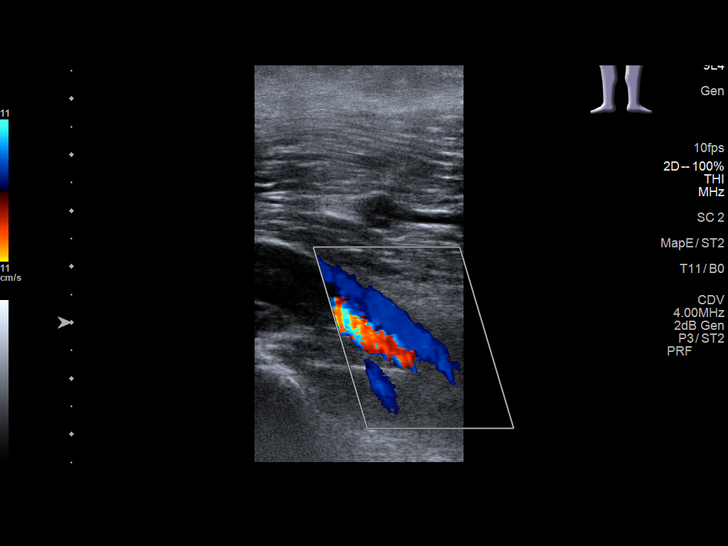
[im 32/35]
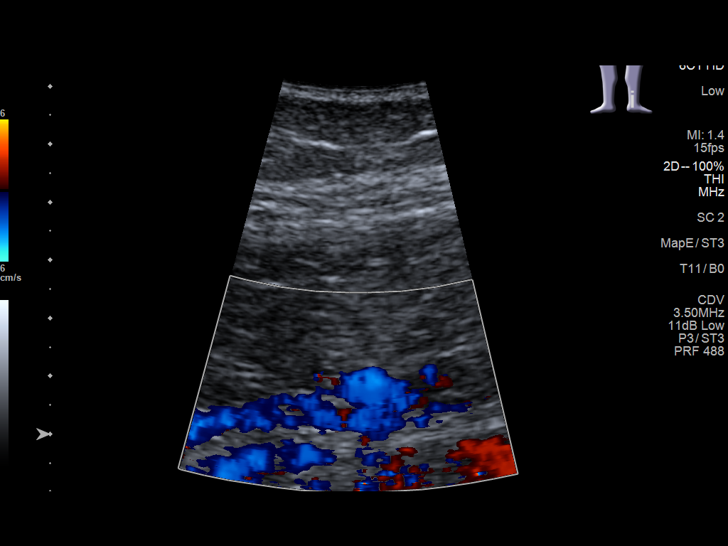
[im 35/35]
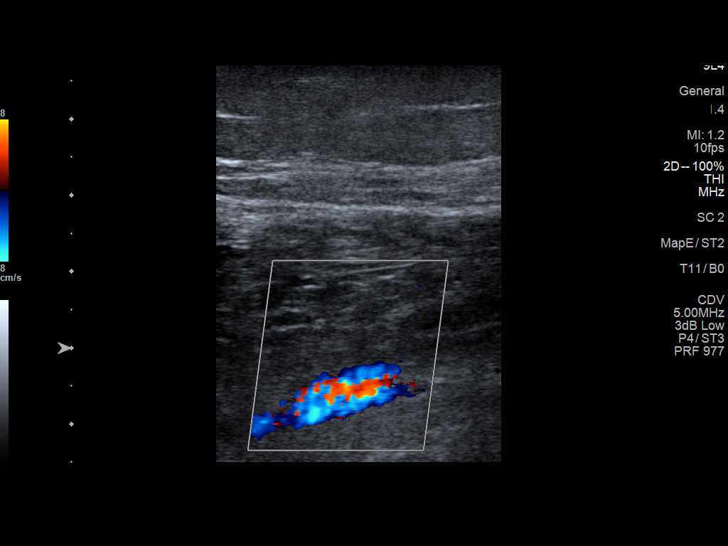

[14 of 24 positions shown; findings below may reference images not displayed]

FINDINGS: Normal compressibility of the common femoral, superficial femoral,
and popliteal veins, as well as the proximal calf veins. No filling
defects to suggest DVT on grayscale or color Doppler imaging.
Doppler waveforms show normal direction of venous flow, normal
respiratory phasicity and response to augmentation. Survey views of
the contralateral common femoral vein are unremarkable.
IMPRESSION: No femoropopliteal and no calf DVT in the visualized calf veins. If
clinical symptoms are inconsistent or if there are persistent or
worsening symptoms, further imaging (possibly involving the iliac
veins) may be warranted.

## 2020-10-29 ENCOUNTER — Other Ambulatory Visit: Payer: Self-pay | Admitting: Internal Medicine

## 2020-11-22 ENCOUNTER — Other Ambulatory Visit: Payer: Self-pay | Admitting: Obstetrics and Gynecology

## 2020-11-25 NOTE — Telephone Encounter (Signed)
Pt missed her follow up appt in November 2021. Send a Clinical cytogeneticist message for pt to contact the office to schedule an appt. Would you like to refill her medication? Thanks [employer]

## 2020-11-25 NOTE — Telephone Encounter (Signed)
Will you please contact the pt to schedule an appointment for a follow up for infertility and medication refill. Thanks [employer]

## 2020-11-25 NOTE — Telephone Encounter (Signed)
She needs an appointment first before we refill.

## 2020-11-26 ENCOUNTER — Other Ambulatory Visit: Payer: Self-pay

## 2020-11-26 ENCOUNTER — Other Ambulatory Visit: Payer: Self-pay | Admitting: Obstetrics and Gynecology

## 2020-11-26 ENCOUNTER — Encounter: Payer: Self-pay | Admitting: Obstetrics and Gynecology

## 2020-11-26 ENCOUNTER — Ambulatory Visit: Payer: 59 | Admitting: Obstetrics and Gynecology

## 2020-11-26 VITALS — BP 117/76 | HR 71 | Ht 68.0 in | Wt 323.9 lb

## 2020-11-26 DIAGNOSIS — E282 Polycystic ovarian syndrome: Secondary | ICD-10-CM | POA: Diagnosis not present

## 2020-11-26 DIAGNOSIS — Z6841 Body Mass Index (BMI) 40.0 and over, adult: Secondary | ICD-10-CM

## 2020-11-26 DIAGNOSIS — N920 Excessive and frequent menstruation with regular cycle: Secondary | ICD-10-CM

## 2020-11-26 DIAGNOSIS — D6851 Activated protein C resistance: Secondary | ICD-10-CM | POA: Diagnosis not present

## 2020-11-26 DIAGNOSIS — Z319 Encounter for procreative management, unspecified: Secondary | ICD-10-CM | POA: Diagnosis not present

## 2020-11-26 DIAGNOSIS — N979 Female infertility, unspecified: Secondary | ICD-10-CM | POA: Insufficient documentation

## 2020-11-26 DIAGNOSIS — Z86018 Personal history of other benign neoplasm: Secondary | ICD-10-CM

## 2020-11-26 MED ORDER — LETROZOLE 2.5 MG PO TABS
ORAL_TABLET | ORAL | 3 refills | Status: DC
Start: 2020-11-26 — End: 2021-01-23

## 2020-11-26 MED ORDER — METFORMIN HCL 500 MG PO TABS: ORAL_TABLET | ORAL | 5 refills | Status: DC

## 2020-11-26 NOTE — Progress Notes (Signed)
GYNECOLOGY PROGRESS NOTE  Subjective:    Patient ID: Tara Shea, female    DOB: 02/18/1981, 40 y.o.   MRN: [id number]  HPI  Patient is a 40 y.o. Tara Shea female who presents for 3 month f/u of infertility. She has a previous history of DUB, PCOS, and fibroid uterus s/p myomectomy x 2. Also with obesity and factor V Leiden mutation. She was previously taking Letrozole 5 mg dosing, but has completed 3 month treatment. Denies any side effects. Notes that she still has nausea with her cycles, but this was ongoing before use of the medication. She does however note that her cycles are occurring regularly and are less heavy on the Letrozole.  Continues to take her Lovenox. Is timing intercourse and tracking cycle on an app. Inquires if she should also be taking Metformin. Notes that her husband has not completed the semen analysis and lost the kit.   The following portions of the patient's history were reviewed and updated as appropriate: allergies, current medications, past family history, past medical history, past social history, past surgical history and problem list.  Review of Systems Pertinent items noted in HPI and remainder of comprehensive ROS otherwise negative.   Objective:   Blood pressure 117/76, pulse 71, height 5' 8"  (1.727 m), weight (!) 323 lb 14.4 oz (146.9 kg), last menstrual period 11/19/2020. General appearance: alert and no distress Remainder of exam deferred.     Imaging:  US PELVIS TRANSVAGINAL NON-OB (TV ONLY) Patient Name: Tara Shea DOB: 04-26-1981 MRN: [id number] ULTRASOUND REPORT  Location: [facility]  Date of Service: 08/14/2020    TECHNIQUE: Both transabdominal and transvaginal ultrasound examinations of the pelvis were performed. Transabdominal technique was performed for global imaging of the pelvis including uterus, ovaries, adnexal regions, and pelvic cul-de-sac. It was necessary to proceed with endovaginal exam following  the transabdominal exam to visualize the endometrium and adnexa.  Indications:Enlarged Uterus   Findings:  The uterus is anteverted and measures 12.6x x6.7 x 8.3 cm. Echo texture is heterogenous with evidence of focal masses. Within the uterus are multiple suspected fibroids measuring: Fibroid 1:Anterior IM 1.0 x 1.2 x 1.9 cm Fibroid 2:Posterior IM 1.7 x x1.2 x 1.2 cm.  Uterus is very fibrous making it difficult to distinguish other fibroids.   The Endometrium measures 12 mm.  Right Ovary measures 3.7 x 2.6 x 3.7 cm. It is normal in appearance.  Hypoechoic lesion with smooth walls and good through transmission  measuring 2.6 x 1.9 x 3.0 cm. Left Ovary measures 3.1 x 1.8 x 2.4 cm. It is normal in appearance.  Hypoechoic lesion with smooth walls and good through transmission    measuring 2.0 x 6.7 x 8.3 cm. Survey of the adnexa demonstrates no adnexal masses. There is no free fluid in the cul de sac.  Impression: 1. Bilateral ovarian simple cysts as described above. 2. Enlarge heterogenous uterus as described above.  Recommendations: 1.Clinical correlation with the patient's History and Physical Exam.  Jenine M. Albertine Grates    RDMS  I have reviewed this study and agree with documented findings.   Rubie Maid, MD [facility] US PELVIS (TRANSABDOMINAL ONLY) Patient Name: Tara Shea DOB: 1981-04-12 MRN: [id number] ULTRASOUND REPORT  Location: [facility] Date of Service: 08/14/2020    TECHNIQUE: Both transabdominal and transvaginal ultrasound examinations of the pelvis were performed. Transabdominal technique was performed for global imaging of the pelvis including uterus, ovaries, adnexal regions, and pelvic cul-de-sac. It was necessary  to proceed with endovaginal exam following the transabdominal exam to visualize the endometrium and adnexa.  Indications:Enlarged Uterus   Findings:  The uterus is anteverted and measures 12.6x x6.7 x 8.3  cm. Echo texture is heterogenous with evidence of focal masses. Within the uterus are multiple suspected fibroids measuring: Fibroid 1:Anterior IM 1.0 x 1.2 x 1.9 cm Fibroid 2:Posterior IM 1.7 x x1.2 x 1.2 cm.  Uterus is very fibrous making it difficult to distinguish other fibroids.   The Endometrium measures 12 mm.  Right Ovary measures 3.7 x 2.6 x 3.7 cm. It is normal in appearance.  Hypoechoic lesion with smooth walls and good through transmission  measuring 2.6 x 1.9 x 3.0 cm. Left Ovary measures 3.1 x 1.8 x 2.4 cm. It is normal in appearance.  Hypoechoic lesion with smooth walls and good through transmission  measuring 2.0 x 6.7 x 8.3 cm. Survey of the adnexa demonstrates no adnexal masses. There is no free fluid in the cul de sac.  Impression: 1. Bilateral ovarian simple cysts as described above. 2. Enlarge heterogenous uterus as described above.  Recommendations: 1.Clinical correlation with the patient's History and Physical Exam.  Jenine M. Albertine Grates    RDMS  I have reviewed this study and agree with documented findings.   Rubie Maid, MD [facility]    Results for orders placed or performed in visit on 03/05/20  Anti mullerian hormone  Result Value Ref Range   ANTI-MULLERIAN HORMONE (AMH) 0.450 ng/mL  FSH/LH  Result Value Ref Range   LH 5.8 mIU/mL   FSH 5.7 mIU/mL  CBC  Result Value Ref Range   WBC 4.6 3.4 - 10.8 x10E3/uL   RBC 3.45 (L) 3.77 - 5.28 x10E6/uL   Hemoglobin 9.5 (L) 11.1 - 15.9 g/dL   Hematocrit 30.4 (L) 34.0 - 46.6 %   MCV 88 79 - 97 fL   MCH 27.5 26.6 - 33.0 pg   MCHC 31.3 (L) 31.5 - 35.7 g/dL   RDW 13.4 11.7 - 15.4 %   Platelets 342 150 - 450 x10E3/uL  Estradiol  Result Value Ref Range   Estradiol 105.0 pg/mL  Progesterone  Result Value Ref Range   Progesterone 0.1 ng/mL     Assessment:   1. Infertility management   2. PCOS (polycystic ovarian syndrome)   3. Factor V Leiden mutation ([place])   4. Morbid obesity with  BMI of 45.0-49.9, adult ([place])   5. History of uterine fibroid   6. Menorrhagia with regular cycle     Plan:   1.  Discussion had with patient regarding future fertility management.  Can continue on ovulation induction medications with Femara at higher dose, previously was taking 5 mg will increase to 7.5 mg.  Strongly encouraged her husband to be tested with semen analysis at this time.  Given another kit and patient instructions for completion.   2.  Menorrhagia with cycles has improved on Femara.  3.  Ultrasound inconclusive as patient with cysts, has a h/o PCOS as well as being on Femara which can lead to increase in follicles. Continue use of ovulation kits and period tracking. Discussed use of Metformin at patient's request, advised that it wasn't necessary based on normal HgbA1c, however has been shown to be helpful in infertility management.  Will order, to begin at 500 mg BID.  4. Advised that if no pregnancy occurs after current round of ovulation medication (over the next 3 months), would recommend referral to [facility] specialist.  4. Continue  Lovenox for Factor V Leiden mutation.  5. RTC in 3 months.    A total of 15 minutes were spent face-to-face with the patient during this encounter and over half of that time dealt with counseling and coordination of care.   Rubie Maid, MD [facility]

## 2020-11-26 NOTE — Progress Notes (Signed)
Pt present for follow up for inferitiliy issues. Pt stated that she was doing well and requested a refill of the medication Femara 2.5 mg.

## 2020-12-19 ENCOUNTER — Telehealth: Payer: 59 | Admitting: Orthopedic Surgery

## 2020-12-19 ENCOUNTER — Other Ambulatory Visit: Payer: Self-pay | Admitting: Orthopedic Surgery

## 2020-12-19 DIAGNOSIS — R0989 Other specified symptoms and signs involving the circulatory and respiratory systems: Secondary | ICD-10-CM | POA: Diagnosis not present

## 2020-12-19 MED ORDER — AMOXICILLIN-POT CLAVULANATE 875-125 MG PO TABS: 1.0000 | ORAL_TABLET | Freq: Two times a day (BID) | ORAL | 0 refills | Status: DC

## 2020-12-19 NOTE — Progress Notes (Signed)
We are sorry that you are not feeling well.  Here is how we plan to help!  THIS COULD STILL BE A COVID INFECTION. I ENCOURAGE YOU TO GET TESTED.  Based on what you have shared with me it looks like you have sinusitis.  Sinusitis is inflammation and infection in the sinus cavities of the head.  Based on your presentation I believe you most likely have Acute Bacterial Sinusitis.  This is an infection caused by bacteria and is treated with antibiotics. I have prescribed Augmentin 875mg /125mg  one tablet twice daily with food, for 7 days. You may use an oral decongestant such as Mucinex D or if you have glaucoma or high blood pressure use plain Mucinex. Saline nasal spray help and can safely be used as often as needed for congestion.  If you develop worsening sinus pain, fever or notice severe headache and vision changes, or if symptoms are not better after completion of antibiotic, please schedule an appointment with a health care provider.    Sinus infections are not as easily transmitted as other respiratory infection, however we still recommend that you avoid close contact with loved ones, especially the very young and elderly.  Remember to wash your hands thoroughly throughout the day as this is the number one way to prevent the spread of infection!  Home Care:  Only take medications as instructed by your medical team.  Complete the entire course of an antibiotic.  Do not take these medications with alcohol.  A steam or ultrasonic humidifier can help congestion.  You can place a towel over your head and breathe in the steam from hot water coming from a faucet.  Avoid close contacts especially the very young and the elderly.  Cover your mouth when you cough or sneeze.  Always remember to wash your hands.  Get Help Right Away If:  You develop worsening fever or sinus pain.  You develop a severe head ache or visual changes.  Your symptoms persist after you have completed your treatment  plan.  Make sure you  Understand these instructions.  Will watch your condition.  Will get help right away if you are not doing well or get worse.  Your e-visit answers were reviewed by a board certified advanced clinical practitioner to complete your personal care plan.  Depending on the condition, your plan could have included both over the counter or prescription medications.  If there is a problem please reply  once you have received a response from your provider.  Your safety is important to [place].  If you have drug allergies check your prescription carefully.    You can use MyChart to ask questions about today's visit, request a non-urgent call back, or ask for a work or school excuse for 24 hours related to this e-Visit. If it has been greater than 24 hours you will need to follow up with your provider, or enter a new e-Visit to address those concerns.  You will get an e-mail in the next two days asking about your experience.  I hope that your e-visit has been valuable and will speed your recovery. Thank you for using e-visits.   Greater than 5 minutes, yet less than 10 minutes of time have been spent researching, coordinating and implementing care for this patient today.

## 2021-01-20 ENCOUNTER — Inpatient Hospital Stay
Admission: EM | Admit: 2021-01-20 | Discharge: 2021-01-23 | DRG: 164 | Disposition: A | Discharge status: Home / Self Care | Payer: 59 | Source: Ambulatory Visit | Attending: Student | Admitting: Student

## 2021-01-20 ENCOUNTER — Telehealth (INDEPENDENT_AMBULATORY_CARE_PROVIDER_SITE_OTHER): Payer: 59 | Admitting: Family Medicine

## 2021-01-20 ENCOUNTER — Observation Stay: Payer: 59

## 2021-01-20 ENCOUNTER — Encounter: Payer: Self-pay | Admitting: Family Medicine

## 2021-01-20 ENCOUNTER — Observation Stay (HOSPITAL_BASED_OUTPATIENT_CLINIC_OR_DEPARTMENT_OTHER)
Admit: 2021-01-20 | Discharge: 2021-01-20 | Disposition: A | Discharge status: Home / Self Care | Payer: 59 | Attending: Internal Medicine | Admitting: Internal Medicine

## 2021-01-20 ENCOUNTER — Emergency Department: Payer: 59

## 2021-01-20 ENCOUNTER — Other Ambulatory Visit: Payer: Self-pay

## 2021-01-20 ENCOUNTER — Encounter: Payer: Self-pay | Admitting: Emergency Medicine

## 2021-01-20 DIAGNOSIS — I2609 Other pulmonary embolism with acute cor pulmonale: Secondary | ICD-10-CM

## 2021-01-20 DIAGNOSIS — I82409 Acute embolism and thrombosis of unspecified deep veins of unspecified lower extremity: Secondary | ICD-10-CM | POA: Diagnosis present

## 2021-01-20 DIAGNOSIS — D500 Iron deficiency anemia secondary to blood loss (chronic): Secondary | ICD-10-CM | POA: Diagnosis not present

## 2021-01-20 DIAGNOSIS — Z20822 Contact with and (suspected) exposure to covid-19: Secondary | ICD-10-CM | POA: Diagnosis present

## 2021-01-20 DIAGNOSIS — Z79811 Long term (current) use of aromatase inhibitors: Secondary | ICD-10-CM

## 2021-01-20 DIAGNOSIS — D6851 Activated protein C resistance: Secondary | ICD-10-CM | POA: Diagnosis not present

## 2021-01-20 DIAGNOSIS — I2699 Other pulmonary embolism without acute cor pulmonale: Principal | ICD-10-CM | POA: Diagnosis present

## 2021-01-20 DIAGNOSIS — Z79899 Other long term (current) drug therapy: Secondary | ICD-10-CM

## 2021-01-20 DIAGNOSIS — J4520 Mild intermittent asthma, uncomplicated: Secondary | ICD-10-CM | POA: Diagnosis present

## 2021-01-20 DIAGNOSIS — Z833 Family history of diabetes mellitus: Secondary | ICD-10-CM

## 2021-01-20 DIAGNOSIS — R0602 Shortness of breath: Secondary | ICD-10-CM | POA: Insufficient documentation

## 2021-01-20 DIAGNOSIS — E669 Obesity, unspecified: Secondary | ICD-10-CM | POA: Diagnosis present

## 2021-01-20 DIAGNOSIS — Z86711 Personal history of pulmonary embolism: Secondary | ICD-10-CM

## 2021-01-20 DIAGNOSIS — I82452 Acute embolism and thrombosis of left peroneal vein: Secondary | ICD-10-CM | POA: Diagnosis present

## 2021-01-20 DIAGNOSIS — T45516A Underdosing of anticoagulants, initial encounter: Secondary | ICD-10-CM | POA: Diagnosis present

## 2021-01-20 DIAGNOSIS — Z87442 Personal history of urinary calculi: Secondary | ICD-10-CM

## 2021-01-20 DIAGNOSIS — I82442 Acute embolism and thrombosis of left tibial vein: Secondary | ICD-10-CM | POA: Diagnosis present

## 2021-01-20 DIAGNOSIS — Z8349 Family history of other endocrine, nutritional and metabolic diseases: Secondary | ICD-10-CM

## 2021-01-20 DIAGNOSIS — E538 Deficiency of other specified B group vitamins: Secondary | ICD-10-CM | POA: Diagnosis present

## 2021-01-20 DIAGNOSIS — I82492 Acute embolism and thrombosis of other specified deep vein of left lower extremity: Secondary | ICD-10-CM

## 2021-01-20 DIAGNOSIS — I82432 Acute embolism and thrombosis of left popliteal vein: Secondary | ICD-10-CM | POA: Diagnosis present

## 2021-01-20 DIAGNOSIS — Z86718 Personal history of other venous thrombosis and embolism: Secondary | ICD-10-CM

## 2021-01-20 DIAGNOSIS — I2602 Saddle embolus of pulmonary artery with acute cor pulmonale: Secondary | ICD-10-CM

## 2021-01-20 DIAGNOSIS — I248 Other forms of acute ischemic heart disease: Secondary | ICD-10-CM | POA: Diagnosis present

## 2021-01-20 DIAGNOSIS — R0902 Hypoxemia: Secondary | ICD-10-CM | POA: Diagnosis present

## 2021-01-20 DIAGNOSIS — E282 Polycystic ovarian syndrome: Secondary | ICD-10-CM | POA: Diagnosis present

## 2021-01-20 DIAGNOSIS — Z6841 Body Mass Index (BMI) 40.0 and over, adult: Secondary | ICD-10-CM

## 2021-01-20 DIAGNOSIS — Z8249 Family history of ischemic heart disease and other diseases of the circulatory system: Secondary | ICD-10-CM

## 2021-01-20 DIAGNOSIS — Z803 Family history of malignant neoplasm of breast: Secondary | ICD-10-CM

## 2021-01-20 DIAGNOSIS — Z9049 Acquired absence of other specified parts of digestive tract: Secondary | ICD-10-CM

## 2021-01-20 DIAGNOSIS — Z825 Family history of asthma and other chronic lower respiratory diseases: Secondary | ICD-10-CM

## 2021-01-20 DIAGNOSIS — Z91138 Patient's unintentional underdosing of medication regimen for other reason: Secondary | ICD-10-CM

## 2021-01-20 LAB — BRAIN NATRIURETIC PEPTIDE: B Natriuretic Peptide: 16.4 pg/mL (ref 0.0–100.0)

## 2021-01-20 LAB — BASIC METABOLIC PANEL
Anion gap: 9 (ref 5–15)
BUN: 16 mg/dL (ref 6–20)
CO2: 21 mmol/L — ABNORMAL LOW (ref 22–32)
Calcium: 9 mg/dL (ref 8.9–10.3)
Chloride: 106 mmol/L (ref 98–111)
Creatinine, Ser: 0.75 mg/dL (ref 0.44–1.00)
GFR, Estimated: >60 mL/min (ref >60)
Glucose, Bld: 112 mg/dL — ABNORMAL HIGH (ref 70–99)
Potassium: 3.5 mmol/L (ref 3.5–5.1)
Sodium: 136 mmol/L (ref 135–145)

## 2021-01-20 LAB — URINALYSIS, COMPLETE (UACMP) WITH MICROSCOPIC
Bilirubin Urine: NEGATIVE
Glucose, UA: NEGATIVE mg/dL
Ketones, ur: 5 mg/dL — AB
Leukocytes,Ua: NEGATIVE
Nitrite: NEGATIVE
Protein, ur: 30 mg/dL — AB
Specific Gravity, Urine: 1.034 — ABNORMAL HIGH (ref 1.005–1.030)
pH: 5 (ref 5.0–8.0)

## 2021-01-20 LAB — CBC
HCT: 38.2 % (ref 36.0–46.0)
Hemoglobin: 12 g/dL (ref 12.0–15.0)
MCH: 26 pg (ref 26.0–34.0)
MCHC: 31.4 g/dL (ref 30.0–36.0)
MCV: 82.9 fL (ref 80.0–100.0)
Platelets: 275 10*3/uL (ref 150–400)
RBC: 4.61 MIL/uL (ref 3.87–5.11)
RDW: 13.3 % (ref 11.5–15.5)
WBC: 5.1 10*3/uL (ref 4.0–10.5)
nRBC: 0 % (ref 0.0–0.2)

## 2021-01-20 LAB — APTT: aPTT: 32 seconds (ref 24–36)

## 2021-01-20 LAB — HEPARIN LEVEL (UNFRACTIONATED): Heparin Unfractionated: 0.6 IU/mL (ref 0.30–0.70)

## 2021-01-20 LAB — PROTIME-INR
INR: 1 (ref 0.8–1.2)
Prothrombin Time: 12.6 seconds (ref 11.4–15.2)

## 2021-01-20 LAB — D-DIMER, QUANTITATIVE: D-Dimer, Quant: 2.86 ug/mL-FEU — ABNORMAL HIGH (ref 0.00–0.50)

## 2021-01-20 LAB — POC URINE PREG, ED: Preg Test, Ur: NEGATIVE

## 2021-01-20 MED ORDER — IOHEXOL 350 MG/ML SOLN
75.0000 mL | Freq: Once | INTRAVENOUS | Status: AC | PRN
  Administered 2021-01-20: 75 mL via INTRAVENOUS

## 2021-01-20 MED ORDER — HEPARIN BOLUS VIA INFUSION
6000.0000 [IU] | Freq: Once | INTRAVENOUS | Status: AC
  Administered 2021-01-20: 6000 [IU] via INTRAVENOUS
  Filled 2021-01-20: qty 6000

## 2021-01-20 MED ORDER — PROMETHAZINE HCL 25 MG PO TABS
25.0000 mg | ORAL_TABLET | Freq: Three times a day (TID) | ORAL | Status: DC | PRN
  Filled 2021-01-20: qty 1

## 2021-01-20 MED ORDER — ASCORBIC ACID 500 MG PO TABS
500.0000 mg | ORAL_TABLET | Freq: Every day | ORAL | Status: DC
  Administered 2021-01-20 – 2021-01-23 (×4): 500 mg via ORAL
  Filled 2021-01-20 (×4): qty 1

## 2021-01-20 MED ORDER — ALBUTEROL SULFATE HFA 108 (90 BASE) MCG/ACT IN AERS
2.0000 | INHALATION_SPRAY | RESPIRATORY_TRACT | Status: DC | PRN
  Filled 2021-01-20: qty 6.7

## 2021-01-20 MED ORDER — HEPARIN BOLUS VIA INFUSION
6000.0000 [IU] | Freq: Once | INTRAVENOUS | Status: DC
  Filled 2021-01-20: qty 6000

## 2021-01-20 MED ORDER — HEPARIN (PORCINE) 25000 UT/250ML-% IV SOLN
1600.0000 [IU]/h | INTRAVENOUS | Status: DC
  Administered 2021-01-20 – 2021-01-21 (×3): 1600 [IU]/h via INTRAVENOUS
  Filled 2021-01-20 (×3): qty 250

## 2021-01-20 MED ORDER — ACETAMINOPHEN 325 MG PO TABS
650.0000 mg | ORAL_TABLET | Freq: Four times a day (QID) | ORAL | Status: DC | PRN
  Administered 2021-01-21: 650 mg via ORAL
  Filled 2021-01-20 (×2): qty 2

## 2021-01-20 MED ORDER — FERROUS SULFATE 325 (65 FE) MG PO TABS
325.0000 mg | ORAL_TABLET | Freq: Every day | ORAL | Status: DC
  Administered 2021-01-20 – 2021-01-23 (×4): 325 mg via ORAL
  Filled 2021-01-20 (×4): qty 1

## 2021-01-20 MED ORDER — HEPARIN (PORCINE) 25000 UT/250ML-% IV SOLN: 1600.0000 [IU]/h | INTRAVENOUS | Status: DC

## 2021-01-20 MED ORDER — ADULT MULTIVITAMIN W/MINERALS CH
1.0000 | ORAL_TABLET | Freq: Every day | ORAL | Status: DC
  Administered 2021-01-20 – 2021-01-23 (×4): 1 via ORAL
  Filled 2021-01-20 (×4): qty 1

## 2021-01-20 MED ORDER — BUDESONIDE 0.25 MG/2ML IN SUSP
0.2500 mg | Freq: Every day | RESPIRATORY_TRACT | Status: DC
  Administered 2021-01-21 – 2021-01-23 (×3): 0.25 mg via RESPIRATORY_TRACT
  Filled 2021-01-20 (×3): qty 2

## 2021-01-20 MED ORDER — DM-GUAIFENESIN ER 30-600 MG PO TB12
1.0000 | ORAL_TABLET | Freq: Two times a day (BID) | ORAL | Status: DC | PRN
  Filled 2021-01-20: qty 1

## 2021-01-20 MED ORDER — ONDANSETRON HCL 4 MG/2ML IJ SOLN: 4.0000 mg | Freq: Three times a day (TID) | INTRAMUSCULAR | Status: DC | PRN

## 2021-01-20 MED ORDER — HEPARIN SODIUM (PORCINE) 5000 UNIT/ML IJ SOLN: 4000.0000 [IU] | Freq: Once | INTRAMUSCULAR | Status: DC

## 2021-01-20 MED ORDER — VITAMIN B-12 1000 MCG PO TABS
1000.0000 ug | ORAL_TABLET | Freq: Every day | ORAL | Status: DC
  Administered 2021-01-20 – 2021-01-23 (×4): 1000 ug via ORAL
  Filled 2021-01-20 (×3): qty 1

## 2021-01-20 NOTE — ED Notes (Signed)
Patient transported to CT 

## 2021-01-20 NOTE — ED Notes (Signed)
Patient transported to X-ray 

## 2021-01-20 NOTE — Progress Notes (Addendum)
Virtual Visit Note  I connected with Tara Shea on 01/20/21 at 11:40 AM EST by phone and verified that I am speaking with the correct person using two identifiers.  Location: Patient: home Provider: [facility]   I discussed the limitations of evaluation and management by telemedicine and the availability of in person appointments. The patient expressed understanding and agreed to proceed.  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 No missed doses.  - only with exertion, noted at PT last week was getting winded with leg stretches. Got progressively worse over the weekend and progressed to shortness of breath and wheezing with normal activities at home and work. 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   - noted happened before when iron got low. - denies preceding leg pain.  Duration: 1 week, progressive Onset: gradual Related to exertion: yes Cough: no Chest tightness: no Wheezing: yes with exertion, albuterol didn't help Fevers: no Chest pain: no Palpitations: no  Nausea: yes Diaphoresis: no Status: worse Aggravating factors: movement, walking Alleviating factors: rest Treatments attempted: albuterol, flovent.    Observations/Objective:  Entirety of visit conducted over the phone.  Speaks in full sentences, no respiratory distress.  Assessment and Plan:  Shortness of breath Progressively worsening over the past week with exertion and hypoxia, refractory to bronchodilator. Certainly concern for PE given h/o prior DVT and hypercoagulable state though no missed doses of anticoagulation reported. No fever, pain, or other URI symptoms to suggest PNA, COVID. Recently ended menstrual cycle and not on iron supplements currently, anemia may be contributing. No chest pain and no other cardiac risk factors, ACS unlikely. Given  concern for potential acute PE, recommended presentation to ED for likely CTA and further workup. Patient verbalized understanding.    I discussed the assessment and treatment plan with the patient. The patient was provided an opportunity to ask questions and all were answered. The patient agreed with the plan and demonstrated an understanding of the instructions.   The patient was advised to call back or seek an in-person evaluation if the symptoms worsen or if the condition fails to improve as anticipated.  I provided 10 minutes of non-face-to-face time during this encounter.   Myles Gip, DO

## 2021-01-20 NOTE — ED Notes (Signed)
Husband at bedside.  

## 2021-01-20 NOTE — Patient Instructions (Signed)
Our plans for today:  - Given concern for potential acute PE, I recommend presenting to the ED. - We will plan to see you in clinic for follow up afterwards.   Take care and seek immediate care sooner if you develop any concerns.   Dr. Ky Barban

## 2021-01-20 NOTE — Consult Note (Signed)
ANTICOAGULATION CONSULT NOTE - Follow Up Consult  Pharmacy Consult for heparin Indication: pulmonary embolus w/ RH strain  Allergies  Allergen Reactions  . Covid-19 (Mrna) Vaccine Itching and Other (See Comments)    Lip swelling and tingling, throat itching, itching and redness on face, scalp and chest.     Patient Measurements: Weight: (!) 140.6 kg (310 lb) Heparin Dosing Weight: 98.1 kg  Vital Signs: Temp: 97.7 F (36.5 C) (02/28 1223) Temp Source: Oral (02/28 1223) BP: 147/112 (02/28 1223) Pulse Rate: 70 (02/28 1223)  Labs: Recent Labs    01/20/21 1248  HGB 12.0  HCT 38.2  PLT 275  CREATININE 0.75    Estimated Creatinine Clearance: 139.6 mL/min (by C-G formula based on SCr of 0.75 mg/dL).   Medications:  PTA: lovenox 120mg  q12h (last dose 2/27 ~2100) Heparin Dosing Weight: 98.1 kg  Assessment: 40yo Female with history of factor V Leiden (heterozygous - lovenox PTA), history of DVT, obesity & PCOS presenting with SOB and tachycardia (HR 150s). Pharmacy consulted for the management of heparin gtt ISO PE with right heart strain.  Baseline:  H/H/Plts WNL;  INR 1.0; aPTT 32s; D-Dimer 2.86  Date Time HL Rate/Comment  Goal of Therapy:  Heparin level 0.3-0.7 units/ml Monitor platelets by anticoagulation protocol: Yes   Plan:  ** last lovenox dose 120mg  given 2/27 2100. Give 6000 units bolus x 1 Start heparin infusion at 1600 units/hr Check anti-Xa level in 6 hours and daily while on heparin Continue to monitor H&H and platelets  Lorna Dibble 01/20/2021,2:42 PM

## 2021-01-20 NOTE — Progress Notes (Incomplete)
*  PRELIMINARY RESULTS* Echocardiogram 2D Echocardiogram has been performed.  Jonette Mate Roar 01/20/2021, 8:09 PM

## 2021-01-20 NOTE — Progress Notes (Signed)
Patient arrived to the unit from ED via stretcher  Assisted back to bed and made comfortable. Initial assessment done  Pt a x o x 4 admitted for PE and DVT in the left leg. Pt denied SOB or CP at present VS stable  Cardiac monitor placed & verification completed 1 C 40 X 41 .Heparin drip infusing at 16 gtt.. hand off verification done.

## 2021-01-20 NOTE — H&P (Signed)
History and Physical    Tara Shea UEA:[id number] DOB: 1981/04/21 DOA: 01/20/2021  Referring MD/NP/PA:   PCP: [place]   Patient coming from:  The patient is coming from home.  At baseline, pt is independent for most of ADL.        Chief Complaint: SOB  HPI: Tara Shea is a 41 y.o. female with medical history significant of factor V Leiden and history of bilateral leg DVT, PCOS, asthma, iron deficiency anemia, fibroid, migraine headache, who presents with shortness breath.   Patient states that she has history of bilateral PE, and currently she is taking Lovenox, but she states that she missed a couple of doses of Lovenox recently.  She developed shortness of breath at about 1 week ago.  Her shortness breath aggravated by exertion.  She also has tachycardia, with HR up to 150s at one time.  No chest pain, cough, fever or chills.  She reports that she had oxygen desaturation to 89% yesterday.  Current oxygen saturation 97% on room air in the ED. Denies symptoms of calf pain or swelling. Patient denies nausea, vomiting, diarrhea or abdominal pain.  ED Course: pt was found to have D-dimer 2.86, pending COVID-19 PCR, electrolytes renal function okay, temperature normal, blood pressure 147/112, heart rate 70, RR 18, oxygen saturation 97% on room air.  CT angiogram is positive for bilateral submassive PE with CT evidence of right heart strain. Lower extremity Doppler is positive for left leg DVT involving the left popliteal vein, posterior tibial vein, and peroneal veins. Patient is placed on MedSurg bed for position. VVS, Dr. Delana Meyer is consulted.    Review of Systems:   General: no fevers, chills, no body weight gain, fatigue HEENT: no blurry vision, hearing changes or sore throat Respiratory: has dyspnea, no coughing, wheezing CV: no chest pain, no palpitations GI: no nausea, vomiting, abdominal pain, diarrhea, constipation GU: no dysuria, burning  on urination, increased urinary frequency, hematuria  Ext: no leg edema Neuro: no unilateral weakness, numbness, or tingling, no vision change or hearing loss Skin: no rash, no skin tear. MSK: No muscle spasm, no deformity, no limitation of range of movement in spin Heme: No easy bruising.  Travel history: No recent long distant travel.  Allergy:  Allergies  Allergen Reactions  . Covid-19 (Mrna) Vaccine Itching and Other (See Comments)    Lip swelling and tingling, throat itching, itching and redness on face, scalp and chest.     Past Medical History:  Diagnosis Date  . Asthma   . Bone spur   . Cholelithiasis 07/26/2019  . Complication of anesthesia    pt had hypotensive episode and has had n & v  . DVT (deep venous thrombosis) ([place]) 03/2019   x 1 and DEC 2011  . Factor 5 Leiden mutation, heterozygous ([place])    stopped Xarelto 08/23/19  . Fibroid   . History of kidney stones   . IDA (iron deficiency anemia)    vitamin b12 and iron deficiency  . Migraines   . PCOS (polycystic ovarian syndrome)   . PCOS (polycystic ovarian syndrome)     Past Surgical History:  Procedure Laterality Date  . CHOLECYSTECTOMY N/A 07/28/2019   Procedure: LAPAROSCOPIC CHOLECYSTECTOMY;  Surgeon: Jules Husbands, MD;  Location: [facility] ORS;  Service: General;  Laterality: N/A;  . HYSTEROSCOPY WITH D & C N/A 08/31/2019   Procedure: DILATATION AND CURETTAGE /HYSTEROSCOPY/ SUBMUCOSAL MYOMECTOMY;  Surgeon: Will Bonnet, MD;  Location: [facility] ORS;  Service: Gynecology;  Laterality: N/A;  . HYSTEROSCOPY WITH D & C N/A 10/03/2019   Procedure: DILATATION AND CURETTAGE /HYSTEROSCOPY/ SUBMUCOSAL MYOMECTOMY;  Surgeon: Will Bonnet, MD;  Location: [facility] ORS;  Service: Gynecology;  Laterality: N/A;  . HYSTEROSCOPY WITH D & C N/A 10/26/2019   Procedure: DILATATION AND CURETTAGE /HYSTEROSCOPY, SUBMUCOSAL MYOMECTOMY;  Surgeon: Will Bonnet, MD;  Location: [facility] ORS;  Service: Gynecology;  Laterality: N/A;  .  MYOMECTOMY    . TONSILLECTOMY    . WISDOM TOOTH EXTRACTION    . WRIST SURGERY     age 40 - rebroke it b/c it grew back wrong    Social History:  reports that she has never smoked. She has never used smokeless tobacco. She reports current alcohol use. She reports that she does not use drugs.  Family History:  Family History  Problem Relation Age of Onset  . Heart murmur Mother   . Thyroid disease Mother   . Migraines Mother   . Asthma Mother   . Diabetes Mother        TYPE 2  . Bipolar disorder Mother   . Breast cancer Maternal Aunt 33  . Cancer Maternal Uncle        skin  . Cancer Maternal Grandmother 78       colon  . Hypertension Maternal Grandmother   . Cancer Maternal Aunt 50       colon  . Diabetes Maternal Aunt      Prior to Admission medications   Medication Sig Start Date End Date Taking? Authorizing Provider  albuterol (PROVENTIL) (2.5 MG/3ML) 0.083% nebulizer solution Take 3 mLs (2.5 mg total) by nebulization every 6 (six) hours as needed for wheezing or shortness of breath. 11/28/18   Luvenia Redden, PA-C  albuterol (VENTOLIN HFA) 108 (90 Base) MCG/ACT inhaler Inhale 2 puffs into the lungs every 4 (four) hours as needed for wheezing or shortness of breath. 10/03/19   Hubbard Hartshorn, FNP  enoxaparin (LOVENOX) 120 MG/0.8ML injection Inject 0.8 mLs (120 mg total) into the skin every 12 (twelve) hours. 03/20/20   Cammie Sickle, MD  ferrous sulfate 325 (65 FE) MG tablet Take 325 mg by mouth daily.    [provider]  fluticasone (FLOVENT HFA) 220 MCG/ACT inhaler  11/09/19   [provider]  FOLIC ACID PO Take by mouth.    [provider]  letrozole (FEMARA) 2.5 MG tablet TAKE 3 TABLETS (7.5 MG) BY MOUTH DAILY. TAKE ON DAYS 5-9 OF YOUR MENSTRUAL CYCLE EACH MONTH. 11/26/20   Rubie Maid, MD  Multiple Vitamins-Minerals (MULTIVITAMIN WITH MINERALS) tablet Take 1 tablet by mouth daily.     [provider]  ondansetron (ZOFRAN ODT) 4  MG disintegrating tablet Take 1 tablet (4 mg total) by mouth every 6 (six) hours as needed for nausea. 11/22/19   Hubbard Hartshorn, FNP  promethazine (PHENERGAN) 25 MG tablet Take 1 tablet (25 mg total) by mouth every 8 (eight) hours as needed for nausea or vomiting. 11/22/19   Hubbard Hartshorn, FNP  Spacer/Aero-Holding Chambers (AEROCHAMBER PLUS) inhaler Use as instructed 05/08/17   Melynda Ripple, MD  vitamin B-12 (CYANOCOBALAMIN) 1000 MCG tablet Take 1 tablet (1,000 mcg total) by mouth daily. 07/29/19   Mayo, Pete Pelt, MD  vitamin C (ASCORBIC ACID) 500 MG tablet Take 500 mg by mouth daily.    [provider]    Physical Exam: Vitals:   01/20/21 1223 01/20/21 1224 01/20/21 1553  BP: Marland Kitchen)  147/112  126/89  Pulse: 70  74  Resp: 18  18  Temp: 97.7 F (36.5 C)    TempSrc: Oral    SpO2: 97%  97%  Weight:  (!) 140.6 kg   Height:  5\' 8"  (1.727 m)    General: Not in acute distress HEENT:       Eyes: PERRL, EOMI, no scleral icterus.       ENT: No discharge from the ears and nose, no pharynx injection, no tonsillar enlargement.        Neck: No JVD, no bruit, no mass felt. Heme: No neck lymph node enlargement. Cardiac: S1/S2, RRR, No murmurs, No gallops or rubs. Respiratory: No rales, wheezing, rhonchi or rubs. GI: Soft, nondistended, nontender, no rebound pain, no organomegaly, BS present. GU: No hematuria Ext: No pitting leg edema bilaterally. 1+DP/PT pulse bilaterally. Musculoskeletal: No joint deformities, No joint redness or warmth, no limitation of ROM in spin. Skin: No rashes.  Neuro: Alert, oriented X3, cranial nerves II-XII grossly intact, moves all extremities normally. Psych: Patient is not psychotic, no suicidal or hemocidal ideation.  Labs on Admission: I have personally reviewed following labs and imaging studies  CBC: Recent Labs  Lab 01/20/21 1248  WBC 5.1  HGB 12.0  HCT 38.2  MCV 82.9  PLT 382   Basic Metabolic Panel: Recent Labs  Lab 01/20/21 1248  NA  136  K 3.5  CL 106  CO2 21*  GLUCOSE 112*  BUN 16  CREATININE 0.75  CALCIUM 9.0   GFR: Estimated Creatinine Clearance: 139.6 mL/min (by C-G formula based on SCr of 0.75 mg/dL). Liver Function Tests: No results for input(s): AST, ALT, ALKPHOS, BILITOT, PROT, ALBUMIN in the last 168 hours. No results for input(s): LIPASE, AMYLASE in the last 168 hours. No results for input(s): AMMONIA in the last 168 hours. Coagulation Profile: Recent Labs  Lab 01/20/21 1248  INR 1.0   Cardiac Enzymes: No results for input(s): CKTOTAL, CKMB, CKMBINDEX, TROPONINI in the last 168 hours. BNP (last 3 results) No results for input(s): PROBNP in the last 8760 hours. HbA1C: No results for input(s): HGBA1C in the last 72 hours. CBG: No results for input(s): GLUCAP in the last 168 hours. Lipid Profile: No results for input(s): CHOL, HDL, LDLCALC, TRIG, CHOLHDL, LDLDIRECT in the last 72 hours. Thyroid Function Tests: No results for input(s): TSH, T4TOTAL, FREET4, T3FREE, THYROIDAB in the last 72 hours. Anemia Panel: No results for input(s): VITAMINB12, FOLATE, FERRITIN, TIBC, IRON, RETICCTPCT in the last 72 hours. Urine analysis:    Component Value Date/Time   COLORURINE AMBER (A) 01/20/2021 1226   APPEARANCEUR CLOUDY (A) 01/20/2021 1226   APPEARANCEUR Cloudy 09/14/2013 1947   LABSPEC 1.034 (H) 01/20/2021 1226   LABSPEC 1.034 09/14/2013 1947   PHURINE 5.0 01/20/2021 1226   GLUCOSEU NEGATIVE 01/20/2021 1226   GLUCOSEU Negative 09/14/2013 1947   HGBUR SMALL (A) 01/20/2021 [place] 01/20/2021 1226   BILIRUBINUR small 03/22/2019 1151   BILIRUBINUR Negative 09/14/2013 1947   KETONESUR 5 (A) 01/20/2021 1226   PROTEINUR 30 (A) 01/20/2021 1226   UROBILINOGEN negative (A) 03/22/2019 1151   NITRITE NEGATIVE 01/20/2021 1226   LEUKOCYTESUR NEGATIVE 01/20/2021 1226   LEUKOCYTESUR Negative 09/14/2013 1947   Sepsis Labs: @LABRCNTIP (procalcitonin:4,lacticidven:4) )No results found  for this or any previous visit (from the past 240 hour(s)).   Radiological Exams on Admission: DG Chest 2 View  Result Date: 01/20/2021 CLINICAL DATA:  Dyspnea on exertion. EXAM: CHEST - 2 VIEW COMPARISON:  None. FINDINGS: The heart size and mediastinal contours are within normal limits. Both lungs are clear. The visualized skeletal structures are unremarkable. IMPRESSION: No active cardiopulmonary disease. Electronically Signed   By: Franchot Gallo M.D.   On: 01/20/2021 13:29   CT Angio Chest PE W and/or Wo Contrast  Result Date: 01/20/2021 CLINICAL DATA:  Evaluate for acute pulmonary embolus.  Low EXAM: CT ANGIOGRAPHY CHEST WITH CONTRAST TECHNIQUE: Multidetector CT imaging of the chest was performed using the standard protocol during bolus administration of intravenous contrast. Multiplanar CT image reconstructions and MIPs were obtained to evaluate the vascular anatomy. CONTRAST:  75mL OMNIPAQUE IOHEXOL 350 MG/ML SOLN COMPARISON:  Chest x-ray from earlier today FINDINGS: Cardiovascular: Examination is positive for acute pulmonary embolus. Large bilateral filling defects are noted within the distal right mainstem artery as well as the right lower lobar and segmental pulmonary arteries clot is also identified within the left lower lobe are and left lower segmental and subsegmental pulmonary arteries. Positive for acute PE with CTevidence of right heart strain (RV/LV Ratio = 1.04) consistent with at least submassive (intermediate risk) PE. The presence of right heart strain has been associated with an increased risk of morbidity and mortality. No pericardial effusion identified. Mediastinum/Nodes: No enlarged mediastinal, hilar, or axillary lymph nodes. Thyroid gland, trachea, and esophagus demonstrate no significant findings. Lungs/Pleura: Lungs are clear. No pleural effusion or pneumothorax. Upper Abdomen: No acute abnormality.  Previous cholecystectomy. Musculoskeletal: No chest wall abnormality. No  acute or significant osseous findings. Review of the MIP images confirms the above findings. IMPRESSION: Examination is positive for acute pulmonary embolus with CTevidence of right heart strain (RV/LV Ratio 1.04) consistent with at least submassive (intermediate risk) PE. The presence of right heart strain has been associated with an increased risk of morbidity and mortality. Critical Value/emergent results were called by telephone at the time of interpretation on 01/20/2021 at 2:05 pm to provider [facility] , who verbally acknowledged these results. Electronically Signed   By: Kerby Moors M.D.   On: 01/20/2021 14:06   US Venous Img Lower Bilateral (DVT)  Result Date: 01/20/2021 CLINICAL DATA:  Acute PE, shortness of breath. EXAM: BILATERAL LOWER EXTREMITY VENOUS DOPPLER ULTRASOUND TECHNIQUE: Gray-scale sonography with graded compression, as well as color Doppler and duplex ultrasound were performed to evaluate the lower extremity deep venous systems from the level of the common femoral vein and including the common femoral, femoral, profunda femoral, popliteal and calf veins including the posterior tibial, peroneal and gastrocnemius veins when visible. The superficial great saphenous vein was also interrogated. Spectral Doppler was utilized to evaluate flow at rest and with distal augmentation maneuvers in the common femoral, femoral and popliteal veins. COMPARISON:  November 15, 2019. FINDINGS: RIGHT LOWER EXTREMITY Common Femoral Vein: No evidence of thrombus. Normal compressibility, respiratory phasicity and response to augmentation. Saphenofemoral Junction: No evidence of thrombus. Normal compressibility and flow on color Doppler imaging. Profunda Femoral Vein: No evidence of thrombus. Normal compressibility and flow on color Doppler imaging. Femoral Vein: No evidence of thrombus. Normal compressibility, respiratory phasicity and response to augmentation. Popliteal Vein: No evidence of thrombus. Normal  compressibility, respiratory phasicity and response to augmentation. Calf Veins: No evidence of thrombus. Normal compressibility and flow on color Doppler imaging. Venous Reflux:  None. LEFT LOWER EXTREMITY Common Femoral Vein: No evidence of thrombus. Normal compressibility, respiratory phasicity and response to augmentation. Saphenofemoral Junction: No evidence of thrombus. Normal compressibility and flow on color Doppler imaging. Profunda Femoral Vein: No evidence of thrombus.  Normal compressibility and flow on color Doppler imaging. Femoral Vein: No evidence of thrombus. Normal compressibility, respiratory phasicity and response to augmentation. Popliteal Vein: Nonocclusive thrombus.  Diminished compressibility. Calf Veins: Occlusive thrombus involving the posterior tibial vein and peroneal vein. Diminished compressibility. Venous Reflux:  None. IMPRESSION: Evidence of deep venous thrombosis involving the left popliteal vein, posterior tibial vein, and peroneal veins. These results will be called to the ordering clinician or representative by the Radiologist Assistant, and communication documented in the PACS or [employer]. Electronically Signed   By: Margaretha Sheffield MD   On: 01/20/2021 17:09     EKG: I have personally reviewed.  Sinus rhythm, tachycardia, QTC 460, low voltage.   Assessment/Plan Principal Problem:   Acute pulmonary embolus (HCC) Active Problems:   Asthma, mild intermittent   Factor V Leiden mutation (HCC)   Iron deficiency anemia due to chronic blood loss   DVT (deep venous thrombosis) (HCC)   Acute pulmonary embolism (HCC)   Acute pulmonary embolus and DVT: CT scan showed submassive PE with CT evidence of right heart strain.  Dr. Delana Meyer of vascular surgery is consulted. -Placed on MedSurg bed for position -IV heparin started -As needed albuterol  Asthma, mild intermittent -Bronchodilators  Factor V Leiden mutation (HCC) -IV heparin  Iron deficiency anemia  due to chronic blood loss: Hemoglobin stable, 12.0. -Continue iron supplement      DVT ppx: on IV Heparin     Code Status: Full code Family Communication:  Yes, patient's husband at bed side Disposition Plan:  Anticipate discharge back to previous environment Consults called:  Dr. Delana Meyer Admission status and Level of care: Med-Surg:    Med-surg bed for obs   Status is: Observation  The patient remains OBS appropriate and will d/c before 2 midnights.  Dispo: The patient is from: Home              Anticipated d/c is to: Home              Patient currently is not medically stable to d/c.   Difficult to place patient No           Date of Service 01/20/2021    [facility]   If 7PM-7AM, please contact night-coverage [email or web address] 01/20/2021, 5:41 PM

## 2021-01-20 NOTE — Consult Note (Signed)
ANTICOAGULATION CONSULT NOTE - Follow Up Consult  Pharmacy Consult for heparin Indication: pulmonary embolus w/ RH strain  Allergies  Allergen Reactions  . Covid-19 (Mrna) Vaccine Itching and Other (See Comments)    Lip swelling and tingling, throat itching, itching and redness on face, scalp and chest.     Patient Measurements: Height: 5\' 8"  (172.7 cm) Weight: (!) 140.6 kg (309 lb 15.5 oz) IBW/kg (Calculated) : 63.9 Heparin Dosing Weight: 98.1 kg  Vital Signs: Temp: 98.6 F (37 C) (02/28 1929) Temp Source: Oral (02/28 1223) BP: 138/87 (02/28 1929) Pulse Rate: 86 (02/28 1929)  Labs: Recent Labs    01/20/21 1248 01/20/21 2107  HGB 12.0  --   HCT 38.2  --   PLT 275  --   APTT 32  --   LABPROT 12.6  --   INR 1.0  --   HEPARINUNFRC  --  0.60  CREATININE 0.75  --     Estimated Creatinine Clearance: 139.6 mL/min (by C-G formula based on SCr of 0.75 mg/dL).   Medications:  PTA: lovenox 120mg  q12h (last dose 2/27 ~2100) Heparin Dosing Weight: 98.1 kg  Assessment: 40yo Female with history of factor V Leiden (heterozygous - lovenox PTA), history of DVT, obesity & PCOS presenting with SOB and tachycardia (HR 150s). Pharmacy consulted for the management of heparin gtt ISO PE with right heart strain.  Baseline:  H/H/Plts WNL;  INR 1.0; aPTT 32s; D-Dimer 2.86  Date Time HL Rate/Comment 2/28 2107 0.6 1600 units/hr  Goal of Therapy:  Heparin level 0.3-0.7 units/ml Monitor platelets by anticoagulation protocol: Yes   Plan:  ** last lovenox dose 120mg  given 2/27 2100. Heparin level therapeutic, continue heparin infusion at 1600 units/hr Check anti-Xa level in 6 hours and daily while on heparin Continue to monitor H&H and platelets  Darnelle Bos, PharmD 01/20/2021,10:19 PM

## 2021-01-20 NOTE — ED Provider Notes (Addendum)
[facility] Emergency Department Provider Note ____________________________________________   Event Date/Time   First MD Initiated Contact with Patient 01/20/21 1236     (approximate)  I have reviewed the triage vital signs and the nursing notes.  HISTORY  Chief Complaint Shortness of Breath and Tachycardia   HPI Tara Shea is a 40 y.o. femalewho presents to the ED for evaluation of shortness of breath and tachycardia.  Chart review indicates history of factor V Leiden and history of DVT.  Further history of obesity, PCOS.  Patient works as an Health visitor here locally.  Patient presents to the ED for evaluation of 1 week of dyspnea on exertion, tachycardia, and concern for PE.  Patient reports that she takes Lovenox these days, and does report that she has missed "a couple doses" recently.  She reports 1 week of dyspnea on exertion with associated tachycardia up to 150.  She denies any chest pain or pressure, denies any syncopal episodes or pleuritic pains.  Denies any symptoms of a DVT, denies leg swelling, discoloration or pain.  Patient reports that she feels fine while seated and has no complaints now.  She had a virtual visit with her PCP today, and was told to go to the ED for evaluation.  Past Medical History:  Diagnosis Date  . Asthma   . Bone spur   . Cholelithiasis 07/26/2019  . Complication of anesthesia    pt had hypotensive episode and has had n & v  . DVT (deep venous thrombosis) ([place]) 03/2019   x 1 and DEC 2011  . Factor 5 Leiden mutation, heterozygous ([place])    stopped Xarelto 08/23/19  . Fibroid   . History of kidney stones   . IDA (iron deficiency anemia)    vitamin b12 and iron deficiency  . Migraines   . PCOS (polycystic ovarian syndrome)   . PCOS (polycystic ovarian syndrome)     Patient Active Problem List   Diagnosis Date Noted  . Shortness of breath 01/20/2021  . Acute pulmonary embolus ([place]) 01/20/2021  . DVT (deep  venous thrombosis) ([place]) 01/20/2021  . Acute pulmonary embolism ([place]) 01/20/2021  . Infertility, female 11/26/2020  . PCOS (polycystic ovarian syndrome) 11/26/2020  . Intramural and submucous leiomyoma of uterus 08/31/2019  . Menorrhagia with irregular cycle 08/31/2019  . Dysmenorrhea 08/31/2019  . B12 deficiency 08/09/2019  . Iron deficiency anemia due to chronic blood loss 06/22/2019  . Acute deep vein thrombosis (DVT) of calf muscle vein of right lower extremity ([place]) 04/18/2019  . Factor V Leiden mutation ([place])   . Allergic rhinitis 03/01/2019  . Kidney stone 08/02/2018  . Asthma, mild intermittent 08/02/2018  . Migraine headache with aura 08/02/2018  . Morbid obesity ([place]) 08/02/2018    Past Surgical History:  Procedure Laterality Date  . CHOLECYSTECTOMY N/A 07/28/2019   Procedure: LAPAROSCOPIC CHOLECYSTECTOMY;  Surgeon: Jules Husbands, MD;  Location: [facility] ORS;  Service: General;  Laterality: N/A;  . HYSTEROSCOPY WITH D & C N/A 08/31/2019   Procedure: DILATATION AND CURETTAGE /HYSTEROSCOPY/ SUBMUCOSAL MYOMECTOMY;  Surgeon: Will Bonnet, MD;  Location: [facility] ORS;  Service: Gynecology;  Laterality: N/A;  . HYSTEROSCOPY WITH D & C N/A 10/03/2019   Procedure: DILATATION AND CURETTAGE /HYSTEROSCOPY/ SUBMUCOSAL MYOMECTOMY;  Surgeon: Will Bonnet, MD;  Location: [facility] ORS;  Service: Gynecology;  Laterality: N/A;  . HYSTEROSCOPY WITH D & C N/A 10/26/2019   Procedure: DILATATION AND CURETTAGE /HYSTEROSCOPY, SUBMUCOSAL MYOMECTOMY;  Surgeon: Will Bonnet, MD;  Location: [facility] ORS;  Service: Gynecology;  Laterality: N/A;  . MYOMECTOMY    . TONSILLECTOMY    . WISDOM TOOTH EXTRACTION    . WRIST SURGERY     age 28 - rebroke it b/c it grew back wrong    Prior to Admission medications   Medication Sig Start Date End Date Taking? Authorizing Provider  albuterol (PROVENTIL) (2.5 MG/3ML) 0.083% nebulizer solution Take 3 mLs (2.5 mg total) by nebulization every 6 (six) hours as needed  for wheezing or shortness of breath. 11/28/18   Luvenia Redden, PA-C  albuterol (VENTOLIN HFA) 108 (90 Base) MCG/ACT inhaler Inhale 2 puffs into the lungs every 4 (four) hours as needed for wheezing or shortness of breath. 10/03/19   Hubbard Hartshorn, FNP  enoxaparin (LOVENOX) 120 MG/0.8ML injection Inject 0.8 mLs (120 mg total) into the skin every 12 (twelve) hours. 03/20/20   Cammie Sickle, MD  ferrous sulfate 325 (65 FE) MG tablet Take 325 mg by mouth daily.    [provider]  fluticasone (FLOVENT HFA) 220 MCG/ACT inhaler  11/09/19   [provider]  FOLIC ACID PO Take by mouth.    [provider]  letrozole (FEMARA) 2.5 MG tablet TAKE 3 TABLETS (7.5 MG) BY MOUTH DAILY. TAKE ON DAYS 5-9 OF YOUR MENSTRUAL CYCLE EACH MONTH. 11/26/20   Rubie Maid, MD  Multiple Vitamins-Minerals (MULTIVITAMIN WITH MINERALS) tablet Take 1 tablet by mouth daily.     [provider]  ondansetron (ZOFRAN ODT) 4 MG disintegrating tablet Take 1 tablet (4 mg total) by mouth every 6 (six) hours as needed for nausea. 11/22/19   Hubbard Hartshorn, FNP  promethazine (PHENERGAN) 25 MG tablet Take 1 tablet (25 mg total) by mouth every 8 (eight) hours as needed for nausea or vomiting. 11/22/19   Hubbard Hartshorn, FNP  Spacer/Aero-Holding Chambers (AEROCHAMBER PLUS) inhaler Use as instructed 05/08/17   Melynda Ripple, MD  vitamin B-12 (CYANOCOBALAMIN) 1000 MCG tablet Take 1 tablet (1,000 mcg total) by mouth daily. 07/29/19   Mayo, Pete Pelt, MD  vitamin C (ASCORBIC ACID) 500 MG tablet Take 500 mg by mouth daily.    [provider]    Allergies Covid-19 (mrna) vaccine  Family History  Problem Relation Age of Onset  . Heart murmur Mother   . Thyroid disease Mother   . Migraines Mother   . Asthma Mother   . Diabetes Mother        TYPE 2  . Bipolar disorder Mother   . Breast cancer Maternal Aunt 14  . Cancer Maternal Uncle        skin  . Cancer Maternal Grandmother 47        colon  . Hypertension Maternal Grandmother   . Cancer Maternal Aunt 50       colon  . Diabetes Maternal Aunt     Social History Social History   Tobacco Use  . Smoking status: Never Smoker  . Smokeless tobacco: Never Used  Vaping Use  . Vaping Use: Never used  Substance Use Topics  . Alcohol use: Yes    Comment: monthly  . Drug use: No    Review of Systems  Constitutional: No fever/chills Eyes: No visual changes. ENT: No sore throat. Cardiovascular: Denies chest pain.  Positive for exertional tachycardia. Respiratory: Positive for dyspnea on exertion Gastrointestinal: No abdominal pain.  No nausea, no vomiting.  No diarrhea.  No constipation. Genitourinary: Negative for dysuria. Musculoskeletal: Negative for back pain. Skin:  Negative for rash. Neurological: Negative for headaches, focal weakness or numbness.  ____________________________________________   PHYSICAL EXAM:  VITAL SIGNS: Vitals:   01/20/21 1223  BP: (!) 147/112  Pulse: 70  Resp: 18  Temp: 97.7 F (36.5 C)  SpO2: 97%     Constitutional: Alert and oriented. Well appearing and in no acute distress.  Obese.  Pleasant and conversational in full sentences without distress. Eyes: Conjunctivae are normal. PERRL. EOMI. Head: Atraumatic. Nose: No congestion/rhinnorhea. Mouth/Throat: Mucous membranes are moist.  Oropharynx non-erythematous. Neck: No stridor. No cervical spine tenderness to palpation. Cardiovascular: Normal rate, regular rhythm. Grossly normal heart sounds.  Good peripheral circulation. Respiratory: Normal respiratory effort.  No retractions. Lungs CTAB. Gastrointestinal: Soft , nondistended, nontender to palpation. No CVA tenderness. Musculoskeletal: No lower extremity tenderness nor edema.  No joint effusions. No signs of acute trauma. Bilateral lower extremities without any discoloration, tenderness or signs of trauma. Neurologic:  Normal speech and language. No gross focal neurologic  deficits are appreciated. No gait instability noted. Skin:  Skin is warm, dry and intact. No rash noted. Psychiatric: Mood and affect are normal. Speech and behavior are normal.  ____________________________________________   LABS (all labs ordered are listed, but only abnormal results are displayed)  Labs Reviewed  BASIC METABOLIC PANEL - Abnormal; Notable for the following components:      Result Value   CO2 21 (*)    Glucose, Bld 112 (*)    All other components within normal limits  URINALYSIS, COMPLETE (UACMP) WITH MICROSCOPIC - Abnormal; Notable for the following components:   Color, Urine AMBER (*)    APPearance CLOUDY (*)    Specific Gravity, Urine 1.034 (*)    Hgb urine dipstick SMALL (*)    Ketones, ur 5 (*)    Protein, ur 30 (*)    Bacteria, UA RARE (*)    All other components within normal limits  D-DIMER, QUANTITATIVE - Abnormal; Notable for the following components:   D-Dimer, Quant 2.86 (*)    All other components within normal limits  POC URINE PREG, ED - Normal  SARS CORONAVIRUS 2 (TAT 6-24 HRS)  CBC  APTT  PROTIME-INR  BRAIN NATRIURETIC PEPTIDE  HEPARIN LEVEL (UNFRACTIONATED)  CBG MONITORING, ED   ____________________________________________  12 Lead EKG  Sinus rhythm, rate of 92 bpm.  Normal axis and intervals.  No evidence of acute ischemia. ____________________________________________  RADIOLOGY  ED MD interpretation: 2 view CXR reviewed by me without evidence of acute cardiopulmonary pathology.  Official radiology report(s): DG Chest 2 View  Result Date: 01/20/2021 CLINICAL DATA:  Dyspnea on exertion. EXAM: CHEST - 2 VIEW COMPARISON:  None. FINDINGS: The heart size and mediastinal contours are within normal limits. Both lungs are clear. The visualized skeletal structures are unremarkable. IMPRESSION: No active cardiopulmonary disease. Electronically Signed   By: Franchot Gallo M.D.   On: 01/20/2021 13:29   CT Angio Chest PE W and/or Wo  Contrast  Result Date: 01/20/2021 CLINICAL DATA:  Evaluate for acute pulmonary embolus.  Low EXAM: CT ANGIOGRAPHY CHEST WITH CONTRAST TECHNIQUE: Multidetector CT imaging of the chest was performed using the standard protocol during bolus administration of intravenous contrast. Multiplanar CT image reconstructions and MIPs were obtained to evaluate the vascular anatomy. CONTRAST:  38mL OMNIPAQUE IOHEXOL 350 MG/ML SOLN COMPARISON:  Chest x-ray from earlier today FINDINGS: Cardiovascular: Examination is positive for acute pulmonary embolus. Large bilateral filling defects are noted within the distal right mainstem artery as well as the right lower lobar and  segmental pulmonary arteries clot is also identified within the left lower lobe are and left lower segmental and subsegmental pulmonary arteries. Positive for acute PE with CTevidence of right heart strain (RV/LV Ratio = 1.04) consistent with at least submassive (intermediate risk) PE. The presence of right heart strain has been associated with an increased risk of morbidity and mortality. No pericardial effusion identified. Mediastinum/Nodes: No enlarged mediastinal, hilar, or axillary lymph nodes. Thyroid gland, trachea, and esophagus demonstrate no significant findings. Lungs/Pleura: Lungs are clear. No pleural effusion or pneumothorax. Upper Abdomen: No acute abnormality.  Previous cholecystectomy. Musculoskeletal: No chest wall abnormality. No acute or significant osseous findings. Review of the MIP images confirms the above findings. IMPRESSION: Examination is positive for acute pulmonary embolus with CTevidence of right heart strain (RV/LV Ratio 1.04) consistent with at least submassive (intermediate risk) PE. The presence of right heart strain has been associated with an increased risk of morbidity and mortality. Critical Value/emergent results were called by telephone at the time of interpretation on 01/20/2021 at 2:05 pm to provider [facility] , who  verbally acknowledged these results. Electronically Signed   By: Kerby Moors M.D.   On: 01/20/2021 14:06    ____________________________________________   PROCEDURES and INTERVENTIONS  Procedure(s) performed (including Critical Care):  .1-3 Lead EKG Interpretation Performed by: Vladimir Crofts, MD Authorized by: Vladimir Crofts, MD     Interpretation: normal     ECG rate:  70   ECG rate assessment: normal     Rhythm: sinus rhythm     Ectopy: none     Conduction: normal   .Critical Care Performed by: Vladimir Crofts, MD Authorized by: Vladimir Crofts, MD   Critical care provider statement:    Critical care time (minutes):  45   Critical care was necessary to treat or prevent imminent or life-threatening deterioration of the following conditions:  Circulatory failure and cardiac failure   Critical care was time spent personally by me on the following activities:  Discussions with consultants, evaluation of patient's response to treatment, examination of patient, ordering and performing treatments and interventions, ordering and review of laboratory studies, ordering and review of radiographic studies, pulse oximetry, re-evaluation of patient's condition, obtaining history from patient or surrogate and review of old charts    Medications  ondansetron (ZOFRAN) injection 4 mg (has no administration in time range)  acetaminophen (TYLENOL) tablet 650 mg (has no administration in time range)  albuterol (VENTOLIN HFA) 108 (90 Base) MCG/ACT inhaler 2 puff (has no administration in time range)  dextromethorphan-guaiFENesin (MUCINEX DM) 30-600 MG per 12 hr tablet 1 tablet (has no administration in time range)  heparin ADULT infusion 100 units/mL (25000 units/275mL) (1,600 Units/hr Intravenous New Bag/Given 01/20/21 1529)  iohexol (OMNIPAQUE) 350 MG/ML injection 75 mL (75 mLs Intravenous Contrast Given 01/20/21 1343)  heparin bolus via infusion 6,000 Units (6,000 Units Intravenous Bolus from Bag  01/20/21 1531)    ____________________________________________   MDM / ED COURSE   40 year old woman with known factor V Leiden, but with medication noncompliance, this to the ED with exertional dyspnea and tachycardia with evidence of an acute PE requiring medical admission.  Normal vitals on room air while supine and seated in the ED.  She looks well without evidence of acute derangements during a recumbent examination.  She has no distress, tachypnea or dyspnea, no neurovascular deficits and no active chest pain.  Basic blood work is unremarkable.  Urine is concentrated without infectious features.  CXR demonstrates no infiltrates and EKG is  nonischemic.  D-dimer was sent and returns positive, so follow-up CTA chest obtained to rule out PE, and returns with evidence of multifocal PE with right heart strain.  Due to the right heart strain as well as breakthrough PE despite her anticoagulation, we will discussed the case with hospitalist medicine for admission.   Clinical Course as of 01/20/21 1541  Mon Jan 20, 2021  1332 Educated patient of positive D-dimer and need for CTA chest.  She is agreeable. [DS]  [id number] Call from rads, Dr. Clovis Riley,  [DS]  [phone] Educated patient of CT read with multifocal acute PE with some signs of right heart strain.  We discussed my recommendation for medical admission.  She is agreeable. [DS]  38 I speak with Dr. Blaine Hamper who accepts patient [DS]    Clinical Course User Index [DS] Vladimir Crofts, MD    ____________________________________________   FINAL CLINICAL IMPRESSION(S) / ED DIAGNOSES  Final diagnoses:  Other acute pulmonary embolism, unspecified whether acute cor pulmonale present ([place])  Shortness of breath     ED Discharge Orders    None       Dylan Smith   Note:  This document was prepared using Dragon voice recognition software and may include unintentional dictation errors.   Vladimir Crofts, MD 01/20/21 7989    Vladimir Crofts, MD 01/20/21  [phone]

## 2021-01-20 NOTE — ED Notes (Signed)
This nurse just contacted Siri Cole RN via secure chat about report

## 2021-01-20 NOTE — ED Notes (Signed)
Pt back from CT

## 2021-01-20 NOTE — Assessment & Plan Note (Signed)
Progressively worsening over the past week with exertion and hypoxia, refractory to bronchodilator. Certainly concern for PE given h/o prior DVT and hypercoagulable state though no missed doses of anticoagulation reported. No fever, pain, or other URI symptoms to suggest PNA, COVID. Recently ended menstrual cycle and not on iron supplements currently, anemia may be contributing. No chest pain and no other cardiac risk factors, ACS unlikely. Given concern for potential acute PE, recommended presentation to ED for likely CTA and further workup. Patient verbalized understanding.

## 2021-01-20 NOTE — ED Triage Notes (Signed)
Pt in w/sob, worsened x past week. States hx of DVT's, and she has been tachycardic at work, especially w/exertion. No cp, virtual doc visit told referred her to ED today.

## 2021-01-20 NOTE — ED Notes (Signed)
ED Provider at bedside. 

## 2021-01-21 ENCOUNTER — Encounter
Admission: EM | Disposition: A | Discharge status: Home / Self Care | Payer: Self-pay | Source: Ambulatory Visit | Attending: Student

## 2021-01-21 ENCOUNTER — Other Ambulatory Visit (INDEPENDENT_AMBULATORY_CARE_PROVIDER_SITE_OTHER): Payer: Self-pay | Admitting: Vascular Surgery

## 2021-01-21 DIAGNOSIS — Z86718 Personal history of other venous thrombosis and embolism: Secondary | ICD-10-CM | POA: Diagnosis not present

## 2021-01-21 DIAGNOSIS — R0902 Hypoxemia: Secondary | ICD-10-CM | POA: Diagnosis present

## 2021-01-21 DIAGNOSIS — I2699 Other pulmonary embolism without acute cor pulmonale: Principal | ICD-10-CM

## 2021-01-21 DIAGNOSIS — E669 Obesity, unspecified: Secondary | ICD-10-CM | POA: Diagnosis present

## 2021-01-21 DIAGNOSIS — I82492 Acute embolism and thrombosis of other specified deep vein of left lower extremity: Secondary | ICD-10-CM | POA: Diagnosis not present

## 2021-01-21 DIAGNOSIS — E282 Polycystic ovarian syndrome: Secondary | ICD-10-CM | POA: Diagnosis present

## 2021-01-21 DIAGNOSIS — T45516A Underdosing of anticoagulants, initial encounter: Secondary | ICD-10-CM | POA: Diagnosis present

## 2021-01-21 DIAGNOSIS — I2602 Saddle embolus of pulmonary artery with acute cor pulmonale: Secondary | ICD-10-CM

## 2021-01-21 DIAGNOSIS — J4520 Mild intermittent asthma, uncomplicated: Secondary | ICD-10-CM | POA: Diagnosis present

## 2021-01-21 DIAGNOSIS — I82452 Acute embolism and thrombosis of left peroneal vein: Secondary | ICD-10-CM | POA: Diagnosis present

## 2021-01-21 DIAGNOSIS — R0602 Shortness of breath: Secondary | ICD-10-CM | POA: Diagnosis not present

## 2021-01-21 DIAGNOSIS — Z8349 Family history of other endocrine, nutritional and metabolic diseases: Secondary | ICD-10-CM | POA: Diagnosis not present

## 2021-01-21 DIAGNOSIS — D6851 Activated protein C resistance: Secondary | ICD-10-CM

## 2021-01-21 DIAGNOSIS — Z20822 Contact with and (suspected) exposure to covid-19: Secondary | ICD-10-CM | POA: Diagnosis present

## 2021-01-21 DIAGNOSIS — Z91138 Patient's unintentional underdosing of medication regimen for other reason: Secondary | ICD-10-CM | POA: Diagnosis not present

## 2021-01-21 DIAGNOSIS — Z825 Family history of asthma and other chronic lower respiratory diseases: Secondary | ICD-10-CM | POA: Diagnosis not present

## 2021-01-21 DIAGNOSIS — E538 Deficiency of other specified B group vitamins: Secondary | ICD-10-CM | POA: Diagnosis present

## 2021-01-21 DIAGNOSIS — Z9049 Acquired absence of other specified parts of digestive tract: Secondary | ICD-10-CM | POA: Diagnosis not present

## 2021-01-21 DIAGNOSIS — Z803 Family history of malignant neoplasm of breast: Secondary | ICD-10-CM | POA: Diagnosis not present

## 2021-01-21 DIAGNOSIS — Z6841 Body Mass Index (BMI) 40.0 and over, adult: Secondary | ICD-10-CM | POA: Diagnosis not present

## 2021-01-21 DIAGNOSIS — Z8249 Family history of ischemic heart disease and other diseases of the circulatory system: Secondary | ICD-10-CM | POA: Diagnosis not present

## 2021-01-21 DIAGNOSIS — I82432 Acute embolism and thrombosis of left popliteal vein: Secondary | ICD-10-CM | POA: Diagnosis present

## 2021-01-21 DIAGNOSIS — I2609 Other pulmonary embolism with acute cor pulmonale: Secondary | ICD-10-CM | POA: Diagnosis not present

## 2021-01-21 DIAGNOSIS — D500 Iron deficiency anemia secondary to blood loss (chronic): Secondary | ICD-10-CM | POA: Diagnosis present

## 2021-01-21 DIAGNOSIS — I82442 Acute embolism and thrombosis of left tibial vein: Secondary | ICD-10-CM | POA: Diagnosis present

## 2021-01-21 DIAGNOSIS — I248 Other forms of acute ischemic heart disease: Secondary | ICD-10-CM | POA: Diagnosis present

## 2021-01-21 DIAGNOSIS — Z833 Family history of diabetes mellitus: Secondary | ICD-10-CM | POA: Diagnosis not present

## 2021-01-21 HISTORY — PX: PULMONARY THROMBECTOMY: CATH118295

## 2021-01-21 LAB — HEPARIN LEVEL (UNFRACTIONATED): Heparin Unfractionated: 0.47 IU/mL (ref 0.30–0.70)

## 2021-01-21 LAB — BASIC METABOLIC PANEL
Anion gap: 8 (ref 5–15)
BUN: 13 mg/dL (ref 6–20)
CO2: 23 mmol/L (ref 22–32)
Calcium: 9.1 mg/dL (ref 8.9–10.3)
Chloride: 106 mmol/L (ref 98–111)
Creatinine, Ser: 0.64 mg/dL (ref 0.44–1.00)
GFR, Estimated: >60 mL/min (ref >60)
Glucose, Bld: 103 mg/dL — ABNORMAL HIGH (ref 70–99)
Potassium: 3.7 mmol/L (ref 3.5–5.1)
Sodium: 137 mmol/L (ref 135–145)

## 2021-01-21 LAB — ECHOCARDIOGRAM COMPLETE
AR max vel: 2.63 cm2
AV Peak grad: 3.5 mmHg
Ao pk vel: 0.94 m/s
Area-P 1/2: 5.13 cm2
Height: 68 in
S' Lateral: 3.5 cm
Weight: 4960 oz

## 2021-01-21 LAB — CBC
HCT: 37.5 % (ref 36.0–46.0)
Hemoglobin: 11.7 g/dL — ABNORMAL LOW (ref 12.0–15.0)
MCH: 25.8 pg — ABNORMAL LOW (ref 26.0–34.0)
MCHC: 31.2 g/dL (ref 30.0–36.0)
MCV: 82.8 fL (ref 80.0–100.0)
Platelets: 266 10*3/uL (ref 150–400)
RBC: 4.53 MIL/uL (ref 3.87–5.11)
RDW: 13.2 % (ref 11.5–15.5)
WBC: 5 10*3/uL (ref 4.0–10.5)
nRBC: 0 % (ref 0.0–0.2)

## 2021-01-21 LAB — SARS CORONAVIRUS 2 (TAT 6-24 HRS): SARS Coronavirus 2: NEGATIVE

## 2021-01-21 LAB — HIV ANTIBODY (ROUTINE TESTING W REFLEX): HIV Screen 4th Generation wRfx: NONREACTIVE

## 2021-01-21 LAB — GLUCOSE, CAPILLARY: Glucose-Capillary: 87 mg/dL (ref 70–99)

## 2021-01-21 SURGERY — PULMONARY THROMBECTOMY
Anesthesia: Moderate Sedation

## 2021-01-21 MED ORDER — MIDAZOLAM HCL 2 MG/2ML IJ SOLN
INTRAMUSCULAR | Status: DC | PRN
  Administered 2021-01-21: 1 mg via INTRAVENOUS
  Administered 2021-01-21: 2 mg via INTRAVENOUS
  Administered 2021-01-21: 1 mg via INTRAVENOUS

## 2021-01-21 MED ORDER — IODIXANOL 320 MG/ML IV SOLN
INTRAVENOUS | Status: DC | PRN
  Administered 2021-01-21: 50 mL via INTRA_ARTERIAL

## 2021-01-21 MED ORDER — MIDAZOLAM HCL 2 MG/ML PO SYRP: 8.0000 mg | ORAL_SOLUTION | Freq: Once | ORAL | Status: DC | PRN

## 2021-01-21 MED ORDER — HEPARIN (PORCINE) 25000 UT/250ML-% IV SOLN
1850.0000 [IU]/h | INTRAVENOUS | Status: DC
  Administered 2021-01-22: 23:00:00 1850 [IU]/h via INTRAVENOUS
  Administered 2021-01-22: 07:00:00 1600 [IU]/h via INTRAVENOUS
  Filled 2021-01-21 (×2): qty 250

## 2021-01-21 MED ORDER — METHYLPREDNISOLONE SODIUM SUCC 125 MG IJ SOLR: 125.0000 mg | Freq: Once | INTRAMUSCULAR | Status: DC | PRN

## 2021-01-21 MED ORDER — SODIUM CHLORIDE 0.9 % IV SOLN: INTRAVENOUS | Status: DC

## 2021-01-21 MED ORDER — ALTEPLASE 2 MG IJ SOLR
INTRAMUSCULAR | Status: AC
  Filled 2021-01-21: qty 12

## 2021-01-21 MED ORDER — HYDROMORPHONE HCL 1 MG/ML IJ SOLN: 1.0000 mg | Freq: Once | INTRAMUSCULAR | Status: DC | PRN

## 2021-01-21 MED ORDER — FENTANYL CITRATE (PF) 100 MCG/2ML IJ SOLN
INTRAMUSCULAR | Status: AC
  Filled 2021-01-21: qty 2

## 2021-01-21 MED ORDER — HEPARIN SODIUM (PORCINE) 1000 UNIT/ML IJ SOLN
INTRAMUSCULAR | Status: AC
  Filled 2021-01-21: qty 1

## 2021-01-21 MED ORDER — CEFAZOLIN SODIUM-DEXTROSE 2-4 GM/100ML-% IV SOLN
2.0000 g | Freq: Once | INTRAVENOUS | Status: DC
  Filled 2021-01-21: qty 100

## 2021-01-21 MED ORDER — FENTANYL CITRATE (PF) 100 MCG/2ML IJ SOLN
INTRAMUSCULAR | Status: DC | PRN
  Administered 2021-01-21 (×4): 50 ug via INTRAVENOUS

## 2021-01-21 MED ORDER — FAMOTIDINE 20 MG PO TABS: 40.0000 mg | ORAL_TABLET | Freq: Once | ORAL | Status: DC | PRN

## 2021-01-21 MED ORDER — DIPHENHYDRAMINE HCL 50 MG/ML IJ SOLN: 50.0000 mg | Freq: Once | INTRAMUSCULAR | Status: DC | PRN

## 2021-01-21 MED ORDER — ONDANSETRON HCL 4 MG/2ML IJ SOLN: 4.0000 mg | Freq: Four times a day (QID) | INTRAMUSCULAR | Status: DC | PRN

## 2021-01-21 MED ORDER — MIDAZOLAM HCL 5 MG/5ML IJ SOLN
INTRAMUSCULAR | Status: AC
  Filled 2021-01-21: qty 5

## 2021-01-21 SURGICAL SUPPLY — 18 items
CANISTER PENUMBRA ENGINE (MISCELLANEOUS) ×2 IMPLANT
CANNULA 5F STIFF (CANNULA) ×2 IMPLANT
CATH ANGIO 5F PIGTAIL 100CM (CATHETERS) ×2 IMPLANT
CATH BERNSTEIN 5FR 130CM (CATHETERS) ×2 IMPLANT
CATH INDIGO SEP 8 (CATHETERS) ×2 IMPLANT
CATH INFINITI JR4 5F (CATHETERS) ×2 IMPLANT
CATH LIGHTNING 8 XTORQ 115 (CATHETERS) ×2 IMPLANT
CATH NAVICROSS ANGLED 135CM (MICROCATHETER) ×2 IMPLANT
CATH SELECT BERN TIP 5F 130 (CATHETERS) ×2 IMPLANT
CATH VTK 5FR 125CM BEACON TIP (CATHETERS) ×2 IMPLANT
GLIDEWIRE ADV .035X260CM (WIRE) ×2 IMPLANT
PACK ANGIOGRAPHY (CUSTOM PROCEDURE TRAY) ×2 IMPLANT
SHEATH BRITE TIP 5FRX11 (SHEATH) ×2 IMPLANT
SHEATH DESTINIATION 65 8FR (SHEATH) ×2 IMPLANT
SHIELD X-DRAPE GOLD 12X17 (MISCELLANEOUS) ×2 IMPLANT
TUBING CONTRAST HIGH PRESS 72 (TUBING) ×2 IMPLANT
WIRE AMPLATZ SSTIFF .035X260CM (WIRE) ×2 IMPLANT
WIRE GUIDERIGHT .035X150 (WIRE) ×2 IMPLANT

## 2021-01-21 NOTE — Plan of Care (Signed)
°  Problem: Clinical Measurements: °Goal: Will remain free from infection °Outcome: Progressing °Goal: Respiratory complications will improve °Outcome: Progressing °  °

## 2021-01-21 NOTE — Consult Note (Signed)
[facility] Consult Note  MRN : [id number]  Tara Shea is a 40 y.o. (05/21/1981) female who presents with chief complaint of  Chief Complaint  Patient presents with  . Shortness of Breath  . Tachycardia   History of Present Illness: Tara Shea is a 40 y.o. female with medical history significant of factor V Leiden and history of bilateral leg DVT, PCOS, asthma, iron deficiency anemia, fibroid, migraine headache, who presents with shortness breath.  Patient states that she has history of bilateral PE, and currently she is taking Lovenox, but she states that she missed a couple of doses of Lovenox recently.  She developed shortness of breath at about 1 week ago.  Her shortness breath aggravated by exertion.  She also has tachycardia, with HR up to 150s at one time.  No chest pain, cough, fever or chills.  She reports that she had oxygen desaturation to 89% yesterday. Current oxygen saturation 97% on room air in the ED.Denies symptoms of calf pain or swelling. Patient denies nausea, vomiting, diarrhea or abdominal pain.  CTA Chest (01/20/21): Examination is positive for acute pulmonary embolus with CTevidence of right heart strain (RV/LV Ratio 1.04) consistent with at least submassive (intermediate risk) PE.   Venous Duplex (01/20/21): Evidence of deep venous thrombosis involving the left popliteal vein, posterior tibial vein, and peroneal veins.  Vascular surgery was consulted by Dr. Blaine Hamper for possible vascular intervention.  Current Facility-Administered Medications  Medication Dose Route Frequency Provider Last Rate Last Admin  . fentaNYL (SUBLIMAZE) 100 MCG/2ML injection           . heparin sodium (porcine) 1000 UNIT/ML injection           . midazolam (VERSED) 5 MG/5ML injection           . 0.9 %  sodium chloride infusion   Intravenous Continuous Stegmayer, Kimberly A, PA-C 75 mL/hr at 01/21/21 1655 New Bag at 01/21/21 1655  . [MAR  Hold] acetaminophen (TYLENOL) tablet 650 mg  650 mg Oral Q6H PRN Ivor Costa, MD      . Doug Sou Hold] albuterol (VENTOLIN HFA) 108 (90 Base) MCG/ACT inhaler 2 puff  2 puff Inhalation Q4H PRN Ivor Costa, MD      . Doug Sou Hold] ascorbic acid (VITAMIN C) tablet 500 mg  500 mg Oral Daily Ivor Costa, MD   500 mg at 01/21/21 2694  . [MAR Hold] budesonide (PULMICORT) nebulizer solution 0.25 mg  0.25 mg Nebulization Daily Ivor Costa, MD   0.25 mg at 01/21/21 0854  . [START ON 01/22/2021] ceFAZolin (ANCEF) IVPB 2g/100 mL premix  2 g Intravenous Once Stegmayer, Kimberly A, PA-C      . [MAR Hold] dextromethorphan-guaiFENesin (MUCINEX DM) 30-600 MG per 12 hr tablet 1 tablet  1 tablet Oral BID PRN Ivor Costa, MD      . diphenhydrAMINE (BENADRYL) injection 50 mg  50 mg Intravenous Once PRN Stegmayer, Kimberly A, PA-C      . famotidine (PEPCID) tablet 40 mg  40 mg Oral Once PRN Stegmayer, Janalyn Harder, PA-C      . [MAR Hold] ferrous sulfate tablet 325 mg  325 mg Oral Daily Ivor Costa, MD   325 mg at 01/21/21 8546  . heparin ADULT infusion 100 units/mL (25000 units/298mL)  1,600 Units/hr Intravenous Continuous Lorna Dibble, [facility]   Stopped at 01/21/21 1656  . [MAR Hold] HYDROmorphone (DILAUDID) injection 1 mg  1 mg Intravenous Once PRN Stegmayer, Janalyn Harder, PA-C      .  methylPREDNISolone sodium succinate (SOLU-MEDROL) 125 mg/2 mL injection 125 mg  125 mg Intravenous Once PRN Stegmayer, Kimberly A, PA-C      . midazolam (VERSED) 2 MG/ML syrup 8 mg  8 mg Oral Once PRN Stegmayer, Janalyn Harder, PA-C      . [MAR Hold] multivitamin with minerals tablet 1 tablet  1 tablet Oral Daily Ivor Costa, MD   1 tablet at 01/21/21 [phone]  . [MAR Hold] ondansetron (ZOFRAN) injection 4 mg  4 mg Intravenous Q6H PRN Stegmayer, Janalyn Harder, PA-C      . [MAR Hold] promethazine (PHENERGAN) tablet 25 mg  25 mg Oral Q8H PRN Ivor Costa, MD      . Doug Sou Hold] vitamin B-12 (CYANOCOBALAMIN) tablet 1,000 mcg  1,000 mcg Oral Daily Ivor Costa, MD   1,000 mcg at  01/21/21 3267   Past Medical History:  Diagnosis Date  . Asthma   . Bone spur   . Cholelithiasis 07/26/2019  . Complication of anesthesia    pt had hypotensive episode and has had n & v  . DVT (deep venous thrombosis) ([place]) 03/2019   x 1 and DEC 2011  . Factor 5 Leiden mutation, heterozygous ([place])    stopped Xarelto 08/23/19  . Fibroid   . History of kidney stones   . IDA (iron deficiency anemia)    vitamin b12 and iron deficiency  . Migraines   . PCOS (polycystic ovarian syndrome)   . PCOS (polycystic ovarian syndrome)    Past Surgical History:  Procedure Laterality Date  . CHOLECYSTECTOMY N/A 07/28/2019   Procedure: LAPAROSCOPIC CHOLECYSTECTOMY;  Surgeon: Jules Husbands, MD;  Location: [facility] ORS;  Service: General;  Laterality: N/A;  . HYSTEROSCOPY WITH D & C N/A 08/31/2019   Procedure: DILATATION AND CURETTAGE /HYSTEROSCOPY/ SUBMUCOSAL MYOMECTOMY;  Surgeon: Will Bonnet, MD;  Location: [facility] ORS;  Service: Gynecology;  Laterality: N/A;  . HYSTEROSCOPY WITH D & C N/A 10/03/2019   Procedure: DILATATION AND CURETTAGE /HYSTEROSCOPY/ SUBMUCOSAL MYOMECTOMY;  Surgeon: Will Bonnet, MD;  Location: [facility] ORS;  Service: Gynecology;  Laterality: N/A;  . HYSTEROSCOPY WITH D & C N/A 10/26/2019   Procedure: DILATATION AND CURETTAGE /HYSTEROSCOPY, SUBMUCOSAL MYOMECTOMY;  Surgeon: Will Bonnet, MD;  Location: [facility] ORS;  Service: Gynecology;  Laterality: N/A;  . MYOMECTOMY    . TONSILLECTOMY    . WISDOM TOOTH EXTRACTION    . WRIST SURGERY     age 82 - rebroke it b/c it grew back wrong   Social History Social History   Tobacco Use  . Smoking status: Never Smoker  . Smokeless tobacco: Never Used  Vaping Use  . Vaping Use: Never used  Substance Use Topics  . Alcohol use: Yes    Comment: monthly  . Drug use: No   Family History Family History  Problem Relation Age of Onset  . Heart murmur Mother   . Thyroid disease Mother   . Migraines Mother   . Asthma Mother   .  Diabetes Mother        TYPE 2  . Bipolar disorder Mother   . Breast cancer Maternal Aunt 40  . Cancer Maternal Uncle        skin  . Cancer Maternal Grandmother 56       colon  . Hypertension Maternal Grandmother   . Cancer Maternal Aunt 50       colon  . Diabetes Maternal Aunt   Denies family history of peripheral artery disease, venous disease or renal  disease  Allergies  Allergen Reactions  . Covid-19 (Mrna) Vaccine Itching and Other (See Comments)    Lip swelling and tingling, throat itching, itching and redness on face, scalp and chest.    REVIEW OF SYSTEMS (Negative unless checked)  Constitutional: [] Weight loss  [] Fever  [] Chills Cardiac: [] Chest pain   [] Chest pressure   [] Palpitations   [] Shortness of breath when laying flat   [] Shortness of breath at rest   [] Shortness of breath with exertion. Vascular:  [] Pain in legs with walking   [] Pain in legs at rest   [] Pain in legs when laying flat   [] Claudication   [] Pain in feet when walking  [] Pain in feet at rest  [] Pain in feet when laying flat   [x] History of DVT   [] Phlebitis   [x] Swelling in legs   [] Varicose veins   [] Non-healing ulcers Pulmonary:   [] Uses home oxygen   [] Productive cough   [] Hemoptysis   [] Wheeze  [] COPD   [] Asthma Neurologic:  [] Dizziness  [] Blackouts   [] Seizures   [] History of stroke   [] History of TIA  [] Aphasia   [] Temporary blindness   [] Dysphagia   [] Weakness or numbness in arms   [] Weakness or numbness in legs Musculoskeletal:  [] Arthritis   [] Joint swelling   [] Joint pain   [] Low back pain Hematologic:  [] Easy bruising  [] Easy bleeding   [x] Hypercoagulable state   [] Anemic  [] Hepatitis Gastrointestinal:  [] Blood in stool   [] Vomiting blood  [] Gastroesophageal reflux/heartburn   [] Difficulty swallowing. Genitourinary:  [] Chronic kidney disease   [] Difficult urination  [] Frequent urination  [] Burning with urination   [] Blood in urine Skin:  [] Rashes   [] Ulcers   [] Wounds Psychological:  [] History of  anxiety   []  History of major depression.  Physical Examination  Vitals:   01/21/21 0854 01/21/21 1131 01/21/21 1523 01/21/21 1653  BP:  122/83 111/70 120/81  Pulse:  92 86   Resp:  18 20 16   Temp:  98 F (36.7 C) 98.1 F (36.7 C)   TempSrc:      SpO2: 97% 98% 95% 97%  Weight:      Height:       Body mass index is 47.13 kg/m. Gen:  WD/WN, NAD Head: [place]/AT, No temporalis wasting. Prominent temp pulse not noted. Ear/Nose/Throat: Hearing grossly intact, nares w/o erythema or drainage, oropharynx w/o Erythema/Exudate Eyes: Sclera non-icteric, conjunctiva clear Neck: Trachea midline.  No JVD.  Pulmonary:  Good air movement, respirations not labored, equal bilaterally.  Cardiac: RRR, normal S1, S2. Vascular:  Vessel Right Left  Radial Palpable Palpable  Ulnar Palpable Palpable  Brachial Palpable Palpable  Carotid Palpable, without bruit Palpable, without bruit  Aorta Not palpable N/A  Femoral Palpable Palpable  Popliteal Palpable Palpable  PT Palpable Palpable  DP Palpable Palpable   Left lower extremity: Thigh soft.  Calf soft.  Mild edema.  Extremities warm distally toes.  There is no acute vascular compromise noted at this time.  Motor/sensory is intact.  Gastrointestinal: soft, non-tender/non-distended. No guarding/reflex.  Musculoskeletal: M/S 5/5 throughout.  Extremities without ischemic changes.  No deformity or atrophy. No edema. Neurologic: Sensation grossly intact in extremities.  Symmetrical.  Speech is fluent. Motor exam as listed above. Psychiatric: Judgment intact, Mood & affect appropriate for pt's clinical situation. Dermatologic: No rashes or ulcers noted.  No cellulitis or open wounds. Lymph : No Cervical, Axillary, or Inguinal lymphadenopathy.  CBC Lab Results  Component Value Date   WBC 5.0 01/21/2021   HGB 11.7 (L)  01/21/2021   HCT 37.5 01/21/2021   MCV 82.8 01/21/2021   PLT 266 01/21/2021   BMET    Component Value Date/Time   NA 137 01/21/2021  0315   NA 140 09/05/2019 1432   NA 138 09/14/2013 2027   K 3.7 01/21/2021 0315   K 3.7 09/14/2013 2027   CL 106 01/21/2021 0315   CL 105 09/14/2013 2027   CO2 23 01/21/2021 0315   CO2 25 09/14/2013 2027   GLUCOSE 103 (H) 01/21/2021 0315   GLUCOSE 106 (H) 09/14/2013 2027   BUN 13 01/21/2021 0315   BUN 18 09/05/2019 1432   BUN 17 09/14/2013 2027   CREATININE 0.64 01/21/2021 0315   CREATININE 0.79 08/02/2018 1107   CALCIUM 9.1 01/21/2021 0315   CALCIUM 8.9 09/14/2013 2027   GFRNONAA >60 01/21/2021 0315   GFRNONAA 96 08/02/2018 1107   GFRAA 108 09/05/2019 1432   GFRAA 111 08/02/2018 1107   Estimated Creatinine Clearance: 139.6 mL/min (by C-G formula based on SCr of 0.64 mg/dL).  COAG Lab Results  Component Value Date   INR 1.0 01/20/2021   INR 1.1 07/26/2019   INR 1.3 (H) 07/26/2019   Radiology DG Chest 2 View  Result Date: 01/20/2021 CLINICAL DATA:  Dyspnea on exertion. EXAM: CHEST - 2 VIEW COMPARISON:  None. FINDINGS: The heart size and mediastinal contours are within normal limits. Both lungs are clear. The visualized skeletal structures are unremarkable. IMPRESSION: No active cardiopulmonary disease. Electronically Signed   By: Franchot Gallo M.D.   On: 01/20/2021 13:29   CT Angio Chest PE W and/or Wo Contrast  Result Date: 01/20/2021 CLINICAL DATA:  Evaluate for acute pulmonary embolus.  Low EXAM: CT ANGIOGRAPHY CHEST WITH CONTRAST TECHNIQUE: Multidetector CT imaging of the chest was performed using the standard protocol during bolus administration of intravenous contrast. Multiplanar CT image reconstructions and MIPs were obtained to evaluate the vascular anatomy. CONTRAST:  47mL OMNIPAQUE IOHEXOL 350 MG/ML SOLN COMPARISON:  Chest x-ray from earlier today FINDINGS: Cardiovascular: Examination is positive for acute pulmonary embolus. Large bilateral filling defects are noted within the distal right mainstem artery as well as the right lower lobar and segmental pulmonary  arteries clot is also identified within the left lower lobe are and left lower segmental and subsegmental pulmonary arteries. Positive for acute PE with CTevidence of right heart strain (RV/LV Ratio = 1.04) consistent with at least submassive (intermediate risk) PE. The presence of right heart strain has been associated with an increased risk of morbidity and mortality. No pericardial effusion identified. Mediastinum/Nodes: No enlarged mediastinal, hilar, or axillary lymph nodes. Thyroid gland, trachea, and esophagus demonstrate no significant findings. Lungs/Pleura: Lungs are clear. No pleural effusion or pneumothorax. Upper Abdomen: No acute abnormality.  Previous cholecystectomy. Musculoskeletal: No chest wall abnormality. No acute or significant osseous findings. Review of the MIP images confirms the above findings. IMPRESSION: Examination is positive for acute pulmonary embolus with CTevidence of right heart strain (RV/LV Ratio 1.04) consistent with at least submassive (intermediate risk) PE. The presence of right heart strain has been associated with an increased risk of morbidity and mortality. Critical Value/emergent results were called by telephone at the time of interpretation on 01/20/2021 at 2:05 pm to provider [facility] , who verbally acknowledged these results. Electronically Signed   By: Kerby Moors M.D.   On: 01/20/2021 14:06   US Venous Img Lower Bilateral (DVT)  Result Date: 01/20/2021 CLINICAL DATA:  Acute PE, shortness of breath. EXAM: BILATERAL LOWER EXTREMITY VENOUS DOPPLER  ULTRASOUND TECHNIQUE: Gray-scale sonography with graded compression, as well as color Doppler and duplex ultrasound were performed to evaluate the lower extremity deep venous systems from the level of the common femoral vein and including the common femoral, femoral, profunda femoral, popliteal and calf veins including the posterior tibial, peroneal and gastrocnemius veins when visible. The superficial great  saphenous vein was also interrogated. Spectral Doppler was utilized to evaluate flow at rest and with distal augmentation maneuvers in the common femoral, femoral and popliteal veins. COMPARISON:  November 15, 2019. FINDINGS: RIGHT LOWER EXTREMITY Common Femoral Vein: No evidence of thrombus. Normal compressibility, respiratory phasicity and response to augmentation. Saphenofemoral Junction: No evidence of thrombus. Normal compressibility and flow on color Doppler imaging. Profunda Femoral Vein: No evidence of thrombus. Normal compressibility and flow on color Doppler imaging. Femoral Vein: No evidence of thrombus. Normal compressibility, respiratory phasicity and response to augmentation. Popliteal Vein: No evidence of thrombus. Normal compressibility, respiratory phasicity and response to augmentation. Calf Veins: No evidence of thrombus. Normal compressibility and flow on color Doppler imaging. Venous Reflux:  None. LEFT LOWER EXTREMITY Common Femoral Vein: No evidence of thrombus. Normal compressibility, respiratory phasicity and response to augmentation. Saphenofemoral Junction: No evidence of thrombus. Normal compressibility and flow on color Doppler imaging. Profunda Femoral Vein: No evidence of thrombus. Normal compressibility and flow on color Doppler imaging. Femoral Vein: No evidence of thrombus. Normal compressibility, respiratory phasicity and response to augmentation. Popliteal Vein: Nonocclusive thrombus.  Diminished compressibility. Calf Veins: Occlusive thrombus involving the posterior tibial vein and peroneal vein. Diminished compressibility. Venous Reflux:  None. IMPRESSION: Evidence of deep venous thrombosis involving the left popliteal vein, posterior tibial vein, and peroneal veins. These results will be called to the ordering clinician or representative by the Radiologist Assistant, and communication documented in the PACS or [employer]. Electronically Signed   By: Margaretha Sheffield MD    On: 01/20/2021 17:09   ECHOCARDIOGRAM COMPLETE  Result Date: 01/21/2021    ECHOCARDIOGRAM REPORT   Patient Name:   Tara Shea Date of Exam: 01/20/2021 Medical Rec #:  [id number]            Height:       68.0 in Accession #:    [id number]           Weight:       310.0 lb Date of Birth:  01-15-1981           BSA:          2.462 m Patient Age:    44 years             BP:           126/89 mmHg Patient Gender: F                    HR:           74 bpm. Exam Location:  [facility] Procedure: 2D Echo, Cardiac Doppler and Color Doppler Indications:     Pulmonary Embolus I26.09  History:         Patient has no prior history of Echocardiogram examinations.  Sonographer:     Alyse Low Roar Referring Phys:  Unknown Foley NIU Diagnosing Phys: Nelva Bush MD IMPRESSIONS  1. Left ventricular ejection fraction, by estimation, is 55 to 60%. The left ventricle has normal function. Left ventricular endocardial border not optimally defined to evaluate regional wall motion. Left ventricular diastolic parameters were normal.  2. Pulmonary artery pressure is at least upper  normal to mildly elevated (PASP 30 mmHg plus central venous pressure). Right ventricular systolic function is normal. The right ventricular size is normal.  3. The mitral valve is normal in structure. Trivial mitral valve regurgitation. No evidence of mitral stenosis.  4. The aortic valve was not well visualized. Aortic valve regurgitation is not visualized. No aortic stenosis is present. FINDINGS  Left Ventricle: Left ventricular ejection fraction, by estimation, is 55 to 60%. The left ventricle has normal function. Left ventricular endocardial border not optimally defined to evaluate regional wall motion. The left ventricular internal cavity size was normal in size. There is borderline left ventricular hypertrophy. Left ventricular diastolic parameters were normal. Right Ventricle: Pulmonary artery pressure is at least upper normal to mildly elevated (PASP 30 mmHg  plus central venous pressure). The right ventricular size is normal. No increase in right ventricular wall thickness. Right ventricular systolic function  is normal. Left Atrium: Left atrial size was normal in size. Right Atrium: Right atrial size was normal in size. Pericardium: Trivial pericardial effusion is present. Mitral Valve: The mitral valve is normal in structure. Trivial mitral valve regurgitation. No evidence of mitral valve stenosis. Tricuspid Valve: The tricuspid valve is normal in structure. Tricuspid valve regurgitation is mild. Aortic Valve: The aortic valve was not well visualized. Aortic valve regurgitation is not visualized. No aortic stenosis is present. Aortic valve peak gradient measures 3.5 mmHg. Pulmonic Valve: The pulmonic valve was grossly normal. Pulmonic valve regurgitation is trivial. No evidence of pulmonic stenosis. Aorta: The aortic root is normal in size and structure. Pulmonary Artery: The pulmonary artery is not well seen. Venous: The inferior vena cava was not well visualized. IAS/Shunts: The interatrial septum was not well visualized.  LEFT VENTRICLE PLAX 2D LVIDd:         4.80 cm  Diastology LVIDs:         3.50 cm  LV e' medial:    11.40 cm/s LV PW:         1.00 cm  LV E/e' medial:  7.4 LV IVS:        1.00 cm  LV e' lateral:   12.90 cm/s LVOT diam:     2.00 cm  LV E/e' lateral: 6.5 LVOT Area:     3.14 cm  RIGHT VENTRICLE RV Mid diam:    3.00 cm RV S prime:     14.30 cm/s TAPSE (M-mode): 1.9 cm LEFT ATRIUM             Index       RIGHT ATRIUM           Index LA diam:        3.90 cm 1.58 cm/m  RA Area:     11.10 cm LA Vol (A2C):   48.1 ml 19.54 ml/m RA Volume:   20.10 ml  8.16 ml/m LA Vol (A4C):   41.2 ml 16.73 ml/m LA Biplane Vol: 46.9 ml 19.05 ml/m  AORTIC VALVE               PULMONIC VALVE AV Area (Vmax): 2.63 cm   PV Vmax:        0.98 m/s AV Vmax:        93.70 cm/s PV Peak grad:   3.8 mmHg AV Peak Grad:   3.5 mmHg   RVOT Peak grad: 1 mmHg LVOT Vmax:      78.40 cm/s   AORTA Ao Root diam: 2.80 cm MITRAL VALVE  TRICUSPID VALVE MV Area (PHT): 5.13 cm    TR Peak grad:   29.8 mmHg MV Decel Time: 148 msec    TR Vmax:        273.00 cm/s MV E velocity: 84.40 cm/s MV A velocity: 56.60 cm/s  SHUNTS MV E/A ratio:  1.49        Systemic Diam: 2.00 cm MV A Prime:    9.1 cm/s Harrell Gave End MD Electronically signed by Nelva Bush MD Signature Date/Time: 01/21/2021/7:23:19 AM    Final    Assessment/Plan KATOYA AMATO is a 40 y.o. female with medical history significant of factor V Leiden and history of bilateral leg DVT, PCOS, asthma, iron deficiency anemia, fibroid, migraine headache, who presents with shortness breath found to have DVT/PE.  1.  Pulmonary Embolism: Patient with known history of factor V Leiden.  Since the patient was not compliant in taking Lovenox daily would not consider this a failure of anticoagulation.  Reviewed CTA images with Dr. Delana Meyer.  As per CTA of the chest dated January 20, 2019 at 1406 there was documented right heart strain.  In the setting of significant hypoxia with exertion, right heart strain documented on CTA chest, large clot burden recommend the patient undergo a pulmonary thrombectomy / thrombolysis in an attempt to lessen the clot burden, decrease stress on the heart, improvement in dyspnea on exertion.  Procedure, risks and benefits were explained to the patient by both me and Dr. Delana Meyer.  All questions were answered.  The patient and her family member at her bedside would like to proceed.  2.  DVT: Distal DVT noted on venous duplex Due to essentially unremarkable physical exam, essentially asymptomatic and distal location the patient is not a candidate for endovascular venous thrombectomy or thrombolysis.  Seen and examined with Dr. Francene Castle, PA-C  01/21/2021 5:00 PM  This note was created with Dragon medical transcription system.  Any error is purely unintentional

## 2021-01-21 NOTE — Plan of Care (Signed)
°  Problem: Clinical Measurements: Goal: Cardiovascular complication will be avoided Outcome: Adequate for Discharge   Problem: Clinical Measurements: Goal: Diagnostic test results will improve Outcome: Adequate for Discharge

## 2021-01-21 NOTE — Plan of Care (Signed)
  Problem: Clinical Measurements: Goal: Cardiovascular complication will be avoided Outcome: Adequate for Discharge   Problem: Clinical Measurements: Goal: Diagnostic test results will improve Outcome: Adequate for Discharge   Problem: Clinical Measurements: Goal: Ability to maintain clinical measurements within normal limits will improve Outcome: Adequate for Discharge

## 2021-01-21 NOTE — Progress Notes (Signed)
Tara Shea  QJJ:[id number] DOB: [birth year] DOA: 01/20/2021 PCP: [facility]    Brief Narrative:  40 year old with a history of factor V Leiden mutation, bilateral lower extremity DVTs, PCOS, asthma, uterine fibroids, and migraine headaches who presented to the ER with 1 week of shortness of breath after "missing a couple of doses of Lovenox."  In the ED she was found to have an elevated D-dimer.  CT angio of the chest was positive for bilateral submassive PEs with some concern for right heart strain.  Lower extremity venous duplex was positive for left lower extremity DVT extending through the popliteal, posterior tibial, and peroneal veins.  Significant Events:  2/28 admit w/ acute PE/DVT  Antimicrobials:  none  DVT prophylaxis: IV heparin  Subjective: Afebrile.  Vital signs stable.  Oxygen saturation 96% on room air.  Denies chest pain.  Reports severe dyspnea on exertion.  Has not noticed any pronounced lower extremity complaints.  Tells me she missed a few Lovenox doses around the time of her menstrual period.  Assessment & Plan:  Acute bilateral PE and left lower extremity DVT -factor V Leiden mutation Most likely due to missed dose of lovenox - other risk factors include obesity, use of letrozole, and of course her Factor V mutation - suggested she discontinue letrozole, which she agrees to do (no longer attempting to become pregnant now that she has a new PE) - R heart pressures increased on TTE, but no evidence of signig R heart failure - Vascular Surgery consulted by admitting MD and reportedly planning an intervention (awaiting consult note) - was previously on DOAC, but changed to lovenox as she was attempting to get pregnant - would like to transition back to [place] when safe to come off heparin as she will now delay her attempts at pregnancy until recovered from this acute event   Severe DOE May require O2 support during exertion - will need formal  walking sat screen once able to ambulate (post vascular procedure)  Asthma Well compensated at this time   PCOS  Chronic iron deficiency anemia Monitor hgb - no acute gross blood loss at this time   Obesity - Body mass index is 47.13 kg/m.   Disposition Needs assistance if able at time of d/c obtaining Hematologist at [facility] (her insurance does not cover [facility]) for outpt f/u    Code Status: FULL CODE Family Communication: Spoke with patient and husband at bedside  Consultants:  none  Objective: Blood pressure 98/63, pulse 82, temperature 98.3 F (36.8 C), temperature source Oral, resp. rate 16, height 5\' 8"  (1.727 m), weight (!) 140.6 kg, last menstrual period 01/17/2021, SpO2 100 %.  Intake/Output Summary (Last 24 hours) at 01/21/2021 0752 Last data filed at 01/20/2021 2100 Gross per 24 hour  Intake 288 ml  Output --  Net 288 ml   Filed Weights   01/20/21 1224 01/20/21 1929  Weight: (!) 140.6 kg (!) 140.6 kg    Examination: General: No acute respiratory distress Lungs: Clear to auscultation bilaterally without wheezes or crackles - breath sounds distant Cardiovascular: Regular rate and rhythm without murmur gallop or rub normal S1 and S2 - heart sounds distant  Abdomen: Nontender, obese, soft, bowel sounds positive Extremities: trace edema B LE, perhaps slightly worse on L - no cyanosis   CBC: Recent Labs  Lab 01/20/21 1248 01/21/21 0315  WBC 5.1 5.0  HGB 12.0 11.7*  HCT 38.2 37.5  MCV 82.9 82.8  PLT 275 266  Basic Metabolic Panel: Recent Labs  Lab 01/20/21 1248 01/21/21 0315  NA 136 137  K 3.5 3.7  CL 106 106  CO2 21* 23  GLUCOSE 112* 103*  BUN 16 13  CREATININE 0.75 0.64  CALCIUM 9.0 9.1   GFR: Estimated Creatinine Clearance: 139.6 mL/min (by C-G formula based on SCr of 0.64 mg/dL).  Liver Function Tests: No results for input(s): AST, ALT, ALKPHOS, BILITOT, PROT, ALBUMIN in the last 168 hours. No results for input(s): LIPASE, AMYLASE in the  last 168 hours. No results for input(s): AMMONIA in the last 168 hours.  Coagulation Profile: Recent Labs  Lab 01/20/21 1248  INR 1.0    HbA1C: Hgb A1c MFr Bld  Date/Time Value Ref Range Status  07/28/2019 07:47 AM 4.7 (L) 4.8 - 5.6 % Final    Comment:    (NOTE) Pre diabetes:          5.7%-6.4% Diabetes:              >6.4% Glycemic control for   <7.0% adults with diabetes     CBG: Recent Labs  Lab 01/21/21 0731  GLUCAP 87    Recent Results (from the past 240 hour(s))  SARS CORONAVIRUS 2 (TAT 6-24 HRS) Nasopharyngeal Nasopharyngeal Swab     Status: None   Collection Time: 01/20/21  1:36 PM   Specimen: Nasopharyngeal Swab  Result Value Ref Range Status   SARS Coronavirus 2 NEGATIVE NEGATIVE Final    Comment: (NOTE) SARS-CoV-2 target nucleic acids are NOT DETECTED.  The SARS-CoV-2 RNA is generally detectable in upper and lower respiratory specimens during the acute phase of infection. Negative results do not preclude SARS-CoV-2 infection, do not rule out co-infections with other pathogens, and should not be used as the sole basis for treatment or other patient management decisions. Negative results must be combined with clinical observations, patient history, and epidemiological information. The expected result is Negative.  Fact Sheet for Patients: SugarRoll.be  Fact Sheet for Healthcare Providers: https://www.woods-mathews.com/  This test is not yet approved or cleared by the [place] FDA and  has been authorized for detection and/or diagnosis of SARS-CoV-2 by FDA under an Emergency Use Authorization (EUA). This EUA will remain  in effect (meaning this test can be used) for the duration of the COVID-19 declaration under Se ction 564(b)(1) of the Act, 21 U.S.C. section 360bbb-3(b)(1), unless the authorization is terminated or revoked sooner.  Performed at [facility], [place] [address], [place],  [place] [address]      Scheduled Meds: . vitamin C  500 mg Oral Daily  . budesonide  0.25 mg Nebulization Daily  . ferrous sulfate  325 mg Oral Daily  . multivitamin with minerals  1 tablet Oral Daily  . vitamin B-12  1,000 mcg Oral Daily   Continuous Infusions: . heparin 1,600 Units/hr (01/21/21 0335)     LOS: 0 days   Cherene Altes, MD [facility] Office  [phone] Pager - Text Page per Amion  If 7PM-7AM, please contact night-coverage per Amion 01/21/2021, 7:52 AM

## 2021-01-21 NOTE — Op Note (Signed)
[facility]  Percutaneous Study/Intervention Procedural Note   Date of Surgery: 01/21/2021,6:22 PM  Surgeon: Hortencia Pilar  Pre-operative Diagnosis: Symptomatic bilateral pulmonary emboli  Post-operative diagnosis:  Same  Procedure(s) Performed:  1.  Contrast injection right heart  2.  Thrombolysis bilateral pulmonary arteries  3.  Mechanical thrombectomy bilateral pulmonary arteries  4.  Selective catheter placement right upper middle and lower lobe pulmonary arteries  5.  Selective catheter placement left upper and lower lobe pulmonary arteries    Anesthesia: Conscious sedation was administered under my direct supervision by the interventional radiology RN. IV Versed plus fentanyl were utilized. Continuous ECG, pulse oximetry and blood pressure was monitored throughout the entire procedure.  Versed and fentanyl were administered intravenously.  Conscious sedation was administered for a total of 71 minutes and 16 seconds.  EBL: 100 cc  Sheath: 8 [place] destination 65 cm in length right common femoral vein antegrade  Contrast: 50 cc   Fluoroscopy Time: 23.7 minutes  Indications:  Patient presents with pulmonary emboli. The patient is symptomatic with hypoxemia initially and persistent dyspnea and tachycardia on exertion.  There was evidence of right heart strain on the initial CT angiogram. The patient is otherwise a good candidate for intervention and even the long-term benefits pulmonary angiography with thrombolysis is offered. The risks and benefits are reviewed long-term benefits are discussed. All questions are answered patient agrees to proceed.  Procedure:  Tara Beahm Ellingtonis a 40 y.o. female who was identified and appropriate procedural time out was performed.  The patient was then placed supine on the table and prepped and draped in the usual sterile fashion.  Ultrasound was used to evaluate the right common femoral vein.  It was patent, as it  was echolucent and compressible.  A digital ultrasound image was acquired for the permanent record.  A Seldinger needle was used to access the right common femoral vein under direct ultrasound guidance.  A 0.035 J wire was advanced without resistance and a 5Fr sheath was placed and then upsized to an 8 [place] 65 cm destination sheath.    The wire and pigtail catheter were then negotiated into the right atrium and bolus injection of contrast was utilized to demonstrate the right ventricle and the pulmonary artery outflow. The wire and catheter were then negotiated into the main pulmonary artery where hand injection of contrast was utilized to demonstrate the pulmonary arteries and confirm the locations of the pulmonary emboli.  TPA was reconstituted and delivered onto the table. A total of 12 milligrams of TPA was utilized.  6 mg was administered on the left side and 6 mg was administered on the right side. This was then allowed to dwell.  The Penumbra Cat 8 catheter was then advanced up into the pulmonary vasculature. The left lung was addressed first. Catheter was negotiated into the left lower lobe and mechanical thrombectomy was performed. Follow-up imaging demonstrated a good result and therefore the catheter was renegotiated into the left upper lobe pulmonary artery and again mechanical thrombectomy was performed. Passes were made with both the Penumbra catheter itself as well as introducing the separator. Follow-up imaging was then performed from the main pulmonary trunk.  Images demonstrated recanalization of the upper and lower lobes with dramatic improvement in the vasculature opacified with contrast.  The Penumbra Cat 8 catheter was then negotiated to the opposite side. The right lung was then addressed. Catheter was negotiated into the right middle lobe and mechanical thrombectomy was performed. Follow-up imaging  demonstrated a good result and therefore the catheter was renegotiated into the right  lower lobe pulmonary artery and again mechanical thrombectomy was performed.  The catheter was renegotiated into the right upper lobe pulmonary artery and again mechanical thrombectomy was performed.  Again passes were made with both the Penumbra catheter itself as well as introducing the separator. Follow-up imaging was then performed.  After review these images wires were reintroduced and the catheters removed. Then, the sheath is then pulled and pressures held. A safeguard is placed.    Findings:   Right heart imaging:  Right atrium and right ventricle and the pulmonary outflow tract appears normal no evidence of outflow obstruction  Right lung: Initial imaging demonstrates near total occlusion of the right lower lobe and very poor filling of the right middle lobe consistent with thrombus in the distal main pulmonary artery.  Also noted is thrombus within the right upper lobe pulmonary artery.  Following thrombectomy utilizing 6 mg of TPA plus the penumbra CAT 8 there is now near complete resolution of the thrombus on the right side.  There is a small bit of residual thrombus in the right upper lobe.  Left lung: Initial imaging demonstrates poor flow into the upper lobe and near total opacification of the left lower lobe.  Following mechanical thrombectomy with the addition of 6 mg of TPA there is now reestablishment of flow with complete filling of the lower lobe as well as the upper lobe.    Disposition: Patient was taken to the recovery room in stable condition having tolerated the procedure well.  [name] 01/21/2021,6:22 PM

## 2021-01-21 NOTE — Consult Note (Signed)
ANTICOAGULATION CONSULT NOTE - Follow Up Consult  Pharmacy Consult for heparin Indication: pulmonary embolus w/ RH strain  Allergies  Allergen Reactions  . Covid-19 (Mrna) Vaccine Itching and Other (See Comments)    Lip swelling and tingling, throat itching, itching and redness on face, scalp and chest.     Patient Measurements: Height: 5\' 8"  (172.7 cm) Weight: (!) 140.6 kg (309 lb 15.5 oz) IBW/kg (Calculated) : 63.9 Heparin Dosing Weight: 98.1 kg  Vital Signs: Temp: 98.3 F (36.8 C) (03/01 0430) Temp Source: Oral (03/01 0430) BP: 98/63 (03/01 0430) Pulse Rate: 82 (03/01 0430)  Labs: Recent Labs    01/20/21 1248 01/20/21 2107 01/21/21 0315  HGB 12.0  --  11.7*  HCT 38.2  --  37.5  PLT 275  --  266  APTT 32  --   --   LABPROT 12.6  --   --   INR 1.0  --   --   HEPARINUNFRC  --  0.60 0.47  CREATININE 0.75  --  0.64    Estimated Creatinine Clearance: 139.6 mL/min (by C-G formula based on SCr of 0.64 mg/dL).   Medications:  PTA: lovenox 120mg  q12h (last dose 2/27 ~2100) Heparin Dosing Weight: 98.1 kg  Assessment: 40yo Female with history of factor V Leiden (heterozygous - lovenox PTA), history of DVT, obesity & PCOS presenting with SOB and tachycardia (HR 150s). Pharmacy consulted for the management of heparin gtt ISO PE with right heart strain.  Baseline:  H/H/Plts WNL;  INR 1.0; aPTT 32s; D-Dimer 2.86  Date Time HL Rate/Comment 2/28 2107 0.6 1600 units/hr 3/01     0315    0.47     1600 units/hr   Goal of Therapy:  Heparin level 0.3-0.7 units/ml Monitor platelets by anticoagulation protocol: Yes   Plan:  3/1:  HL @ 0315 = 0.47 , therapeutic X 2  Will recheck HL on 3/2 with AM labs.    Kaela Beitz D, PharmD 01/21/2021,4:43 AM

## 2021-01-22 ENCOUNTER — Encounter: Payer: Self-pay | Admitting: Vascular Surgery

## 2021-01-22 DIAGNOSIS — I2602 Saddle embolus of pulmonary artery with acute cor pulmonale: Secondary | ICD-10-CM

## 2021-01-22 DIAGNOSIS — R079 Chest pain, unspecified: Secondary | ICD-10-CM

## 2021-01-22 DIAGNOSIS — E282 Polycystic ovarian syndrome: Secondary | ICD-10-CM

## 2021-01-22 DIAGNOSIS — R0600 Dyspnea, unspecified: Secondary | ICD-10-CM

## 2021-01-22 LAB — HEPARIN LEVEL (UNFRACTIONATED)
Heparin Unfractionated: 0.25 IU/mL — ABNORMAL LOW (ref 0.30–0.70)
Heparin Unfractionated: 0.26 IU/mL — ABNORMAL LOW (ref 0.30–0.70)
Heparin Unfractionated: 0.44 IU/mL (ref 0.30–0.70)

## 2021-01-22 LAB — TROPONIN I (HIGH SENSITIVITY)
Troponin I (High Sensitivity): 48 ng/L — ABNORMAL HIGH (ref <18)
Troponin I (High Sensitivity): 50 ng/L — ABNORMAL HIGH (ref <18)

## 2021-01-22 LAB — COMPREHENSIVE METABOLIC PANEL
ALT: 11 U/L (ref 0–44)
AST: 13 U/L — ABNORMAL LOW (ref 15–41)
Albumin: 3.6 g/dL (ref 3.5–5.0)
Alkaline Phosphatase: 56 U/L (ref 38–126)
Anion gap: 10 (ref 5–15)
BUN: 14 mg/dL (ref 6–20)
CO2: 23 mmol/L (ref 22–32)
Calcium: 8.9 mg/dL (ref 8.9–10.3)
Chloride: 106 mmol/L (ref 98–111)
Creatinine, Ser: 0.67 mg/dL (ref 0.44–1.00)
GFR, Estimated: >60 mL/min (ref >60)
Glucose, Bld: 102 mg/dL — ABNORMAL HIGH (ref 70–99)
Potassium: 3.8 mmol/L (ref 3.5–5.1)
Sodium: 139 mmol/L (ref 135–145)
Total Bilirubin: 0.5 mg/dL (ref 0.3–1.2)
Total Protein: 6.5 g/dL (ref 6.5–8.1)

## 2021-01-22 LAB — CBC
HCT: 35.4 % — ABNORMAL LOW (ref 36.0–46.0)
Hemoglobin: 11.2 g/dL — ABNORMAL LOW (ref 12.0–15.0)
MCH: 26.5 pg (ref 26.0–34.0)
MCHC: 31.6 g/dL (ref 30.0–36.0)
MCV: 83.7 fL (ref 80.0–100.0)
Platelets: 227 10*3/uL (ref 150–400)
RBC: 4.23 MIL/uL (ref 3.87–5.11)
RDW: 13.5 % (ref 11.5–15.5)
WBC: 5.8 10*3/uL (ref 4.0–10.5)
nRBC: 0 % (ref 0.0–0.2)

## 2021-01-22 MED ORDER — HEPARIN BOLUS VIA INFUSION
2000.0000 [IU] | Freq: Once | INTRAVENOUS | Status: AC
  Administered 2021-01-22: 15:00:00 2000 [IU] via INTRAVENOUS
  Filled 2021-01-22: qty 2000

## 2021-01-22 MED ORDER — HEPARIN BOLUS VIA INFUSION
2000.0000 [IU] | Freq: Once | INTRAVENOUS | Status: AC
  Administered 2021-01-22: 2000 [IU] via INTRAVENOUS
  Filled 2021-01-22: qty 2000

## 2021-01-22 MED ORDER — RIVAROXABAN 20 MG PO TABS: 20.0000 mg | ORAL_TABLET | Freq: Every day | ORAL | 11 refills | Status: DC

## 2021-01-22 NOTE — Progress Notes (Signed)
PROGRESS NOTE  Tara Shea FTD:[id number] DOB: [birth year]   PCP: [facility]  Patient is from: Home  DOA: 01/20/2021 LOS: 1  Chief complaints: Shortness of breath  Brief Narrative / Interim history: 40 year old with a history of factor V Leiden mutation, bilateral lower extremity DVTs, PCOS, asthma, uterine fibroids, and migraine headaches who presented to the ER with 1 week of shortness of breath after "missing a couple of doses of Lovenox."  In the ED she was found to have an elevated D-dimer.  CT angio of the chest was positive for bilateral submassive PEs with some concern for right heart strain.  Lower extremity venous duplex was positive for left lower extremity DVT extending through the popliteal, posterior tibial, and peroneal veins.  She underwent thrombolysis and thrombectomy for PE on 01/21/2021.  Subjective: Seen and examined earlier this morning.  No major events overnight of this morning. Reports central chest pain, dyspnea and tachycardia with exertion.  Denies GI or UTI symptoms.  Objective: Vitals:   01/22/21 0350 01/22/21 0737 01/22/21 0755 01/22/21 1121  BP: 95/65 108/72  120/85  Pulse: 83 81  90  Resp: 18 18  16   Temp: 97.9 F (36.6 C) 97.9 F (36.6 C)  97.6 F (36.4 C)  TempSrc:  Oral    SpO2: 96% 97% 97% 97%  Weight:      Height:        Intake/Output Summary (Last 24 hours) at 01/22/2021 1314 Last data filed at 01/22/2021 0900 Gross per 24 hour  Intake 246.25 ml  Output --  Net 246.25 ml   Filed Weights   01/20/21 1224 01/20/21 1929  Weight: (!) 140.6 kg (!) 140.6 kg    Examination:  GENERAL: No apparent distress.  Nontoxic. HEENT: MMM.  Vision and hearing grossly intact.  NECK: Supple.  No apparent JVD.  RESP: On RA.  No IWOB.  Fair aeration bilaterally. CVS:  RRR. Heart sounds normal.  ABD/GI/GU: BS+. Abd soft, NTND.  MSK/EXT:  Moves extremities. No apparent deformity. No edema.  SKIN: no apparent skin lesion or  wound NEURO: Awake, alert and oriented appropriately.  No apparent focal neuro deficit. PSYCH: Calm. Normal affect.  Procedures:  01/21/2021-thrombolysis/thrombectomy of PE  Microbiology summarized: COVID-19 PCR nonreactive.  Assessment & Plan: Acute bilateral PE/LLE DVT in patient with history of factor V Leyden mutation-likely due to missed dose of Lovenox, obesity, use of letrozole and factor V mutation.  -R heart pressures increased on TTE, but no evidence of signig R heart failure -S/p thrombectomy/thrombolysis of PE by vascular surgery on 01/21/2021. -Will discontinue Lovenox and letrozole. No further plan for pregnancy.  Xarelto on discharge -Ambulatory referral to [facility] hematologist-insurance preference  Severe DOE/exertional chest pain-likely due to the above -Check troponin and EKG -Incentive spirometry, OOB/PT/OT  Asthma: Stable -Albuterol inhaler as needed  Chronic iron deficiency anemia: H&H stable. Recent Labs    03/06/20 1132 01/20/21 1248 01/21/21 0315 01/22/21 0548  HGB 9.5* 12.0 11.7* 11.2*  -Monitor   Morbid obesity/PCOS Body mass index is 47.13 kg/m.  -Encourage lifestyle change to lose weight -Could benefit from Metformin       DVT prophylaxis:  On heparin for anticoagulation  Code Status: Full code Family Communication: Patient and/or RN. Available if any question.  Level of care: Med-Surg Status is: Inpatient  Remains inpatient appropriate because:Hemodynamically unstable, IV treatments appropriate due to intensity of illness or inability to take PO and Inpatient level of care appropriate due to severity of illness  Dispo: The patient is from: Home              Anticipated d/c is to: Home              Patient currently is not medically stable to d/c.   Difficult to place patient No       Consultants:  Vascular surgery   Sch Meds:  Scheduled Meds: . vitamin C  500 mg Oral Daily  . budesonide  0.25 mg Nebulization Daily  .  ferrous sulfate  325 mg Oral Daily  . multivitamin with minerals  1 tablet Oral Daily  . vitamin B-12  1,000 mcg Oral Daily   Continuous Infusions: . heparin 1,700 Units/hr (01/22/21 0828)   PRN Meds:.acetaminophen, albuterol, dextromethorphan-guaiFENesin, HYDROmorphone (DILAUDID) injection, ondansetron (ZOFRAN) IV, promethazine  Antimicrobials: Anti-infectives (From admission, onward)   Start     Dose/Rate Route Frequency Ordered Stop   01/22/21 0000  ceFAZolin (ANCEF) IVPB 2g/100 mL premix  Status:  Discontinued       Note to Pharmacy: To be given in specials   2 g 200 mL/hr over 30 Minutes Intravenous  Once 01/21/21 1635 01/21/21 1903       I have personally reviewed the following labs and images: CBC: Recent Labs  Lab 01/20/21 1248 01/21/21 0315 01/22/21 0548  WBC 5.1 5.0 5.8  HGB 12.0 11.7* 11.2*  HCT 38.2 37.5 35.4*  MCV 82.9 82.8 83.7  PLT 275 266 227   BMP &GFR Recent Labs  Lab 01/20/21 1248 01/21/21 0315 01/22/21 0548  NA 136 137 139  K 3.5 3.7 3.8  CL 106 106 106  CO2 21* 23 23  GLUCOSE 112* 103* 102*  BUN 16 13 14   CREATININE 0.75 0.64 0.67  CALCIUM 9.0 9.1 8.9   Estimated Creatinine Clearance: 139.6 mL/min (by C-G formula based on SCr of 0.67 mg/dL). Liver & Pancreas: Recent Labs  Lab 01/22/21 0548  AST 13*  ALT 11  ALKPHOS 56  BILITOT 0.5  PROT 6.5  ALBUMIN 3.6   No results for input(s): LIPASE, AMYLASE in the last 168 hours. No results for input(s): AMMONIA in the last 168 hours. Diabetic: No results for input(s): HGBA1C in the last 72 hours. Recent Labs  Lab 01/21/21 0731  GLUCAP 87   Cardiac Enzymes: No results for input(s): CKTOTAL, CKMB, CKMBINDEX, TROPONINI in the last 168 hours. No results for input(s): PROBNP in the last 8760 hours. Coagulation Profile: Recent Labs  Lab 01/20/21 1248  INR 1.0   Thyroid Function Tests: No results for input(s): TSH, T4TOTAL, FREET4, T3FREE, THYROIDAB in the last 72 hours. Lipid  Profile: No results for input(s): CHOL, HDL, LDLCALC, TRIG, CHOLHDL, LDLDIRECT in the last 72 hours. Anemia Panel: No results for input(s): VITAMINB12, FOLATE, FERRITIN, TIBC, IRON, RETICCTPCT in the last 72 hours. Urine analysis:    Component Value Date/Time   COLORURINE AMBER (A) 01/20/2021 1226   APPEARANCEUR CLOUDY (A) 01/20/2021 1226   APPEARANCEUR Cloudy 09/14/2013 1947   LABSPEC 1.034 (H) 01/20/2021 1226   LABSPEC 1.034 09/14/2013 1947   PHURINE 5.0 01/20/2021 1226   GLUCOSEU NEGATIVE 01/20/2021 1226   GLUCOSEU Negative 09/14/2013 1947   HGBUR SMALL (A) 01/20/2021 1226   BILIRUBINUR NEGATIVE 01/20/2021 1226   BILIRUBINUR small 03/22/2019 1151   BILIRUBINUR Negative 09/14/2013 1947   KETONESUR 5 (A) 01/20/2021 1226   PROTEINUR 30 (A) 01/20/2021 1226   UROBILINOGEN negative (A) 03/22/2019 1151   NITRITE NEGATIVE 01/20/2021 [place] 01/20/2021 1226  LEUKOCYTESUR Negative 09/14/2013 1947   Sepsis Labs: Invalid input(s): PROCALCITONIN, [place]  Microbiology: Recent Results (from the past 240 hour(s))  SARS CORONAVIRUS 2 (TAT 6-24 HRS) Nasopharyngeal Nasopharyngeal Swab     Status: None   Collection Time: 01/20/21  1:36 PM   Specimen: Nasopharyngeal Swab  Result Value Ref Range Status   SARS Coronavirus 2 NEGATIVE NEGATIVE Final    Comment: (NOTE) SARS-CoV-2 target nucleic acids are NOT DETECTED.  The SARS-CoV-2 RNA is generally detectable in upper and lower respiratory specimens during the acute phase of infection. Negative results do not preclude SARS-CoV-2 infection, do not rule out co-infections with other pathogens, and should not be used as the sole basis for treatment or other patient management decisions. Negative results must be combined with clinical observations, patient history, and epidemiological information. The expected result is Negative.  Fact Sheet for Patients: SugarRoll.be  Fact Sheet for  Healthcare Providers: https://www.woods-mathews.com/  This test is not yet approved or cleared by the [place] FDA and  has been authorized for detection and/or diagnosis of SARS-CoV-2 by FDA under an Emergency Use Authorization (EUA). This EUA will remain  in effect (meaning this test can be used) for the duration of the COVID-19 declaration under Se ction 564(b)(1) of the Act, 21 U.S.C. section 360bbb-3(b)(1), unless the authorization is terminated or revoked sooner.  Performed at [facility], [place] [address] [place], [place] [address]     Radiology Studies: PERIPHERAL VASCULAR CATHETERIZATION  Result Date: 01/21/2021 See op note     Jinnie Onley T. [place]  If 7PM-7AM, please contact night-coverage [email or web address] 01/22/2021, 1:14 PM

## 2021-01-22 NOTE — Consult Note (Signed)
ANTICOAGULATION CONSULT NOTE - Follow Up Consult  Pharmacy Consult for heparin Indication: pulmonary embolus w/ RH strain  Allergies  Allergen Reactions  . Covid-19 (Mrna) Vaccine Itching and Other (See Comments)    Lip swelling and tingling, throat itching, itching and redness on face, scalp and chest.     Patient Measurements: Height: 5\' 8"  (172.7 cm) Weight: (!) 140.6 kg (309 lb 15.5 oz) IBW/kg (Calculated) : 63.9 Heparin Dosing Weight: 98.1 kg  Vital Signs: Temp: 97.6 F (36.4 C) (03/02 1121) Temp Source: Oral (03/02 0737) BP: 120/85 (03/02 1121) Pulse Rate: 90 (03/02 1121)  Labs: Recent Labs    01/20/21 1248 01/20/21 2107 01/21/21 0315 01/22/21 0548 01/22/21 1350  HGB 12.0  --  11.7* 11.2*  --   HCT 38.2  --  37.5 35.4*  --   PLT 275  --  266 227  --   APTT 32  --   --   --   --   LABPROT 12.6  --   --   --   --   INR 1.0  --   --   --   --   HEPARINUNFRC  --    < > 0.47 0.25* 0.26*  CREATININE 0.75  --  0.64 0.67  --   TROPONINIHS  --   --   --   --  48*   < > = values in this interval not displayed.    Estimated Creatinine Clearance: 139.6 mL/min (by C-G formula based on SCr of 0.67 mg/dL).   Medications:  PTA: lovenox 120mg  q12h (last dose 2/27 ~2100) Heparin Dosing Weight: 98.1 kg  Assessment: 40yo Female with history of factor V Leiden (heterozygous - lovenox PTA), history of DVT, obesity & PCOS presenting with SOB and tachycardia (HR 150s).   Pharmacy consulted for the management of heparin gtt ISO PE with right heart strain.  Baseline:  H/H/Plts WNL;  INR 1.0; aPTT 32s; D-Dimer 2.86  Date Time HL Rate/Comment 2/28 2107 0.6 1600 units/hr 3/01     0315    0.47     1600 units/hr  3/02  0548 0.25 1600 units/hr  Goal of Therapy:  Heparin level 0.3-0.7 units/ml Monitor platelets by anticoagulation protocol: Yes   Plan:  3/2:  HL @ 1350 = 0.26 , sub-therapeutic - heparin was stopped for a time during yesterday's  Vascular procedure  Will  bolus 2000 units and increase rate to 1850 units/hr   Will recheck HL in 6 hours per protocol.   CBC's daily while on heparin drip  Lu Duffel, PharmD, BCPS Clinical Pharmacist 01/22/2021 2:34 PM

## 2021-01-22 NOTE — Progress Notes (Signed)
Pt. A/Ox4 can verbally respond with clear speech, no issues with hearing,and resting in bed all during shift. Patient continent of B/B. Skin warm, dry, and intact with no s/s of impairment noted. Tolerated prn Tylenol 650 mg for headache well, effective within on hour. Pt resting in bed no s/s of bleeding from surgical site. Denies distress, discomfort, and acute pain at this time.

## 2021-01-22 NOTE — Progress Notes (Signed)
[facility] Daily Progress Note   Subjective: 01/21/21:             1.  Contrast injection right heart             2.  Thrombolysis bilateral pulmonary arteries             3.  Mechanical thrombectomy bilateral pulmonary arteries             4.  Selective catheter placement right upper middle and lower lobe pulmonary arteries             5.  Selective catheter placement left upper and lower lobe pulmonary arteries  Patient without complaint this AM with exception of some coughing.  No issues overnight.  Objective: Vitals:   01/22/21 0350 01/22/21 0737 01/22/21 0755 01/22/21 1121  BP: 95/65 108/72  120/85  Pulse: 83 81  90  Resp: 18 18  16   Temp: 97.9 F (36.6 C) 97.9 F (36.6 C)  97.6 F (36.4 C)  TempSrc:  Oral    SpO2: 96% 97% 97% 97%  Weight:      Height:        Intake/Output Summary (Last 24 hours) at 01/22/2021 1301 Last data filed at 01/22/2021 0900 Gross per 24 hour  Intake 246.25 ml  Output --  Net 246.25 ml   Physical Exam: A&Ox3, NAD CV: RRR Pulmonary: CTA Bilaterally Abdomen: Soft, Nontender, Nondistended Vascular:   Laboratory: CBC    Component Value Date/Time   WBC 5.8 01/22/2021 0548   HGB 11.2 (L) 01/22/2021 0548   HGB 9.5 (L) 03/06/2020 1132   HCT 35.4 (L) 01/22/2021 0548   HCT 30.4 (L) 03/06/2020 1132   PLT 227 01/22/2021 0548   PLT 342 03/06/2020 1132   BMET    Component Value Date/Time   NA 139 01/22/2021 0548   NA 140 09/05/2019 1432   NA 138 09/14/2013 2027   K 3.8 01/22/2021 0548   K 3.7 09/14/2013 2027   CL 106 01/22/2021 0548   CL 105 09/14/2013 2027   CO2 23 01/22/2021 0548   CO2 25 09/14/2013 2027   GLUCOSE 102 (H) 01/22/2021 0548   GLUCOSE 106 (H) 09/14/2013 2027   BUN 14 01/22/2021 0548   BUN 18 09/05/2019 1432   BUN 17 09/14/2013 2027   CREATININE 0.67 01/22/2021 0548   CREATININE 0.79 08/02/2018 1107   CALCIUM 8.9 01/22/2021 0548   CALCIUM 8.9 09/14/2013 2027   GFRNONAA >60 01/22/2021 0548    GFRNONAA 96 08/02/2018 1107   GFRAA 108 09/05/2019 1432   GFRAA 111 08/02/2018 1107   Assessment/Planning: The patient is a 40 year old female who presents with DVT/PE with known factor V Leiden status post pulmonary thrombectomy/pulmonary lysis  1) Patient with known history of factor V Leiden however at times has struggled with compliance.  Patient was previously placed on Lovenox while she is attempting to become pregnant.  We had a long discussion today about oral anticoagulation.  The patient would like to try Xarelto versus Eliquis as she feels the once a day dosing would be easier for compliance. 2) Patient asking for referral for a new primary care physician and hematologist.  Sounds like the patient may have [facility] insurance and some [facility] physicians are not covered.   3) From a vascular standpoint, the patient can be transitioned to oral anticoagulation and discharged home when medically stable.  Discussed with Dr. Eber Hong Meiko Ives PA-C 01/22/2021 1:01 PM

## 2021-01-22 NOTE — Discharge Instructions (Addendum)
Vascular Surgery Discharge Instructions: 1) You may shower.  Please keep your groin access site clean and dry.  Gently clean with soap and water.  Gently pat dry. 2) Please do not engage in strenuous activity or lifting greater than 10 pounds x 2 weeks.

## 2021-01-22 NOTE — Plan of Care (Signed)
°  Problem: Clinical Measurements: °Goal: Will remain free from infection °Outcome: Progressing °Goal: Respiratory complications will improve °Outcome: Progressing °  °

## 2021-01-22 NOTE — Consult Note (Signed)
ANTICOAGULATION CONSULT NOTE - Follow Up Consult  Pharmacy Consult for heparin Indication: pulmonary embolus w/ RH strain  Allergies  Allergen Reactions  . Covid-19 (Mrna) Vaccine Itching and Other (See Comments)    Lip swelling and tingling, throat itching, itching and redness on face, scalp and chest.     Patient Measurements: Height: 5\' 8"  (172.7 cm) Weight: (!) 140.6 kg (309 lb 15.5 oz) IBW/kg (Calculated) : 63.9 Heparin Dosing Weight: 98.1 kg  Vital Signs: Temp: 97.9 F (36.6 C) (03/02 0737) Temp Source: Oral (03/02 0737) BP: 108/72 (03/02 0737) Pulse Rate: 81 (03/02 0737)  Labs: Recent Labs    01/20/21 1248 01/20/21 2107 01/21/21 0315 01/22/21 0548  HGB 12.0  --  11.7* 11.2*  HCT 38.2  --  37.5 35.4*  PLT 275  --  266 227  APTT 32  --   --   --   LABPROT 12.6  --   --   --   INR 1.0  --   --   --   HEPARINUNFRC  --  0.60 0.47 0.25*  CREATININE 0.75  --  0.64 0.67    Estimated Creatinine Clearance: 139.6 mL/min (by C-G formula based on SCr of 0.67 mg/dL).   Medications:  PTA: lovenox 120mg  q12h (last dose 2/27 ~2100) Heparin Dosing Weight: 98.1 kg  Assessment: 40yo Female with history of factor V Leiden (heterozygous - lovenox PTA), history of DVT, obesity & PCOS presenting with SOB and tachycardia (HR 150s).   Pharmacy consulted for the management of heparin gtt ISO PE with right heart strain.  Baseline:  H/H/Plts WNL;  INR 1.0; aPTT 32s; D-Dimer 2.86  Date Time HL Rate/Comment 2/28 2107 0.6 1600 units/hr 3/01     0315    0.47     1600 units/hr  3/02  0548 0.25 1600 units/hr  Goal of Therapy:  Heparin level 0.3-0.7 units/ml Monitor platelets by anticoagulation protocol: Yes   Plan:  3/2:  HL @ 0548 = 0.25 , sub-therapeutic - heparin was stopped for a time during yesterday's  vascularprocedure  Will bolus 2000 units and increase rate to 1700 units/hr   Will recheck HL in 6 hours per protocol.   CBC's daily while on heparin drip  Lu Duffel, PharmD, BCPS Clinical Pharmacist 01/22/2021 8:02 AM

## 2021-01-22 NOTE — Consult Note (Signed)
ANTICOAGULATION CONSULT NOTE - Follow Up Consult  Pharmacy Consult for heparin Indication: pulmonary embolus w/ RH strain  Allergies  Allergen Reactions  . Covid-19 (Mrna) Vaccine Itching and Other (See Comments)    Lip swelling and tingling, throat itching, itching and redness on face, scalp and chest.     Patient Measurements: Height: 5\' 8"  (172.7 cm) Weight: (!) 140.6 kg (309 lb 15.5 oz) IBW/kg (Calculated) : 63.9 Heparin Dosing Weight: 98.1 kg  Vital Signs: Temp: 98.1 F (36.7 C) (03/02 1930) BP: 112/85 (03/02 1930) Pulse Rate: 92 (03/02 1930)  Labs: Recent Labs    01/20/21 1248 01/20/21 2107 01/21/21 0315 01/22/21 0548 01/22/21 1350 01/22/21 1546 01/22/21 2120  HGB 12.0  --  11.7* 11.2*  --   --   --   HCT 38.2  --  37.5 35.4*  --   --   --   PLT 275  --  266 227  --   --   --   APTT 32  --   --   --   --   --   --   LABPROT 12.6  --   --   --   --   --   --   INR 1.0  --   --   --   --   --   --   HEPARINUNFRC  --    < > 0.47 0.25* 0.26*  --  0.44  CREATININE 0.75  --  0.64 0.67  --   --   --   TROPONINIHS  --   --   --   --  48* 50*  --    < > = values in this interval not displayed.    Estimated Creatinine Clearance: 139.6 mL/min (by C-G formula based on SCr of 0.67 mg/dL).   Medications:  PTA: lovenox 120mg  q12h (last dose 2/27 ~2100) Heparin Dosing Weight: 98.1 kg  Assessment: 40yo Female with history of factor V Leiden (heterozygous - lovenox PTA), history of DVT, obesity & PCOS presenting with SOB and tachycardia (HR 150s).   Pharmacy consulted for the management of heparin gtt ISO PE with right heart strain.  Baseline:  H/H/Plts WNL;  INR 1.0; aPTT 32s; D-Dimer 2.86  Date Time HL Rate/Comment 2/28 2107 0.6 1600 units/hr 3/01     0315    0.47     1600 units/hr  3/02  0548 0.25 1600 units/hr 3/02 2120 0.44 1850 units/hr  Goal of Therapy:  Heparin level 0.3-0.7 units/ml Monitor platelets by anticoagulation protocol: Yes   Plan:    3/2:  2120 HL therapeutic x 1 (0.44)  Will continue rate at 1850 units/hr and recheck HL in am to confirm  CBC's daily while on heparin drip  Ena Dawley, PharmD Clinical Pharmacist 01/22/2021 10:36 PM

## 2021-01-23 ENCOUNTER — Other Ambulatory Visit: Payer: Self-pay | Admitting: Student

## 2021-01-23 ENCOUNTER — Telehealth: Payer: Self-pay | Admitting: Internal Medicine

## 2021-01-23 ENCOUNTER — Telehealth (INDEPENDENT_AMBULATORY_CARE_PROVIDER_SITE_OTHER): Payer: Self-pay | Admitting: Vascular Surgery

## 2021-01-23 DIAGNOSIS — R000 Tachycardia, unspecified: Secondary | ICD-10-CM

## 2021-01-23 DIAGNOSIS — R778 Other specified abnormalities of plasma proteins: Secondary | ICD-10-CM

## 2021-01-23 LAB — HEPARIN LEVEL (UNFRACTIONATED): Heparin Unfractionated: 0.58 IU/mL (ref 0.30–0.70)

## 2021-01-23 LAB — CBC
HCT: 35.3 % — ABNORMAL LOW (ref 36.0–46.0)
Hemoglobin: 11.2 g/dL — ABNORMAL LOW (ref 12.0–15.0)
MCH: 26.5 pg (ref 26.0–34.0)
MCHC: 31.7 g/dL (ref 30.0–36.0)
MCV: 83.6 fL (ref 80.0–100.0)
Platelets: 234 10*3/uL (ref 150–400)
RBC: 4.22 MIL/uL (ref 3.87–5.11)
RDW: 13.7 % (ref 11.5–15.5)
WBC: 5.8 10*3/uL (ref 4.0–10.5)
nRBC: 0 % (ref 0.0–0.2)

## 2021-01-23 MED ORDER — RIVAROXABAN 20 MG PO TABS: 20.0000 mg | ORAL_TABLET | Freq: Every day | ORAL | 1 refills | Status: DC

## 2021-01-23 MED ORDER — RIVAROXABAN (XARELTO) VTE STARTER PACK (15 & 20 MG): ORAL_TABLET | ORAL | 0 refills | Status: DC

## 2021-01-23 MED ORDER — RIVAROXABAN 20 MG PO TABS: 20.0000 mg | ORAL_TABLET | Freq: Every day | ORAL | Status: DC

## 2021-01-23 MED ORDER — RIVAROXABAN 15 MG PO TABS
15.0000 mg | ORAL_TABLET | Freq: Two times a day (BID) | ORAL | Status: DC
  Administered 2021-01-23: 09:00:00 15 mg via ORAL
  Filled 2021-01-23 (×2): qty 1

## 2021-01-23 MED ORDER — METFORMIN HCL 500 MG PO TABS: 500.0000 mg | ORAL_TABLET | Freq: Two times a day (BID) | ORAL | 3 refills | Status: DC

## 2021-01-23 NOTE — Telephone Encounter (Signed)
I discussed with Dr. Delana Meyer and he feels that the best course of action would be to ensure continuity of care.  Therefore, the person that she follows up with should order her ultrasounds.  Dr. Delana Meyer believes that it would be a good idea to have Dr. Milinda Cave with pulmonology follow up, given the PE and recent shortness of breath.  Dr. Milinda Cave should be in network with [facility] as well. He is planning to discuss with Dr. Milinda Cave directly and will then place a referral.  He also believes that she has a hematologist with [facility] as well and they can also follow her DVT.

## 2021-01-23 NOTE — Telephone Encounter (Signed)
Called stating that she spoke with her insurance company and [facility] is out of her network. Insurance stated that we can order the [place] at [facility] and she can have it done there but she would need to have her post op somewhere in network. Patient recently had pulmonary thrombectomy done 01/21/21 ([facility]).  [facility] can you order le ven dvt at [facility]?

## 2021-01-23 NOTE — Telephone Encounter (Signed)
Pt called stating she just got out of hospital and needed to follow up with [place]. She has insurance we wasn't in network last year but now she says only [facility] and [facility] can she her cause they are in her network. Please check out chart and advise me on what needs to be done. Not sure [facility] is taking anymore new ones due to changing positions

## 2021-01-23 NOTE — Consult Note (Addendum)
ANTICOAGULATION CONSULT NOTE - Follow Up Consult  Pharmacy Consult for heparin Indication: pulmonary embolus w/ RH strain  Allergies  Allergen Reactions  . Covid-19 (Mrna) Vaccine Itching and Other (See Comments)    Lip swelling and tingling, throat itching, itching and redness on face, scalp and chest.     Patient Measurements: Height: 5\' 8"  (172.7 cm) Weight: (!) 140.6 kg (309 lb 15.5 oz) IBW/kg (Calculated) : 63.9 Heparin Dosing Weight: 98.1 kg  Vital Signs: Temp: 98.6 F (37 C) (03/03 0349) Temp Source: Oral (03/03 0349) BP: 98/62 (03/03 0349) Pulse Rate: 86 (03/03 0349)  Labs: Recent Labs    01/20/21 1248 01/20/21 2107 01/21/21 0315 01/22/21 0548 01/22/21 1350 01/22/21 1546 01/22/21 2120 01/23/21 0453  HGB 12.0  --  11.7* 11.2*  --   --   --  11.2*  HCT 38.2  --  37.5 35.4*  --   --   --  35.3*  PLT 275  --  266 227  --   --   --  234  APTT 32  --   --   --   --   --   --   --   LABPROT 12.6  --   --   --   --   --   --   --   INR 1.0  --   --   --   --   --   --   --   HEPARINUNFRC  --    < > 0.47 0.25* 0.26*  --  0.44 0.58  CREATININE 0.75  --  0.64 0.67  --   --   --   --   TROPONINIHS  --   --   --   --  48* 50*  --   --    < > = values in this interval not displayed.    Estimated Creatinine Clearance: 139.6 mL/min (by C-G formula based on SCr of 0.67 mg/dL).   Medications:  PTA: lovenox 120mg  q12h (last dose 2/27 ~2100) Heparin Dosing Weight: 98.1 kg  Assessment: 40yo Female with history of factor V Leiden (heterozygous - lovenox PTA), history of DVT, obesity & PCOS presenting with SOB and tachycardia (HR 150s).   Pharmacy consulted for the management of heparin gtt ISO PE with right heart strain.  Baseline:  H/H/Plts WNL;  INR 1.0; aPTT 32s; D-Dimer 2.86  Date Time HL Rate/Comment 2/28 2107 0.6 1600 units/hr 3/01     0315    0.47     1600 units/hr  3/02  0548 0.25 1600 units/hr 3/02 2120 0.44 1850 units/hr 3/03 0453 0.58 1850  units/hr  Goal of Therapy:  Heparin level 0.3-0.7 units/ml Monitor platelets by anticoagulation protocol: Yes   Plan:   3/3:  0453 HL therapeutic x 2   Will continue rate at 1850 units/hr and recheck HL in am   CBC's daily while on heparin drip  Ena Dawley, PharmD Clinical Pharmacist 01/23/2021 6:20 AM

## 2021-01-23 NOTE — Discharge Summary (Signed)
Physician Discharge Summary  Tara Shea VQQ:[id number] DOB: 19-May-1981 DOA: 01/20/2021  PCP: [place] date: 01/20/2021 Discharge date: 01/23/2021  Admitted From: Home Disposition: Home  Recommendations for Outpatient Follow-up:  1. Follow ups as below. 2. Ambulatory referral to [facility] ordered. 3. Please obtain CBC/BMP/Mag at follow up 4. Please follow up on the following pending results: None  Home Health: None required Equipment/Devices: None required  Discharge Condition: Stable CODE STATUS: Full code   Follow-up Information    Schnier, Dolores Lory, MD. Go on 02/10/2021.   Specialties: Vascular Surgery, Cardiology, Radiology, Vascular Surgery Why: at 3:30pm; Will need right lower extremity DVT study with visit. Contact information: [place] [address] [phone]                Hospital Course: 40 year old with a history of factor V Leiden mutation, bilateral lower extremity DVTs, PCOS, asthma, uterine fibroids, and migraine headaches who presented to the ER with 1 week of shortness of breath after "missing a couple of doses of Lovenox." In the ED she was found to have an elevated D-dimer. CT angio of the chest was positive for bilateral submassive PEs with some concern for right heart strain. Lower extremity venous duplex was positive for left lower extremity DVT extending through the popliteal, posterior tibial, and peroneal veins.  She underwent thrombolysis and thrombectomy for PE on 01/21/2021.  She was treated with IV heparin.  Eventually transitioned to Xarelto, and cleared for discharge by vascular surgery.  Outpatient follow-up as above.  We have also ordered ambulatory referral to hematology.   See individual problem list below for more on hospital course.  Discharge Diagnoses:  Acute bilateral PE/LLE DVT in patient with history of factor V Leyden mutation-likely due to missed dose of Lovenox,  obesity, use of letrozole and factor V mutation.  -R heart pressures increased on TTE, but no evidence ofsignigR heart failure -S/p thrombectomy/thrombolysis of PE by vascular surgery on 01/21/2021. -Treated with IV heparin and transitioned to p.o. Xarelto.  Discharged on starter pack. -Discontinued Lovenox and letrozole. No plan for pregnancy anymore. -Consider other form of contraceptive methods -Ambulatory referral to [facility] hematologist-insurance preference -Outpatient follow-up with vascular surgery as above  Severe DOE/exertional chest pain-likely due to the above.  Resolved.  Mildly elevated troponin: Likely demand ischemia due to PE.  EKG and echo reassuring.  Exertional tachycardia: Not symptomatic.  Denies feeling lightheaded.  Likely from PE.  Quickly recovers to normal  Asthma: Stable -Continue as needed albuterol at home-  Chronic iron deficiency anemia: H&H stable. Recent Labs    03/06/20 1132 01/20/21 1248 01/21/21 0315 01/22/21 0548 01/23/21 0453  HGB 9.5* 12.0 11.7* 11.2* 11.2*  -Recheck CBC at follow-up.   Morbid obesity/PCOS Body mass index is 47.13 kg/m.  -Started low-dose Metformin weeks          Discharge Exam: Vitals:   01/23/21 0805 01/23/21 0822  BP:  124/83  Pulse:  92  Resp:  18  Temp:  98.7 F (37.1 C)  SpO2: 98% 99%    GENERAL: No apparent distress.  Nontoxic. HEENT: MMM.  Vision and hearing grossly intact.  NECK: Supple.  No apparent JVD.  RESP: On RA.  No IWOB.  Fair aeration bilaterally. CVS:  RRR. Heart sounds normal.  ABD/GI/GU: Bowel sounds present. Soft. Non tender.  MSK/EXT:  Moves extremities. No apparent deformity. No edema.  SKIN: no apparent skin lesion or wound NEURO: Awake, alert and oriented appropriately.  No apparent focal neuro deficit. PSYCH: Calm. Normal affect.  Discharge Instructions  Discharge Instructions    Ambulatory referral to Hematology   Complete by: As directed    Patient likes to follow  up at [facility] due to insurance preference   Call MD for:  difficulty breathing, headache or visual disturbances   Complete by: As directed    Call MD for:  persistant dizziness or light-headedness   Complete by: As directed    Call MD for:  severe uncontrolled pain   Complete by: As directed    Diet general   Complete by: As directed    Discharge instructions   Complete by: As directed    It has been a pleasure taking care of you!  You were hospitalized due to pulmonary embolism and deep venous thrombosis.  We have started you on Xarelto, and ordered referral to [facility] hematology group.  We have also stopped you letrozole which could increase your risk of blood clot.  We also recommend you establish care with a primary care doctor as soon as possible.   Take care,   Increase activity slowly   Complete by: As directed      Allergies as of 01/23/2021      Reactions   Covid-19 (mrna) Vaccine Itching, Other (See Comments)   Lip swelling and tingling, throat itching, itching and redness on face, scalp and chest.       Medication List    STOP taking these medications   enoxaparin 120 MG/0.8ML injection Commonly known as: Lovenox   letrozole 2.5 MG tablet Commonly known as: FEMARA     TAKE these medications   AeroChamber Plus inhaler Use as instructed   albuterol (2.5 MG/3ML) 0.083% nebulizer solution Commonly known as: PROVENTIL Take 3 mLs (2.5 mg total) by nebulization every 6 (six) hours as needed for wheezing or shortness of breath.   albuterol 108 (90 Base) MCG/ACT inhaler Commonly known as: VENTOLIN HFA Inhale 2 puffs into the lungs every 4 (four) hours as needed for wheezing or shortness of breath.   ferrous sulfate 325 (65 FE) MG tablet Take 325 mg by mouth daily.   Flovent HFA 220 MCG/ACT inhaler Generic drug: fluticasone Inhale 1 puff into the lungs daily.   FOLIC ACID PO Take by mouth.   metFORMIN 500 MG tablet Commonly known as: Glucophage Take 1 tablet (500  mg total) by mouth 2 (two) times daily with a meal.   multivitamin with minerals tablet Take 1 tablet by mouth daily.   ondansetron 4 MG disintegrating tablet Commonly known as: Zofran ODT Take 1 tablet (4 mg total) by mouth every 6 (six) hours as needed for nausea.   promethazine 25 MG tablet Commonly known as: PHENERGAN Take 1 tablet (25 mg total) by mouth every 8 (eight) hours as needed for nausea or vomiting.   Rivaroxaban Stater Pack (15 mg and 20 mg) Commonly known as: XARELTO STARTER PACK Follow package directions: Take one 15mg  tablet by mouth twice a day. On day 22, switch to one 20mg  tablet once a day. Take with food.   rivaroxaban 20 MG Tabs tablet Commonly known as: XARELTO Take 1 tablet (20 mg total) by mouth daily with supper. Start after you finish the starter pack Start taking on: February 20, 2021   vitamin B-12 1000 MCG tablet Commonly known as: CYANOCOBALAMIN Take 1 tablet (1,000 mcg total) by mouth daily.   vitamin C 500 MG tablet Commonly known as: ASCORBIC ACID Take 500 mg by  mouth daily.       Consultations:  Vascular surgery  Procedures/Studies:  01/21/2021-thrombolysis/thrombectomy of PE  2D Echo on 01/20/2021 1. Left ventricular ejection fraction, by estimation, is 55 to 60%. The  left ventricle has normal function. Left ventricular endocardial border  not optimally defined to evaluate regional wall motion. Left ventricular  diastolic parameters were normal.  2. Pulmonary artery pressure is at least upper normal to mildly elevated  (PASP 30 mmHg plus central venous pressure). Right ventricular systolic  function is normal. The right ventricular size is normal.  3. The mitral valve is normal in structure. Trivial mitral valve  regurgitation. No evidence of mitral stenosis.  4. The aortic valve was not well visualized. Aortic valve regurgitation  is not visualized. No aortic stenosis is present.    DG Chest 2 View  Result Date:  01/20/2021 CLINICAL DATA:  Dyspnea on exertion. EXAM: CHEST - 2 VIEW COMPARISON:  None. FINDINGS: The heart size and mediastinal contours are within normal limits. Both lungs are clear. The visualized skeletal structures are unremarkable. IMPRESSION: No active cardiopulmonary disease. Electronically Signed   By: Franchot Gallo M.D.   On: 01/20/2021 13:29   CT Angio Chest PE W and/or Wo Contrast  Result Date: 01/20/2021 CLINICAL DATA:  Evaluate for acute pulmonary embolus.  Low EXAM: CT ANGIOGRAPHY CHEST WITH CONTRAST TECHNIQUE: Multidetector CT imaging of the chest was performed using the standard protocol during bolus administration of intravenous contrast. Multiplanar CT image reconstructions and MIPs were obtained to evaluate the vascular anatomy. CONTRAST:  74mL OMNIPAQUE IOHEXOL 350 MG/ML SOLN COMPARISON:  Chest x-ray from earlier today FINDINGS: Cardiovascular: Examination is positive for acute pulmonary embolus. Large bilateral filling defects are noted within the distal right mainstem artery as well as the right lower lobar and segmental pulmonary arteries clot is also identified within the left lower lobe are and left lower segmental and subsegmental pulmonary arteries. Positive for acute PE with CTevidence of right heart strain (RV/LV Ratio = 1.04) consistent with at least submassive (intermediate risk) PE. The presence of right heart strain has been associated with an increased risk of morbidity and mortality. No pericardial effusion identified. Mediastinum/Nodes: No enlarged mediastinal, hilar, or axillary lymph nodes. Thyroid gland, trachea, and esophagus demonstrate no significant findings. Lungs/Pleura: Lungs are clear. No pleural effusion or pneumothorax. Upper Abdomen: No acute abnormality.  Previous cholecystectomy. Musculoskeletal: No chest wall abnormality. No acute or significant osseous findings. Review of the MIP images confirms the above findings. IMPRESSION: Examination is positive for  acute pulmonary embolus with CTevidence of right heart strain (RV/LV Ratio 1.04) consistent with at least submassive (intermediate risk) PE. The presence of right heart strain has been associated with an increased risk of morbidity and mortality. Critical Value/emergent results were called by telephone at the time of interpretation on 01/20/2021 at 2:05 pm to provider [facility] , who verbally acknowledged these results. Electronically Signed   By: Kerby Moors M.D.   On: 01/20/2021 14:06   PERIPHERAL VASCULAR CATHETERIZATION  Result Date: 01/21/2021 See op note  US Venous Img Lower Bilateral (DVT)  Result Date: 01/20/2021 CLINICAL DATA:  Acute PE, shortness of breath. EXAM: BILATERAL LOWER EXTREMITY VENOUS DOPPLER ULTRASOUND TECHNIQUE: Gray-scale sonography with graded compression, as well as color Doppler and duplex ultrasound were performed to evaluate the lower extremity deep venous systems from the level of the common femoral vein and including the common femoral, femoral, profunda femoral, popliteal and calf veins including the posterior tibial, peroneal and gastrocnemius veins  when visible. The superficial great saphenous vein was also interrogated. Spectral Doppler was utilized to evaluate flow at rest and with distal augmentation maneuvers in the common femoral, femoral and popliteal veins. COMPARISON:  November 15, 2019. FINDINGS: RIGHT LOWER EXTREMITY Common Femoral Vein: No evidence of thrombus. Normal compressibility, respiratory phasicity and response to augmentation. Saphenofemoral Junction: No evidence of thrombus. Normal compressibility and flow on color Doppler imaging. Profunda Femoral Vein: No evidence of thrombus. Normal compressibility and flow on color Doppler imaging. Femoral Vein: No evidence of thrombus. Normal compressibility, respiratory phasicity and response to augmentation. Popliteal Vein: No evidence of thrombus. Normal compressibility, respiratory phasicity and response to  augmentation. Calf Veins: No evidence of thrombus. Normal compressibility and flow on color Doppler imaging. Venous Reflux:  None. LEFT LOWER EXTREMITY Common Femoral Vein: No evidence of thrombus. Normal compressibility, respiratory phasicity and response to augmentation. Saphenofemoral Junction: No evidence of thrombus. Normal compressibility and flow on color Doppler imaging. Profunda Femoral Vein: No evidence of thrombus. Normal compressibility and flow on color Doppler imaging. Femoral Vein: No evidence of thrombus. Normal compressibility, respiratory phasicity and response to augmentation. Popliteal Vein: Nonocclusive thrombus.  Diminished compressibility. Calf Veins: Occlusive thrombus involving the posterior tibial vein and peroneal vein. Diminished compressibility. Venous Reflux:  None. IMPRESSION: Evidence of deep venous thrombosis involving the left popliteal vein, posterior tibial vein, and peroneal veins. These results will be called to the ordering clinician or representative by the Radiologist Assistant, and communication documented in the PACS or [employer]. Electronically Signed   By: Margaretha Sheffield MD   On: 01/20/2021 17:09   ECHOCARDIOGRAM COMPLETE  Result Date: 01/21/2021    ECHOCARDIOGRAM REPORT   Patient Name:   Tara Shea Date of Exam: 01/20/2021 Medical Rec #:  [id number]            Height:       68.0 in Accession #:    [id number]           Weight:       310.0 lb Date of Birth:  [birth year]           BSA:          2.462 m Patient Age:    13 years             BP:           126/89 mmHg Patient Gender: F                    HR:           74 bpm. Exam Location:  [facility] Procedure: 2D Echo, Cardiac Doppler and Color Doppler Indications:     Pulmonary Embolus I26.09  History:         Patient has no prior history of Echocardiogram examinations.  Sonographer:     Alyse Low Roar Referring Phys:  Unknown Foley NIU Diagnosing Phys: Nelva Bush MD IMPRESSIONS  1. Left ventricular ejection  fraction, by estimation, is 55 to 60%. The left ventricle has normal function. Left ventricular endocardial border not optimally defined to evaluate regional wall motion. Left ventricular diastolic parameters were normal.  2. Pulmonary artery pressure is at least upper normal to mildly elevated (PASP 30 mmHg plus central venous pressure). Right ventricular systolic function is normal. The right ventricular size is normal.  3. The mitral valve is normal in structure. Trivial mitral valve regurgitation. No evidence of mitral stenosis.  4. The aortic valve was not well visualized. Aortic valve regurgitation  is not visualized. No aortic stenosis is present. FINDINGS  Left Ventricle: Left ventricular ejection fraction, by estimation, is 55 to 60%. The left ventricle has normal function. Left ventricular endocardial border not optimally defined to evaluate regional wall motion. The left ventricular internal cavity size was normal in size. There is borderline left ventricular hypertrophy. Left ventricular diastolic parameters were normal. Right Ventricle: Pulmonary artery pressure is at least upper normal to mildly elevated (PASP 30 mmHg plus central venous pressure). The right ventricular size is normal. No increase in right ventricular wall thickness. Right ventricular systolic function  is normal. Left Atrium: Left atrial size was normal in size. Right Atrium: Right atrial size was normal in size. Pericardium: Trivial pericardial effusion is present. Mitral Valve: The mitral valve is normal in structure. Trivial mitral valve regurgitation. No evidence of mitral valve stenosis. Tricuspid Valve: The tricuspid valve is normal in structure. Tricuspid valve regurgitation is mild. Aortic Valve: The aortic valve was not well visualized. Aortic valve regurgitation is not visualized. No aortic stenosis is present. Aortic valve peak gradient measures 3.5 mmHg. Pulmonic Valve: The pulmonic valve was grossly normal. Pulmonic valve  regurgitation is trivial. No evidence of pulmonic stenosis. Aorta: The aortic root is normal in size and structure. Pulmonary Artery: The pulmonary artery is not well seen. Venous: The inferior vena cava was not well visualized. IAS/Shunts: The interatrial septum was not well visualized.  LEFT VENTRICLE PLAX 2D LVIDd:         4.80 cm  Diastology LVIDs:         3.50 cm  LV e' medial:    11.40 cm/s LV PW:         1.00 cm  LV E/e' medial:  7.4 LV IVS:        1.00 cm  LV e' lateral:   12.90 cm/s LVOT diam:     2.00 cm  LV E/e' lateral: 6.5 LVOT Area:     3.14 cm  RIGHT VENTRICLE RV Mid diam:    3.00 cm RV S prime:     14.30 cm/s TAPSE (M-mode): 1.9 cm LEFT ATRIUM             Index       RIGHT ATRIUM           Index LA diam:        3.90 cm 1.58 cm/m  RA Area:     11.10 cm LA Vol (A2C):   48.1 ml 19.54 ml/m RA Volume:   20.10 ml  8.16 ml/m LA Vol (A4C):   41.2 ml 16.73 ml/m LA Biplane Vol: 46.9 ml 19.05 ml/m  AORTIC VALVE               PULMONIC VALVE AV Area (Vmax): 2.63 cm   PV Vmax:        0.98 m/s AV Vmax:        93.70 cm/s PV Peak grad:   3.8 mmHg AV Peak Grad:   3.5 mmHg   RVOT Peak grad: 1 mmHg LVOT Vmax:      78.40 cm/s  AORTA Ao Root diam: 2.80 cm MITRAL VALVE               TRICUSPID VALVE MV Area (PHT): 5.13 cm    TR Peak grad:   29.8 mmHg MV Decel Time: 148 msec    TR Vmax:        273.00 cm/s MV E velocity: 84.40 cm/s MV A velocity: 56.60 cm/s  SHUNTS  MV E/A ratio:  1.49        Systemic Diam: 2.00 cm MV A Prime:    9.1 cm/s Nelva Bush MD Electronically signed by Nelva Bush MD Signature Date/Time: 01/21/2021/7:23:19 AM    Final         The results of significant diagnostics from this hospitalization (including imaging, microbiology, ancillary and laboratory) are listed below for reference.     Microbiology: Recent Results (from the past 240 hour(s))  SARS CORONAVIRUS 2 (TAT 6-24 HRS) Nasopharyngeal Nasopharyngeal Swab     Status: None   Collection Time: 01/20/21  1:36 PM   Specimen:  Nasopharyngeal Swab  Result Value Ref Range Status   SARS Coronavirus 2 NEGATIVE NEGATIVE Final    Comment: (NOTE) SARS-CoV-2 target nucleic acids are NOT DETECTED.  The SARS-CoV-2 RNA is generally detectable in upper and lower respiratory specimens during the acute phase of infection. Negative results do not preclude SARS-CoV-2 infection, do not rule out co-infections with other pathogens, and should not be used as the sole basis for treatment or other patient management decisions. Negative results must be combined with clinical observations, patient history, and epidemiological information. The expected result is Negative.  Fact Sheet for Patients: SugarRoll.be  Fact Sheet for Healthcare Providers: https://www.woods-mathews.com/  This test is not yet approved or cleared by the [place] FDA and  has been authorized for detection and/or diagnosis of SARS-CoV-2 by FDA under an Emergency Use Authorization (EUA). This EUA will remain  in effect (meaning this test can be used) for the duration of the COVID-19 declaration under Se ction 564(b)(1) of the Act, 21 U.S.C. section 360bbb-3(b)(1), unless the authorization is terminated or revoked sooner.  Performed at [facility], [place] [address] [place], [place] [address]      Labs:  CBC: Recent Labs  Lab 01/20/21 1248 01/21/21 0315 01/22/21 0548 01/23/21 0453  WBC 5.1 5.0 5.8 5.8  HGB 12.0 11.7* 11.2* 11.2*  HCT 38.2 37.5 35.4* 35.3*  MCV 82.9 82.8 83.7 83.6  PLT 275 266 227 234   BMP &GFR Recent Labs  Lab 01/20/21 1248 01/21/21 0315 01/22/21 0548  NA 136 137 139  K 3.5 3.7 3.8  CL 106 106 106  CO2 21* 23 23  GLUCOSE 112* 103* 102*  BUN 16 13 14   CREATININE 0.75 0.64 0.67  CALCIUM 9.0 9.1 8.9   Estimated Creatinine Clearance: 139.6 mL/min (by C-G formula based on SCr of 0.67 mg/dL). Liver & Pancreas: Recent Labs  Lab 01/22/21 0548  AST 13*  ALT 11   ALKPHOS 56  BILITOT 0.5  PROT 6.5  ALBUMIN 3.6   No results for input(s): LIPASE, AMYLASE in the last 168 hours. No results for input(s): AMMONIA in the last 168 hours. Diabetic: No results for input(s): HGBA1C in the last 72 hours. Recent Labs  Lab 01/21/21 0731  GLUCAP 87   Cardiac Enzymes: No results for input(s): CKTOTAL, CKMB, CKMBINDEX, TROPONINI in the last 168 hours. No results for input(s): PROBNP in the last 8760 hours. Coagulation Profile: Recent Labs  Lab 01/20/21 1248  INR 1.0   Thyroid Function Tests: No results for input(s): TSH, T4TOTAL, FREET4, T3FREE, THYROIDAB in the last 72 hours. Lipid Profile: No results for input(s): CHOL, HDL, LDLCALC, TRIG, CHOLHDL, LDLDIRECT in the last 72 hours. Anemia Panel: No results for input(s): VITAMINB12, FOLATE, FERRITIN, TIBC, IRON, RETICCTPCT in the last 72 hours. Urine analysis:    Component Value Date/Time   COLORURINE AMBER (A) 01/20/2021 1226  APPEARANCEUR CLOUDY (A) 01/20/2021 1226   APPEARANCEUR Cloudy 09/14/2013 1947   LABSPEC 1.034 (H) 01/20/2021 1226   LABSPEC 1.034 09/14/2013 1947   PHURINE 5.0 01/20/2021 1226   GLUCOSEU NEGATIVE 01/20/2021 1226   GLUCOSEU Negative 09/14/2013 1947   HGBUR SMALL (A) 01/20/2021 1226   BILIRUBINUR NEGATIVE 01/20/2021 1226   BILIRUBINUR small 03/22/2019 1151   BILIRUBINUR Negative 09/14/2013 1947   KETONESUR 5 (A) 01/20/2021 1226   PROTEINUR 30 (A) 01/20/2021 1226   UROBILINOGEN negative (A) 03/22/2019 1151   NITRITE NEGATIVE 01/20/2021 [place] 01/20/2021 1226   LEUKOCYTESUR Negative 09/14/2013 1947   Sepsis Labs: Invalid input(s): PROCALCITONIN, LACTICIDVEN   Time coordinating discharge: 35 minutes  SIGNED:  Mercy Riding, MD  [facility] 01/23/2021, 2:44 PM  If 7PM-7AM, please contact night-coverage [email or web address]

## 2021-01-23 NOTE — Progress Notes (Signed)
Patient discharged to home wheeled out of unit by transport, accompanied by husband with all belongings.  Medications and discharge instructions reviewed.  All questions answered.  PIV x2 removed, no bleeding, intact.  Patient verbalized understanding of signs and symptoms of infection.  Patient agreed to follow up with appointments as listed on AVS yet she will check with how it works with her insurance.

## 2021-01-23 NOTE — Consult Note (Signed)
ANTICOAGULATION CONSULT NOTE - Follow Up Consult  Pharmacy Consult for Xarelto Treatment Indication: pulmonary embolus w/ RH strain  Allergies  Allergen Reactions  . Covid-19 (Mrna) Vaccine Itching and Other (See Comments)    Lip swelling and tingling, throat itching, itching and redness on face, scalp and chest.     Patient Measurements: Height: 5\' 8"  (172.7 cm) Weight: (!) 140.6 kg (309 lb 15.5 oz) IBW/kg (Calculated) : 63.9 Heparin Dosing Weight: 98.1 kg  Vital Signs: Temp: 98.6 F (37 C) (03/03 0349) Temp Source: Oral (03/03 0349) BP: 98/62 (03/03 0349) Pulse Rate: 86 (03/03 0349)  Labs: Recent Labs    01/20/21 1248 01/20/21 2107 01/21/21 0315 01/22/21 0548 01/22/21 1350 01/22/21 1546 01/22/21 2120 01/23/21 0453  HGB 12.0  --  11.7* 11.2*  --   --   --  11.2*  HCT 38.2  --  37.5 35.4*  --   --   --  35.3*  PLT 275  --  266 227  --   --   --  234  APTT 32  --   --   --   --   --   --   --   LABPROT 12.6  --   --   --   --   --   --   --   INR 1.0  --   --   --   --   --   --   --   HEPARINUNFRC  --    < > 0.47 0.25* 0.26*  --  0.44 0.58  CREATININE 0.75  --  0.64 0.67  --   --   --   --   TROPONINIHS  --   --   --   --  48* 50*  --   --    < > = values in this interval not displayed.    Estimated Creatinine Clearance: 139.6 mL/min (by C-G formula based on SCr of 0.67 mg/dL).   Medications:  PTA: lovenox 120mg  q12h (last dose 2/27 ~2100)   Assessment: 40yo Female with history of factor V Leiden (heterozygous - lovenox PTA), history of DVT, obesity & PCOS presenting with SOB and tachycardia (HR 150s).   Pt is POD 2 mechanical thrombectomy with thrombolysis of bilateral pulmonary arteries by vascular on heparin drip since admission.  Pharmacy now consulted for the start of Xarelto treatment dosing  Baseline:  H/H/Plts WNL;  INR 1.0; aPTT 32s; D-Dimer 2.86  Goal of Therapy:  Monitor platelets by anticoagulation protocol: Yes   Plan:  Will start  Xarelto 15mg  bid with meals x 21 days, followed by Xarelto 20mg  qd with supper thereafter.  Will monitor CBC/SCr a minimum of every 3 days while inpatient per protocol.  Lu Duffel, PharmD Clinical Pharmacist 01/23/2021 7:35 AM

## 2021-01-23 NOTE — Plan of Care (Signed)
°  Problem: Clinical Measurements: °Goal: Will remain free from infection °Outcome: Progressing °Goal: Respiratory complications will improve °Outcome: Progressing °  °

## 2021-01-23 NOTE — Telephone Encounter (Signed)
Cule[place]/Kathy - will you review the patient's insurance. Per patient, Dr. B is not in her network, but Dr. Janese Banks and Grayland Ormond are in her network.

## 2021-01-23 NOTE — Plan of Care (Signed)
  Problem: Clinical Measurements: Goal: Will remain free from infection 01/23/2021 1024 by Orvan Seen, RN Outcome: Completed/Met 01/23/2021 1023 by Orvan Seen, RN Outcome: Progressing Goal: Respiratory complications will improve 01/23/2021 1024 by Orvan Seen, RN Outcome: Completed/Met 01/23/2021 1023 by Orvan Seen, RN Outcome: Progressing

## 2021-01-23 NOTE — Telephone Encounter (Signed)
Made patient aware with medical recommendations and verbalized understanding

## 2021-01-23 NOTE — Progress Notes (Signed)
Patient ambulated x3 laps independently.  Oxygen Saturation remained above 93% mostly in higher level.  HR ranged from 130 to 150 while ambulating but mostly remained in the 140s.  MD aware.

## 2021-01-28 ENCOUNTER — Other Ambulatory Visit: Payer: Self-pay | Admitting: Family Medicine

## 2021-01-28 ENCOUNTER — Ambulatory Visit: Payer: 59 | Admitting: Family Medicine

## 2021-01-28 ENCOUNTER — Other Ambulatory Visit: Payer: Self-pay

## 2021-01-28 ENCOUNTER — Encounter: Payer: Self-pay | Admitting: Family Medicine

## 2021-01-28 VITALS — BP 124/90 | HR 88 | Temp 98.7°F | Resp 16 | Ht 68.0 in | Wt 321.8 lb

## 2021-01-28 DIAGNOSIS — I82492 Acute embolism and thrombosis of other specified deep vein of left lower extremity: Secondary | ICD-10-CM

## 2021-01-28 DIAGNOSIS — T7849XA Other allergy, initial encounter: Secondary | ICD-10-CM | POA: Diagnosis not present

## 2021-01-28 DIAGNOSIS — D6851 Activated protein C resistance: Secondary | ICD-10-CM | POA: Diagnosis not present

## 2021-01-28 DIAGNOSIS — I2609 Other pulmonary embolism with acute cor pulmonale: Secondary | ICD-10-CM

## 2021-01-28 DIAGNOSIS — I82461 Acute embolism and thrombosis of right calf muscular vein: Secondary | ICD-10-CM | POA: Diagnosis not present

## 2021-01-28 DIAGNOSIS — E282 Polycystic ovarian syndrome: Secondary | ICD-10-CM

## 2021-01-28 DIAGNOSIS — T50B95A Adverse effect of other viral vaccines, initial encounter: Secondary | ICD-10-CM | POA: Diagnosis not present

## 2021-01-28 LAB — CBC
HCT: 35.9 % (ref 35.0–45.0)
Hemoglobin: 11.6 g/dL — ABNORMAL LOW (ref 11.7–15.5)
MCH: 26.7 pg — ABNORMAL LOW (ref 27.0–33.0)
MCHC: 32.3 g/dL (ref 32.0–36.0)
MCV: 82.5 fL (ref 80.0–100.0)
MPV: 10 fL (ref 7.5–12.5)
Platelets: 276 10*3/uL (ref 140–400)
RBC: 4.35 10*6/uL (ref 3.80–5.10)
RDW: 14 % (ref 11.0–15.0)
WBC: 4.9 10*3/uL (ref 3.8–10.8)

## 2021-01-28 NOTE — Assessment & Plan Note (Signed)
S/p 2 doses of Pfizer. Referral sent to allergist for component testing per pharmacy recs.

## 2021-01-28 NOTE — Assessment & Plan Note (Signed)
Doing well on Xarelto, plan to continue with starter dose and switch to 20mg  daily 3/31. Has f/u with heme. Awaiting f/u with vasc surg.

## 2021-01-28 NOTE — Patient Instructions (Signed)
It was great to see you!  Our plans for today:  - Continue to take xarelto 15mg  twice daily until 3/31, then switch to 20 mg daily.  - Call the vascular surgeon's office towards the end of the week if you don't hear from them to determine next steps.  - Let us know if you don't hear about an allergy referral appointment in the next few weeks.   We are checking some labs today, we will release these results to your MyChart.  Take care and seek immediate care sooner if you develop any concerns.   Dr. Ky Barban

## 2021-01-28 NOTE — Assessment & Plan Note (Signed)
No longer on femara. Doing well on once daily metformin, will increase to BID once tolerating. Recommend trial of switching to XR formulation if has GI side effects with BID dosing. F/u in 3 months.

## 2021-01-28 NOTE — Assessment & Plan Note (Signed)
S/p thrombectomy. Doing well on xarelto. Awaiting f/u with vasc surg. Has appt with heme.

## 2021-01-28 NOTE — Progress Notes (Signed)
    SUBJECTIVE:   CHIEF COMPLAINT / HPI:   Patient Active Problem List   Diagnosis Date Noted  . Adverse reaction to COVID-19 vaccine 01/28/2021  . Pulmonary embolism ([place]) 01/21/2021  . Shortness of breath 01/20/2021  . Acute pulmonary embolus ([place]) 01/20/2021  . DVT (deep venous thrombosis) ([place]) 01/20/2021  . Acute pulmonary embolism ([place]) 01/20/2021  . Infertility, female 11/26/2020  . PCOS (polycystic ovarian syndrome) 11/26/2020  . Intramural and submucous leiomyoma of uterus 08/31/2019  . Menorrhagia with irregular cycle 08/31/2019  . Dysmenorrhea 08/31/2019  . B12 deficiency 08/09/2019  . Iron deficiency anemia due to chronic blood loss 06/22/2019  . Acute deep vein thrombosis (DVT) of calf muscle vein of right lower extremity ([place]) 04/18/2019  . Factor V Leiden mutation ([place])   . Allergic rhinitis 03/01/2019  . Kidney stone 08/02/2018  . Asthma, mild intermittent 08/02/2018  . Migraine headache with aura 08/02/2018  . Morbid obesity ([place]) 08/02/2018   HOSP FOLLOW UP Time since discharge: 3 days Hospital/facility: [facility] Diagnosis: b/l submassive PE Procedures/tests:  - CTA chest - b/l submassive PE with some concern for R heart strain - LE doppler US - L LE DVT extending through popliteal, post tib, peroneal veins. - s/p thrombolysis, thrombectomy for PE on 3/1. - txed with IV heparin Consultants: vasc surg, hematology New medications:  - xarelto, metformin - d/c lovenox, letrozole Discharge instructions:   - f/u with heme - has f/u June 17th. - f/u with vasc surg - doesn't currently have f/u due to insurance issues. Awaiting call back on next steps. Status: stable  - taking metformin daily to avoid GI side effects, planning to increase to twice daily once she makes sure she tolerates. - still getting winded some with walking uphill.   Adverse reaction to COVID vaccine - after 2nd dose of Pfizer vaccine, had lip tingling, hives, felt throat closing up some but  never had difficulty breathing. Discussed with pharmacist who recommended component testing.   OBJECTIVE:   BP 124/90   Pulse 88   Temp 98.7 F (37.1 C) (Oral)   Resp 16   Ht 5\' 8"  (1.727 m)   Wt (!) 321 lb 12.8 oz (146 kg)   LMP 01/17/2021   SpO2 97%   BMI 48.93 kg/m   Gen: well appearing, in NAD Card: RRR Lungs: CTAB, comfortable WOB on RA Ext: WWP  ASSESSMENT/PLAN:   Acute pulmonary embolus ([place]) Doing well on Xarelto, plan to continue with starter dose and switch to 20mg  daily 3/31. Has f/u with heme. Awaiting f/u with vasc surg.  Acute deep vein thrombosis (DVT) of calf muscle vein of right lower extremity S/p thrombectomy. Doing well on xarelto. Awaiting f/u with vasc surg. Has appt with heme.   PCOS (polycystic ovarian syndrome) No longer on femara. Doing well on once daily metformin, will increase to BID once tolerating. Recommend trial of switching to XR formulation if has GI side effects with BID dosing. F/u in 3 months.  Adverse reaction to COVID-19 vaccine S/p 2 doses of Pfizer. Referral sent to allergist for component testing per pharmacy recs.    Myles Gip, DO

## 2021-01-29 ENCOUNTER — Other Ambulatory Visit: Payer: Self-pay | Admitting: Family Medicine

## 2021-01-29 ENCOUNTER — Telehealth: Payer: Self-pay

## 2021-01-29 DIAGNOSIS — T50B95A Adverse effect of other viral vaccines, initial encounter: Secondary | ICD-10-CM

## 2021-01-29 NOTE — Telephone Encounter (Signed)
Can you confirm that patient can still see Dr. Jacinto Reap?

## 2021-01-29 NOTE — Telephone Encounter (Signed)
She is fine with that

## 2021-01-29 NOTE — Telephone Encounter (Signed)
Copied from [place] [id number]. Topic: Referral - Status >> Jan 29, 2021 11:03 AM Lennox Solders wrote: Reason for CRM: Jeani Hawking with [facility] is calling and they do not do allergy test for reaction to covid 19 vaccine . The [facility] must [facility] to [facility]

## 2021-01-29 NOTE — Telephone Encounter (Signed)
Per conversation with Tonita Phoenix. And Brandi H. Although the plan states Dr. Rogue Bussing is out of network,  all claims will be processed as in network.  Ok to proceed with scheduling per authorization.

## 2021-01-29 NOTE — Telephone Encounter (Signed)
Referral placed.

## 2021-01-29 NOTE — Telephone Encounter (Signed)
Please let patient know below. I can send a referral to a [facility] (perhaps [facility] since she may be following there for heme) to see if they do testing? If she is amenable, let me know and I can place referral.

## 2021-02-03 ENCOUNTER — Other Ambulatory Visit (INDEPENDENT_AMBULATORY_CARE_PROVIDER_SITE_OTHER): Payer: Self-pay | Admitting: Nurse Practitioner

## 2021-02-06 ENCOUNTER — Other Ambulatory Visit: Payer: Self-pay | Admitting: *Deleted

## 2021-02-06 DIAGNOSIS — I82461 Acute embolism and thrombosis of right calf muscular vein: Secondary | ICD-10-CM

## 2021-02-10 ENCOUNTER — Inpatient Hospital Stay: Payer: 59 | Attending: Internal Medicine

## 2021-02-10 ENCOUNTER — Ambulatory Visit (INDEPENDENT_AMBULATORY_CARE_PROVIDER_SITE_OTHER): Payer: Self-pay | Admitting: Vascular Surgery

## 2021-02-10 ENCOUNTER — Other Ambulatory Visit: Payer: Self-pay | Admitting: Internal Medicine

## 2021-02-10 ENCOUNTER — Other Ambulatory Visit: Payer: Self-pay

## 2021-02-10 ENCOUNTER — Encounter: Payer: Self-pay | Admitting: Internal Medicine

## 2021-02-10 ENCOUNTER — Inpatient Hospital Stay (HOSPITAL_BASED_OUTPATIENT_CLINIC_OR_DEPARTMENT_OTHER): Payer: 59 | Admitting: Internal Medicine

## 2021-02-10 ENCOUNTER — Encounter (INDEPENDENT_AMBULATORY_CARE_PROVIDER_SITE_OTHER): Payer: Self-pay

## 2021-02-10 DIAGNOSIS — N920 Excessive and frequent menstruation with regular cycle: Secondary | ICD-10-CM | POA: Insufficient documentation

## 2021-02-10 DIAGNOSIS — I82461 Acute embolism and thrombosis of right calf muscular vein: Secondary | ICD-10-CM

## 2021-02-10 DIAGNOSIS — Z7901 Long term (current) use of anticoagulants: Secondary | ICD-10-CM | POA: Insufficient documentation

## 2021-02-10 DIAGNOSIS — D500 Iron deficiency anemia secondary to blood loss (chronic): Secondary | ICD-10-CM | POA: Diagnosis not present

## 2021-02-10 DIAGNOSIS — D6851 Activated protein C resistance: Secondary | ICD-10-CM | POA: Insufficient documentation

## 2021-02-10 DIAGNOSIS — Z86718 Personal history of other venous thrombosis and embolism: Secondary | ICD-10-CM | POA: Diagnosis not present

## 2021-02-10 LAB — CBC WITH DIFFERENTIAL/PLATELET
Abs Immature Granulocytes: 0.02 10*3/uL (ref 0.00–0.07)
Basophils Absolute: 0 10*3/uL (ref 0.0–0.1)
Basophils Relative: 1 %
Eosinophils Absolute: 0.2 10*3/uL (ref 0.0–0.5)
Eosinophils Relative: 4 %
HCT: 36.7 % (ref 36.0–46.0)
Hemoglobin: 11.3 g/dL — ABNORMAL LOW (ref 12.0–15.0)
Immature Granulocytes: 0 %
Lymphocytes Relative: 38 %
Lymphs Abs: 2.1 10*3/uL (ref 0.7–4.0)
MCH: 25.7 pg — ABNORMAL LOW (ref 26.0–34.0)
MCHC: 30.8 g/dL (ref 30.0–36.0)
MCV: 83.6 fL (ref 80.0–100.0)
Monocytes Absolute: 0.4 10*3/uL (ref 0.1–1.0)
Monocytes Relative: 7 %
Neutro Abs: 2.8 10*3/uL (ref 1.7–7.7)
Neutrophils Relative %: 50 %
Platelets: 275 10*3/uL (ref 150–400)
RBC: 4.39 MIL/uL (ref 3.87–5.11)
RDW: 13.6 % (ref 11.5–15.5)
WBC: 5.5 10*3/uL (ref 4.0–10.5)
nRBC: 0 % (ref 0.0–0.2)

## 2021-02-10 LAB — COMPREHENSIVE METABOLIC PANEL
ALT: 13 U/L (ref 0–44)
AST: 14 U/L — ABNORMAL LOW (ref 15–41)
Albumin: 3.7 g/dL (ref 3.5–5.0)
Alkaline Phosphatase: 51 U/L (ref 38–126)
Anion gap: 8 (ref 5–15)
BUN: 11 mg/dL (ref 6–20)
CO2: 25 mmol/L (ref 22–32)
Calcium: 8.9 mg/dL (ref 8.9–10.3)
Chloride: 106 mmol/L (ref 98–111)
Creatinine, Ser: 0.76 mg/dL (ref 0.44–1.00)
GFR, Estimated: >60 mL/min (ref >60)
Glucose, Bld: 109 mg/dL — ABNORMAL HIGH (ref 70–99)
Potassium: 4.1 mmol/L (ref 3.5–5.1)
Sodium: 139 mmol/L (ref 135–145)
Total Bilirubin: 0.3 mg/dL (ref 0.3–1.2)
Total Protein: 7 g/dL (ref 6.5–8.1)

## 2021-02-10 MED ORDER — RIVAROXABAN 20 MG PO TABS: 20.0000 mg | ORAL_TABLET | Freq: Every day | ORAL | 1 refills | Status: DC

## 2021-02-10 NOTE — Progress Notes (Signed)
Was told an [place] was going to be after she got out of the hospital on L leg. She has not heard anything about the [place] and wanted to ask you about that.

## 2021-02-10 NOTE — Assessment & Plan Note (Addendum)
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  Compliance].  Patient s/p pulmonary thrombectomy [Dr.Schneir].  Discussed with the patient that she will need indefinite anticoagulation.  Will discuss with Dr. Delana Meyer regarding need for repeat ultrasound.  #Patient currently on Xarelto.  Tolerating well.  New prescription given./Preauthorization needed.  #Pregnancy consideration: Patient is currently taking Femara given the risk of blood clots.  Given her age/risk of blood clots/anticoagulation consideration-patient not interested in conceiving/pregnancy.  #Iron deficient anemia-secondary heavy menstrual periods/fibroids; 9.4; on PO vitron C; hold off IV iron.   # DISPOSITION:  # follow up in 6 months; MD- labs-bmp-Dr.B  Cc; Uvaldo Rising

## 2021-02-10 NOTE — Progress Notes (Signed)
[place] NOTE  Patient Care Team: Ladora as PCP - General (Family Medicine)  CHIEF COMPLAINTS/PURPOSE OF CONSULTATION: DVT/PE  # provoked DVT in 2011 [lesser saphenous vein left lower extremity/below calf; BCP/long car ride x 6 months anticoagulation]; Apr 07, 2019-unprovoked right lower extremity DVT nonocclusive-Xarelto x67m; October 2020-factor V Leiden heterozygous; Xarelto 10 mg/day.  Mar 23, 2020-recommend Lovenox 120 mg twice daily [plans to get pregnant but ? compliance]; FEB 28th, 2022-bilateral PE status post thrombectomy [Dr.Schneir]; left lower extremity DVT-stop Lovenox; switch over to Xarelto.   #Hypercoagulable state-2011?  Factor V Leiden heterozygosity [as per patient; [facility]]-no records  #June 2020-anemia likely iron deficiency/menstrual periods.  Oncology History   No history exists.    HISTORY OF PRESENTING ILLNESS:  Tara Shea 40 y.o.  female history of recurrent DVT right lower extremity; factor V Leiden heterozygous is here for follow-up.    Patient was admitted to hospital recently for [place] lower extremity DVT.  Patient of note had been on Lovenox 1 mg/kg dose twice a day.  However patient admits to missing the evening dose because of work schedule intermittently.  Patient had a thrombectomy; treated with IV heparin.  Patient is currently discharged home on Xarelto.  Patient needs refill Xarelto.  Of note patient was started on Femara for ovulation.  However taken off Femara given the risk of blood clots.  Patient's breathing is improved.  No blood in stools or black or stools.  Review of Systems  Constitutional: Positive for malaise/fatigue. Negative for chills, diaphoresis, fever and weight loss.  HENT: Negative for nosebleeds and sore throat.   Eyes: Negative for double vision.  Respiratory: Negative for cough, hemoptysis, sputum production, shortness of breath and wheezing.   Cardiovascular:  Negative for chest pain, palpitations, orthopnea and leg swelling.  Gastrointestinal: Negative for abdominal pain, blood in stool, constipation, diarrhea, heartburn, melena, nausea and vomiting.  Genitourinary: Negative for dysuria, frequency and urgency.  Musculoskeletal: Negative for back pain and joint pain.  Skin: Negative.  Negative for itching and rash.  Neurological: Negative for dizziness, tingling, focal weakness, weakness and headaches.  Endo/Heme/Allergies: Does not bruise/bleed easily.  Psychiatric/Behavioral: Negative for depression. The patient is not nervous/anxious and does not have insomnia.      MEDICAL HISTORY:  Past Medical History:  Diagnosis Date   Asthma    Bone spur    Cholelithiasis 12/28/9561   Complication of anesthesia    pt had hypotensive episode and has had n & v   DVT (deep venous thrombosis) ([place]) 03/2019   x 1 and DEC 2011   Factor 5 Leiden mutation, heterozygous ([place])    stopped Xarelto 08/23/19   Fibroid    History of kidney stones    IDA (iron deficiency anemia)    vitamin b12 and iron deficiency   Migraines    PCOS (polycystic ovarian syndrome)    PCOS (polycystic ovarian syndrome)     SURGICAL HISTORY: Past Surgical History:  Procedure Laterality Date   CHOLECYSTECTOMY N/A 07/28/2019   Procedure: LAPAROSCOPIC CHOLECYSTECTOMY;  Surgeon: Jules Husbands, MD;  Location: [facility];  Service: General;  Laterality: N/A;   HYSTEROSCOPY WITH D & C N/A 08/31/2019   Procedure: DILATATION AND CURETTAGE /HYSTEROSCOPY/ SUBMUCOSAL MYOMECTOMY;  Surgeon: Will Bonnet, MD;  Location: [facility];  Service: Gynecology;  Laterality: N/A;   HYSTEROSCOPY WITH D & C N/A 10/03/2019   Procedure: DILATATION AND CURETTAGE /HYSTEROSCOPY/ SUBMUCOSAL MYOMECTOMY;  Surgeon: Will Bonnet, MD;  Location: [facility];  Service: Gynecology;  Laterality: N/A;   HYSTEROSCOPY WITH D & C N/A 10/26/2019   Procedure: DILATATION AND CURETTAGE /HYSTEROSCOPY,  SUBMUCOSAL MYOMECTOMY;  Surgeon: Will Bonnet, MD;  Location: [facility];  Service: Gynecology;  Laterality: N/A;   MYOMECTOMY     PULMONARY THROMBECTOMY N/A 01/21/2021   Procedure: PULMONARY THROMBECTOMY;  Surgeon: Katha Cabal, MD;  Location: [facility];  Service: Cardiovascular;  Laterality: N/A;   TONSILLECTOMY     WISDOM TOOTH EXTRACTION     WRIST SURGERY     age 47 - rebroke it b/c it grew back wrong    SOCIAL HISTORY: Social History   Socioeconomic History   Marital status: Married    Spouse name: adam   Number of children: 0   Years of education: 16   Highest education level: Not on file  Occupational History   Occupation: Programmer, multimedia: [employer]    Comment: ED    (works at [employer])  Tobacco Use   Smoking status: Never Smoker   Smokeless tobacco: Never Used  Scientific laboratory technician Use: Never used  Substance and Sexual Activity   Alcohol use: Yes    Comment: monthly   Drug use: No   Sexual activity: Yes  Other Topics Concern   Not on file  Social History Narrative    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  No children.       Social Determinants of Health   Financial Resource Strain: Not on file  Food Insecurity: Not on file  Transportation Needs: Not on file  Physical Activity: Not on file  Stress: Not on file  Social Connections: Not on file  Intimate Partner Violence: Not on file    FAMILY HISTORY: Family History  Problem Relation Age of Onset   Heart murmur Mother    Thyroid disease Mother    Migraines Mother    Asthma Mother    Diabetes Mother        TYPE 2   Bipolar disorder Mother    Breast cancer Maternal Aunt 64   Cancer Maternal Uncle        skin   Cancer Maternal Grandmother 80       colon   Hypertension Maternal Grandmother    Cancer Maternal Aunt 50       colon   Diabetes Maternal Aunt     ALLERGIES:  is allergic to covid-19 (mrna) vaccine.  MEDICATIONS:  Current  Outpatient Medications  Medication Sig Dispense Refill   albuterol (PROVENTIL) (2.5 MG/3ML) 0.083% nebulizer solution Take 3 mLs (2.5 mg total) by nebulization every 6 (six) hours as needed for wheezing or shortness of breath. 75 mL 12   albuterol (VENTOLIN HFA) 108 (90 Base) MCG/ACT inhaler Inhale 2 puffs into the lungs every 4 (four) hours as needed for wheezing or shortness of breath. 18 g 3   ferrous sulfate 325 (65 FE) MG tablet Take 325 mg by mouth daily.     fluticasone (FLOVENT HFA) 220 MCG/ACT inhaler Inhale 1 puff into the lungs daily.     FOLIC ACID PO Take by mouth.     metFORMIN (GLUCOPHAGE) 500 MG tablet Take 1 tablet (500 mg total) by mouth 2 (two) times daily with a meal. 60 tablet 3   Multiple Vitamins-Minerals (MULTIVITAMIN WITH MINERALS) tablet Take 1 tablet by mouth daily.      ondansetron (ZOFRAN ODT) 4 MG disintegrating tablet Take 1 tablet (  4 mg total) by mouth every 6 (six) hours as needed for nausea. 60 tablet 1   promethazine (PHENERGAN) 25 MG tablet Take 1 tablet (25 mg total) by mouth every 8 (eight) hours as needed for nausea or vomiting. 60 tablet 1   RIVAROXABAN (XARELTO) VTE STARTER PACK (15 & 20 MG) Follow package directions: Take one 15mg  tablet by mouth twice a day. On day 22, switch to one 20mg  tablet once a day. Take with food. 51 each 0   Spacer/Aero-Holding Chambers (AEROCHAMBER PLUS) inhaler Use as instructed 1 each 2   vitamin B-12 (CYANOCOBALAMIN) 1000 MCG tablet Take 1 tablet (1,000 mcg total) by mouth daily. 30 tablet 0   vitamin C (ASCORBIC ACID) 500 MG tablet Take 500 mg by mouth daily.     [START ON 02/20/2021] rivaroxaban (XARELTO) 20 MG TABS tablet Take 1 tablet (20 mg total) by mouth daily with supper. Start after you finish the starter pack 90 tablet 1   No current facility-administered medications for this visit.      Marland Kitchen  PHYSICAL EXAMINATION:  Vitals:   02/10/21 1458  BP: 102/90  Pulse: 88  Resp: 16  Temp: (!) 96.1 F  (35.6 C)  SpO2: 99%   Filed Weights   02/10/21 1458  Weight: (!) 320 lb 6.4 oz (145.3 kg)    Physical Exam Constitutional:      Comments: Obese.  HENT:     Head: Normocephalic and atraumatic.     Mouth/Throat:     Pharynx: No oropharyngeal exudate.  Eyes:     Pupils: Pupils are equal, round, and reactive to light.  Cardiovascular:     Rate and Rhythm: Normal rate and regular rhythm.  Pulmonary:     Effort: Pulmonary effort is normal. No respiratory distress.     Breath sounds: Normal breath sounds. No wheezing.  Abdominal:     General: Bowel sounds are normal. There is no distension.     Palpations: Abdomen is soft. There is no mass.     Tenderness: There is no abdominal tenderness. There is no guarding or rebound.  Musculoskeletal:        General: No tenderness. Normal range of motion.     Cervical back: Normal range of motion and neck supple.  Skin:    General: Skin is warm.  Neurological:     Mental Status: She is alert and oriented to person, place, and time.  Psychiatric:        Mood and Affect: Affect normal.     LABORATORY DATA:  I have reviewed the data as listed Lab Results  Component Value Date   WBC 5.5 02/10/2021   HGB 11.3 (L) 02/10/2021   HCT 36.7 02/10/2021   MCV 83.6 02/10/2021   PLT 275 02/10/2021   Recent Labs    01/21/21 0315 01/22/21 0548 02/10/21 1425  NA 137 139 139  K 3.7 3.8 4.1  CL 106 106 106  CO2 23 23 25   GLUCOSE 103* 102* 109*  BUN 13 14 11   CREATININE 0.64 0.67 0.76  CALCIUM 9.1 8.9 8.9  GFRNONAA >60 >60 >60  PROT  --  6.5 7.0  ALBUMIN  --  3.6 3.7  AST  --  13* 14*  ALT  --  11 13  ALKPHOS  --  56 51  BILITOT  --  0.5 0.3    RADIOGRAPHIC STUDIES: I have personally reviewed the radiological images as listed and agreed with the findings in the report. DG Chest  2 View  Result Date: 01/20/2021 CLINICAL DATA:  Dyspnea on exertion. EXAM: CHEST - 2 VIEW COMPARISON:  None. FINDINGS: The heart size and mediastinal  contours are within normal limits. Both lungs are clear. The visualized skeletal structures are unremarkable. IMPRESSION: No active cardiopulmonary disease. Electronically Signed   By: Franchot Gallo M.D.   On: 01/20/2021 13:29   CT Angio Chest PE W and/or Wo Contrast  Result Date: 01/20/2021 CLINICAL DATA:  Evaluate for acute pulmonary embolus.  Low EXAM: CT ANGIOGRAPHY CHEST WITH CONTRAST TECHNIQUE: Multidetector CT imaging of the chest was performed using the standard protocol during bolus administration of intravenous contrast. Multiplanar CT image reconstructions and MIPs were obtained to evaluate the vascular anatomy. CONTRAST:  25mL OMNIPAQUE IOHEXOL 350 MG/ML SOLN COMPARISON:  Chest x-ray from earlier today FINDINGS: Cardiovascular: Examination is positive for acute pulmonary embolus. Large bilateral filling defects are noted within the distal right mainstem artery as well as the right lower lobar and segmental pulmonary arteries clot is also identified within the left lower lobe are and left lower segmental and subsegmental pulmonary arteries. Positive for acute PE with CTevidence of right heart strain (RV/LV Ratio = 1.04) consistent with at least submassive (intermediate risk) PE. The presence of right heart strain has been associated with an increased risk of morbidity and mortality. No pericardial effusion identified. Mediastinum/Nodes: No enlarged mediastinal, hilar, or axillary lymph nodes. Thyroid gland, trachea, and esophagus demonstrate no significant findings. Lungs/Pleura: Lungs are clear. No pleural effusion or pneumothorax. Upper Abdomen: No acute abnormality.  Previous cholecystectomy. Musculoskeletal: No chest wall abnormality. No acute or significant osseous findings. Review of the MIP images confirms the above findings. IMPRESSION: Examination is positive for acute pulmonary embolus with CTevidence of right heart strain (RV/LV Ratio 1.04) consistent with at least submassive (intermediate  risk) PE. The presence of right heart strain has been associated with an increased risk of morbidity and mortality. Critical Value/emergent results were called by telephone at the time of interpretation on 01/20/2021 at 2:05 pm to provider [facility] , who verbally acknowledged these results. Electronically Signed   By: Kerby Moors M.D.   On: 01/20/2021 14:06   PERIPHERAL VASCULAR CATHETERIZATION  Result Date: 01/21/2021 See op note  US Venous Img Lower Bilateral (DVT)  Result Date: 01/20/2021 CLINICAL DATA:  Acute PE, shortness of breath. EXAM: BILATERAL LOWER EXTREMITY VENOUS DOPPLER ULTRASOUND TECHNIQUE: Gray-scale sonography with graded compression, as well as color Doppler and duplex ultrasound were performed to evaluate the lower extremity deep venous systems from the level of the common femoral vein and including the common femoral, femoral, profunda femoral, popliteal and calf veins including the posterior tibial, peroneal and gastrocnemius veins when visible. The superficial great saphenous vein was also interrogated. Spectral Doppler was utilized to evaluate flow at rest and with distal augmentation maneuvers in the common femoral, femoral and popliteal veins. COMPARISON:  November 15, 2019. FINDINGS: RIGHT LOWER EXTREMITY Common Femoral Vein: No evidence of thrombus. Normal compressibility, respiratory phasicity and response to augmentation. Saphenofemoral Junction: No evidence of thrombus. Normal compressibility and flow on color Doppler imaging. Profunda Femoral Vein: No evidence of thrombus. Normal compressibility and flow on color Doppler imaging. Femoral Vein: No evidence of thrombus. Normal compressibility, respiratory phasicity and response to augmentation. Popliteal Vein: No evidence of thrombus. Normal compressibility, respiratory phasicity and response to augmentation. Calf Veins: No evidence of thrombus. Normal compressibility and flow on color Doppler imaging. Venous Reflux:  None.  LEFT LOWER EXTREMITY Common Femoral Vein: No evidence  of thrombus. Normal compressibility, respiratory phasicity and response to augmentation. Saphenofemoral Junction: No evidence of thrombus. Normal compressibility and flow on color Doppler imaging. Profunda Femoral Vein: No evidence of thrombus. Normal compressibility and flow on color Doppler imaging. Femoral Vein: No evidence of thrombus. Normal compressibility, respiratory phasicity and response to augmentation. Popliteal Vein: Nonocclusive thrombus.  Diminished compressibility. Calf Veins: Occlusive thrombus involving the posterior tibial vein and peroneal vein. Diminished compressibility. Venous Reflux:  None. IMPRESSION: Evidence of deep venous thrombosis involving the left popliteal vein, posterior tibial vein, and peroneal veins. These results will be called to the ordering clinician or representative by the Radiologist Assistant, and communication documented in the PACS or [employer]. Electronically Signed   By: Margaretha Sheffield MD   On: 01/20/2021 17:09   ECHOCARDIOGRAM COMPLETE  Result Date: 01/21/2021    ECHOCARDIOGRAM REPORT   Patient Name:   Tara Shea Date of Exam: 01/20/2021 Medical Rec #:  [id number]            Height:       68.0 in Accession #:    [id number]           Weight:       310.0 lb Date of Birth:  1981-09-13           BSA:          2.462 m Patient Age:    31 years             BP:           126/89 mmHg Patient Gender: F                    HR:           74 bpm. Exam Location:  [facility] Procedure: 2D Echo, Cardiac Doppler and Color Doppler Indications:     Pulmonary Embolus I26.09  History:         Patient has no prior history of Echocardiogram examinations.  Sonographer:     Alyse Low Roar Referring Phys:  Unknown Foley NIU Diagnosing Phys: Nelva Bush MD IMPRESSIONS  1. Left ventricular ejection fraction, by estimation, is 55 to 60%. The left ventricle has normal function. Left ventricular endocardial border not optimally  defined to evaluate regional wall motion. Left ventricular diastolic parameters were normal.  2. Pulmonary artery pressure is at least upper normal to mildly elevated (PASP 30 mmHg plus central venous pressure). Right ventricular systolic function is normal. The right ventricular size is normal.  3. The mitral valve is normal in structure. Trivial mitral valve regurgitation. No evidence of mitral stenosis.  4. The aortic valve was not well visualized. Aortic valve regurgitation is not visualized. No aortic stenosis is present. FINDINGS  Left Ventricle: Left ventricular ejection fraction, by estimation, is 55 to 60%. The left ventricle has normal function. Left ventricular endocardial border not optimally defined to evaluate regional wall motion. The left ventricular internal cavity size was normal in size. There is borderline left ventricular hypertrophy. Left ventricular diastolic parameters were normal. Right Ventricle: Pulmonary artery pressure is at least upper normal to mildly elevated (PASP 30 mmHg plus central venous pressure). The right ventricular size is normal. No increase in right ventricular wall thickness. Right ventricular systolic function  is normal. Left Atrium: Left atrial size was normal in size. Right Atrium: Right atrial size was normal in size. Pericardium: Trivial pericardial effusion is present. Mitral Valve: The mitral valve is normal in structure. Trivial mitral valve regurgitation. No  evidence of mitral valve stenosis. Tricuspid Valve: The tricuspid valve is normal in structure. Tricuspid valve regurgitation is mild. Aortic Valve: The aortic valve was not well visualized. Aortic valve regurgitation is not visualized. No aortic stenosis is present. Aortic valve peak gradient measures 3.5 mmHg. Pulmonic Valve: The pulmonic valve was grossly normal. Pulmonic valve regurgitation is trivial. No evidence of pulmonic stenosis. Aorta: The aortic root is normal in size and structure. Pulmonary  Artery: The pulmonary artery is not well seen. Venous: The inferior vena cava was not well visualized. IAS/Shunts: The interatrial septum was not well visualized.  LEFT VENTRICLE PLAX 2D LVIDd:         4.80 cm  Diastology LVIDs:         3.50 cm  LV e' medial:    11.40 cm/s LV PW:         1.00 cm  LV E/e' medial:  7.4 LV IVS:        1.00 cm  LV e' lateral:   12.90 cm/s LVOT diam:     2.00 cm  LV E/e' lateral: 6.5 LVOT Area:     3.14 cm  RIGHT VENTRICLE RV Mid diam:    3.00 cm RV S prime:     14.30 cm/s TAPSE (M-mode): 1.9 cm LEFT ATRIUM             Index       RIGHT ATRIUM           Index LA diam:        3.90 cm 1.58 cm/m  RA Area:     11.10 cm LA Vol (A2C):   48.1 ml 19.54 ml/m RA Volume:   20.10 ml  8.16 ml/m LA Vol (A4C):   41.2 ml 16.73 ml/m LA Biplane Vol: 46.9 ml 19.05 ml/m  AORTIC VALVE               PULMONIC VALVE AV Area (Vmax): 2.63 cm   PV Vmax:        0.98 m/s AV Vmax:        93.70 cm/s PV Peak grad:   3.8 mmHg AV Peak Grad:   3.5 mmHg   RVOT Peak grad: 1 mmHg LVOT Vmax:      78.40 cm/s  AORTA Ao Root diam: 2.80 cm MITRAL VALVE               TRICUSPID VALVE MV Area (PHT): 5.13 cm    TR Peak grad:   29.8 mmHg MV Decel Time: 148 msec    TR Vmax:        273.00 cm/s MV E velocity: 84.40 cm/s MV A velocity: 56.60 cm/s  SHUNTS MV E/A ratio:  1.49        Systemic Diam: 2.00 cm MV A Prime:    9.1 cm/s Harrell Gave End MD Electronically signed by Nelva Bush MD Signature Date/Time: 01/21/2021/7:23:19 AM    Final     ASSESSMENT & PLAN:   Acute deep vein thrombosis (DVT) of calf muscle vein of right lower extremity #Heterozygous factor V Leiden with recurrent left lower extremity DVT [unprovoked; May 2020]; February 2022/also bilateral PE right heart strain on imaging CT [while on Lovenox 1 mg/kg- ?  Compliance].  Patient s/p pulmonary thrombectomy [Dr.Schneir].  Discussed with the patient that she will need indefinite anticoagulation.  Will discuss with Dr. Delana Meyer regarding need for repeat  ultrasound.  #Patient currently on Xarelto.  Tolerating well.  New prescription given./Preauthorization needed.  #Pregnancy consideration: Patient  is currently taking Femara given the risk of blood clots.  Given her age/risk of blood clots/anticoagulation consideration-patient not interested in conceiving/pregnancy.  #Iron deficient anemia-secondary heavy menstrual periods/fibroids; 9.4; on PO vitron C; hold off IV iron.   # DISPOSITION:  # follow up in 6 months; MD- labs-bmp-Dr.B  Cc; Uvaldo Rising  All questions were answered. The patient knows to call the clinic with any problems, questions or concerns.     Cammie Sickle, MD 02/10/2021 5:17 PM

## 2021-02-11 ENCOUNTER — Encounter: Payer: Self-pay | Admitting: Internal Medicine

## 2021-02-11 ENCOUNTER — Telehealth: Payer: Self-pay

## 2021-02-11 ENCOUNTER — Telehealth: Payer: Self-pay | Admitting: Internal Medicine

## 2021-02-11 ENCOUNTER — Telehealth: Payer: Self-pay | Admitting: Pharmacist

## 2021-02-11 ENCOUNTER — Other Ambulatory Visit: Payer: Self-pay

## 2021-02-11 DIAGNOSIS — I82462 Acute embolism and thrombosis of left calf muscular vein: Secondary | ICD-10-CM

## 2021-02-11 DIAGNOSIS — I82461 Acute embolism and thrombosis of right calf muscular vein: Secondary | ICD-10-CM

## 2021-02-11 MED ORDER — RIVAROXABAN 20 MG PO TABS: 20.0000 mg | ORAL_TABLET | Freq: Every day | ORAL | 12 refills | Status: DC

## 2021-02-11 NOTE — Telephone Encounter (Signed)
On 3/21-discussed with Dr. Katrinka Blazing repeat ultrasound in 6 to 8 weeks. Will order.  C- please schedule dopplers in appx 4 weeks from now.   Thanks GB

## 2021-02-11 NOTE — Telephone Encounter (Signed)
PA for xarelto 20 mg is approved. It was only approved for 30 day supply at a time. New rx sent for 30 day supply with 12 RFs. Case # [id number] approved from 02/09/21-02/11/22. Sent pt a mychart message making her aware.

## 2021-02-11 NOTE — Telephone Encounter (Signed)
Oral Chemotherapy Pharmacist Encounter  Received message that patient was in need of Xarelto samples while her PA was pending.   Dispensed samples to patient:  Medication: Xarelto 20mg  Instructions: Take 1 tablet (20 mg total) by mouth daily with supper. Quantity dispensed: 14 tablets (2 bottles) Days supply: 14 Manufacturer: [employer] Lot: [id number] Exp: 07/23/2022  Darl Pikes, PharmD, BCPS, BCOP, CPP Hematology/Oncology Clinical Pharmacist [facility] [phone]  02/11/2021 2:33 PM

## 2021-03-10 ENCOUNTER — Other Ambulatory Visit: Payer: Self-pay

## 2021-03-10 MED FILL — Rivaroxaban Tab 20 MG: ORAL | 30 days supply | Qty: 30 | Fill #0 | Status: AC

## 2021-03-10 MED FILL — Metformin HCl Tab 500 MG: ORAL | 30 days supply | Qty: 60 | Fill #0 | Status: AC

## 2021-03-11 ENCOUNTER — Other Ambulatory Visit: Payer: Self-pay | Admitting: Internal Medicine

## 2021-03-11 ENCOUNTER — Ambulatory Visit
Admission: RE | Admit: 2021-03-11 | Discharge: 2021-03-11 | Disposition: A | Discharge status: Home / Self Care | Payer: 59 | Source: Ambulatory Visit | Attending: Internal Medicine | Admitting: Internal Medicine

## 2021-03-11 ENCOUNTER — Other Ambulatory Visit: Payer: Self-pay

## 2021-03-11 ENCOUNTER — Encounter: Payer: 59 | Admitting: Obstetrics and Gynecology

## 2021-03-11 DIAGNOSIS — M79605 Pain in left leg: Secondary | ICD-10-CM | POA: Diagnosis present

## 2021-03-11 DIAGNOSIS — I82462 Acute embolism and thrombosis of left calf muscular vein: Secondary | ICD-10-CM

## 2021-03-12 ENCOUNTER — Encounter: Payer: Self-pay | Admitting: Internal Medicine

## 2021-04-16 ENCOUNTER — Other Ambulatory Visit: Payer: Self-pay

## 2021-04-16 MED FILL — Rivaroxaban Tab 20 MG: ORAL | 30 days supply | Qty: 30 | Fill #1 | Status: AC

## 2021-05-21 ENCOUNTER — Other Ambulatory Visit: Payer: Self-pay

## 2021-05-21 MED ORDER — CARESTART COVID-19 HOME TEST VI KIT
PACK | 0 refills | Status: DC
  Filled 2021-05-21: qty 2, 2d supply, fill #0

## 2021-05-21 MED FILL — Rivaroxaban Tab 20 MG: ORAL | 30 days supply | Qty: 30 | Fill #2 | Status: AC

## 2021-05-22 ENCOUNTER — Other Ambulatory Visit: Payer: Self-pay

## 2021-05-22 ENCOUNTER — Telehealth: Payer: 59 | Admitting: Physician Assistant

## 2021-05-22 DIAGNOSIS — J069 Acute upper respiratory infection, unspecified: Secondary | ICD-10-CM

## 2021-05-22 MED ORDER — BENZONATATE 100 MG PO CAPS
100.0000 mg | ORAL_CAPSULE | Freq: Three times a day (TID) | ORAL | 0 refills | Status: DC | PRN
  Filled 2021-05-22: qty 30, 10d supply, fill #0

## 2021-05-22 NOTE — Progress Notes (Signed)
I have spent 5 minutes in review of e-visit questionnaire, review and updating patient chart, medical decision making and response to patient.   Veanna Dower Cody Shaman Muscarella, PA-C    

## 2021-05-22 NOTE — Progress Notes (Signed)
E-Visit for Upper Respiratory Infection   We are sorry you are not feeling well.  Here is how we plan to help!  Based on what you have shared with me, it looks like you may have a viral upper respiratory infection.  Upper respiratory infections are caused by a large number of viruses; however, rhinovirus is the most common cause. Giving your known exposure to COVID and current symptoms, I am concerned about a false-negative COVID test and recommend you be retested with a send out PCR test. This can be done at most pharmacies in the area and can also be done through [facility] -- you can schedule this by going to [email or web address]  Symptoms vary from person to person, with common symptoms including sore throat, cough, fatigue or lack of energy and feeling of general discomfort.  A low-grade fever of up to 100.4 may present, but is often uncommon.  Symptoms vary however, and are closely related to a person's age or underlying illnesses.  The most common symptoms associated with an upper respiratory infection are nasal discharge or congestion, cough, sneezing, headache and pressure in the ears and face.  These symptoms usually persist for about 3 to 10 days, but can last up to 2 weeks.  It is important to know that upper respiratory infections do not cause serious illness or complications in most cases.    Upper respiratory infections can be transmitted from person to person, with the most common method of transmission being a person's hands.  The virus is able to live on the skin and can infect other persons for up to 2 hours after direct contact.  Also, these can be transmitted when someone coughs or sneezes; thus, it is important to cover the mouth to reduce this risk.  To keep the spread of the illness at [place], good hand hygiene is very important.  This is an infection that is most likely caused by a virus. There are no specific treatments other than to help you with the symptoms until  the infection runs its course.  We are sorry you are not feeling well.  Here is how we plan to help!   For nasal congestion, you may use an oral decongestants such as Mucinex D or if you have glaucoma or high blood pressure use plain Mucinex.  Saline nasal spray or nasal drops can help and can safely be used as often as needed for congestion.   If you do not have a history of heart disease, hypertension, diabetes or thyroid disease, prostate/bladder issues or glaucoma, you may also use Sudafed to treat nasal congestion.  It is highly recommended that you consult with a pharmacist or your primary care physician to ensure this medication is safe for you to take.     If you have a cough, you may use cough suppressants such as Delsym and Robitussin.  If you have glaucoma or high blood pressure, you can also use Coricidin HBP.   For cough I have prescribed for you A prescription cough medication called Tessalon Perles 100 mg. You may take 1-2 capsules every 8 hours as needed for cough  If you have a sore or scratchy throat, use a saltwater gargle-  to  teaspoon of salt dissolved in a 4-ounce to 8-ounce glass of warm water.  Gargle the solution for approximately 15-30 seconds and then spit.  It is important not to swallow the solution.  You can also use throat lozenges/cough drops and Chloraseptic spray to help with  throat pain or discomfort.  Warm or cold liquids can also be helpful in relieving throat pain.  For headache, pain or general discomfort, you can use Ibuprofen or Tylenol as directed.   Some authorities believe that zinc sprays or the use of Echinacea may shorten the course of your symptoms.   HOME CARE Only take medications as instructed by your medical team. Be sure to drink plenty of fluids. Water is fine as well as fruit juices, sodas and electrolyte beverages. You may want to stay away from caffeine or alcohol. If you are nauseated, try taking small sips of liquids. How do you know if  you are getting enough fluid? Your urine should be a pale yellow or almost colorless. Get rest. Taking a steamy shower or using a humidifier may help nasal congestion and ease sore throat pain. You can place a towel over your head and breathe in the steam from hot water coming from a faucet. Using a saline nasal spray works much the same way. Cough drops, hard candies and sore throat lozenges may ease your cough. Avoid close contacts especially the very young and the elderly Cover your mouth if you cough or sneeze Always remember to wash your hands.   GET HELP RIGHT AWAY IF: You develop worsening fever. If your symptoms do not improve within 10 days You develop yellow or green discharge from your nose over 3 days. You have coughing fits You develop a severe head ache or visual changes. You develop shortness of breath, difficulty breathing or start having chest pain Your symptoms persist after you have completed your treatment plan  MAKE SURE YOU  Understand these instructions. Will watch your condition. Will get help right away if you are not doing well or get worse.  Thank you for choosing an e-visit.  Your e-visit answers were reviewed by a board certified advanced clinical practitioner to complete your personal care plan. Depending upon the condition, your plan could have included both over the counter or prescription medications.  Please review your pharmacy choice. Make sure the pharmacy is open so you can pick up prescription now. If there is a problem, you may contact your provider through [employer] and have the prescription routed to another pharmacy.  Your safety is important to [place]. If you have drug allergies check your prescription carefully.   For the next 24 hours you can use MyChart to ask questions about today's visit, request a non-urgent call back, or ask for a work or school excuse. You will get an email in the next two days asking about your experience. I hope  that your e-visit has been valuable and will speed your recovery.

## 2021-05-27 ENCOUNTER — Other Ambulatory Visit: Payer: Self-pay

## 2021-05-27 ENCOUNTER — Telehealth: Payer: 59 | Admitting: Family Medicine

## 2021-05-27 ENCOUNTER — Encounter: Payer: Self-pay | Admitting: Family Medicine

## 2021-05-27 VITALS — HR 110 | Temp 100.1°F | Ht 68.0 in | Wt 305.0 lb

## 2021-05-27 DIAGNOSIS — J069 Acute upper respiratory infection, unspecified: Secondary | ICD-10-CM | POA: Diagnosis not present

## 2021-05-27 MED ORDER — HYDROCOD POLST-CPM POLST ER 10-8 MG/5ML PO SUER
5.0000 mL | Freq: Two times a day (BID) | ORAL | 0 refills | Status: DC | PRN
  Filled 2021-05-27: qty 115, 12d supply, fill #0

## 2021-05-27 NOTE — Progress Notes (Signed)
Virtual Visit via Video Note  I connected with Tara Shea on 05/27/21 at  9:40 AM EDT by a video enabled telemedicine application and verified that I am speaking with the correct person using two identifiers.  Location: Patient: home Provider: [facility]   I discussed the limitations of evaluation and management by telemedicine and the availability of in person appointments. The patient expressed understanding and agreed to proceed.  History of Present Illness:  UPPER RESPIRATORY TRACT INFECTION - recently seen virtually for presumed viral URI 6/30. Rx tessalon. COVID PCR negative x3. - symptom onset 05/19/21.  Worst symptom: Fever: no, 100.73F highest this am. Cough: yes Shortness of breath: no Wheezing: no Chest pain: no Chest tightness: no Chest congestion: yes Nasal congestion: yes Runny nose: yes Post nasal drip: yes Sore throat: yes Sinus pressure: no Headache: no Face pain: no Ear pain: no    Ear pressure: no  Eyes red/itching:no Eye drainage/crusting: no  Vomiting:  usually vomits while mensturating, is currently on period Sick contacts:  works in ED Recurrent sinusitis: no Relief with OTC cold/cough medications:  some with chloraseptic spray. Tussonex.   Treatments attempted:  tylenol, tessalon, afrin, sudafed, delsym.    Observations/Objective:  Tired appearing, congested sounding. In NAD.  Assessment and Plan:  Viral URI COVID negative. No current indications for steroids, abx. Recommend continued supportive care. Rx for tussionex provided. Consider abx for sinusitis if symptoms persist >10 days with fever. Emergency and return precautions discussed.     I discussed the assessment and treatment plan with the patient. The patient was provided an opportunity to ask questions and all were answered. The patient agreed with the plan and demonstrated an understanding of the instructions.   The patient was advised to call back or seek an in-person  evaluation if the symptoms worsen or if the condition fails to improve as anticipated.  I provided 11 minutes of non-face-to-face time during this encounter.   Myles Gip, DO

## 2021-05-27 NOTE — Patient Instructions (Signed)
You have a cold and it should start to get better about 7 - 10 days after it started.    For your nasal congestion and runny nose, try using Afrin (generic is Oxymetazoline) twice daily for 3 days.  Do not use for longer that 3 days.    Some other therapies you can try are: push fluids, rest, gargle warm salt water, use vaporizer or mist prn, and return office visit prn if symptoms persist or worsen.   Drinking warm liquids such as teas and soups can help with secretions and cough. A mist humidifier or vaporizer can work well to help with secretions and cough.  It is very important to clean the humidifier between use according to the instructions.    If you're still having trouble in the next week, come back and see [place].    Of course, if you start having trouble breathing, worsening fevers, vomiting and unable to hold down any fluids, or you have other concerns, don't hesitate to come back or go to the ED after hours.

## 2021-06-06 ENCOUNTER — Telehealth: Payer: 59 | Admitting: Physician Assistant

## 2021-06-06 DIAGNOSIS — J018 Other acute sinusitis: Secondary | ICD-10-CM

## 2021-06-06 DIAGNOSIS — Z20822 Contact with and (suspected) exposure to covid-19: Secondary | ICD-10-CM | POA: Diagnosis not present

## 2021-06-06 DIAGNOSIS — J45901 Unspecified asthma with (acute) exacerbation: Secondary | ICD-10-CM

## 2021-06-07 ENCOUNTER — Encounter: Payer: Self-pay | Admitting: Physician Assistant

## 2021-06-07 MED ORDER — PREDNISONE 20 MG PO TABS: 20.0000 mg | ORAL_TABLET | Freq: Every day | ORAL | 0 refills | Status: DC

## 2021-06-07 MED ORDER — AMOXICILLIN-POT CLAVULANATE 875-125 MG PO TABS: 1.0000 | ORAL_TABLET | Freq: Two times a day (BID) | ORAL | 0 refills | Status: DC

## 2021-06-07 MED ORDER — BENZONATATE 100 MG PO CAPS: 100.0000 mg | ORAL_CAPSULE | Freq: Two times a day (BID) | ORAL | 0 refills | Status: DC | PRN

## 2021-06-07 NOTE — Progress Notes (Signed)
.  E-Visit for Sinus Problems and flare of Asthma    We are sorry that you are not feeling well.  Here is how we plan to help!  Based on what you have shared with me it looks like you have sinusitis and an asthma exacerbation. Sinusitis is inflammation and infection in the sinus cavities of the head.  Based on your presentation I believe you most likely have Acute Bacterial Sinusitis.  This is an infection caused by bacteria and is treated with antibiotics. I have prescribed Augmentin 875mg /125mg  one tablet twice daily with food, for 7 days.  I have also prescribed prednisone 20 mg take 2 pills once daily for 5 days. I have re-prescribed Tessalon perles, in case you need it.  It maybe advisable that you should retest-since last Covid 19 test was approx 3 weeks ago.  You may use an oral decongestant such as Mucinex D or if you have glaucoma or high blood pressure use plain Mucinex. Saline nasal spray help and can safely be used as often as needed for congestion.  If you develop worsening sinus pain, fever or notice severe headache and vision changes, or if symptoms are not better after completion of antibiotic, please schedule an appointment with a health care provider.    Sinus infections are not as easily transmitted as other respiratory infection, however we still recommend that you avoid close contact with loved ones, especially the very young and elderly.  Remember to wash your hands thoroughly throughout the day as this is the number one way to prevent the spread of infection!  Home Care: Only take medications as instructed by your medical team. Complete the entire course of an antibiotic. Do not take these medications with alcohol. A steam or ultrasonic humidifier can help congestion.  You can place a towel over your head and breathe in the steam from hot water coming from a faucet. Avoid close contacts especially the very young and the elderly. Cover your mouth when you cough or  sneeze. Always remember to wash your hands.  Get Help Right Away If: You develop worsening fever or sinus pain. You develop a severe head ache or visual changes. Your symptoms persist after you have completed your treatment plan.  Make sure you Understand these instructions. Will watch your condition. Will get help right away if you are not doing well or get worse.  Thank you for choosing an e-visit.  Your e-visit answers were reviewed by a board certified advanced clinical practitioner to complete your personal care plan. Depending upon the condition, your plan could have included both over the counter or prescription medications.  Please review your pharmacy choice. Make sure the pharmacy is open so you can pick up prescription now. If there is a problem, you may contact your provider through [employer] and have the prescription routed to another pharmacy.  Your safety is important to [place]. If you have drug allergies check your prescription carefully.   For the next 24 hours you can use MyChart to ask questions about today's visit, request a non-urgent call back, or ask for a work or school excuse. You will get an email in the next two days asking about your experience. I hope that your e-visit has been valuable and will speed your recovery.I spent 5-10 minutes on review and completion of this note- Lacy Duverney [facility]

## 2021-06-20 ENCOUNTER — Other Ambulatory Visit: Payer: Self-pay

## 2021-06-20 MED FILL — Rivaroxaban Tab 20 MG: ORAL | 30 days supply | Qty: 30 | Fill #3 | Status: CN

## 2021-07-02 ENCOUNTER — Other Ambulatory Visit (HOSPITAL_COMMUNITY): Payer: Self-pay

## 2021-07-08 ENCOUNTER — Encounter: Payer: Self-pay | Admitting: Internal Medicine

## 2021-07-08 MED ORDER — RIVAROXABAN 20 MG PO TABS: ORAL_TABLET | ORAL | 1 refills | Status: DC

## 2021-07-16 ENCOUNTER — Telehealth: Payer: 59 | Admitting: Nurse Practitioner

## 2021-07-16 DIAGNOSIS — U071 COVID-19: Secondary | ICD-10-CM

## 2021-07-16 DIAGNOSIS — J4520 Mild intermittent asthma, uncomplicated: Secondary | ICD-10-CM

## 2021-07-16 DIAGNOSIS — R059 Cough, unspecified: Secondary | ICD-10-CM

## 2021-07-16 MED ORDER — AZITHROMYCIN 250 MG PO TABS: ORAL_TABLET | ORAL | 0 refills | Status: DC

## 2021-07-16 MED ORDER — BENZONATATE 100 MG PO CAPS: 100.0000 mg | ORAL_CAPSULE | Freq: Three times a day (TID) | ORAL | 0 refills | Status: DC | PRN

## 2021-07-16 NOTE — Progress Notes (Signed)
We are sorry that you are not feeling well.  Here is how we plan to help!  Based on your presentation I believe you most likely have A cough due to bacteria.  When patients have a fever and a productive cough with a change in color or increased sputum production, we are concerned about bacterial bronchitis.  If left untreated it can progress to pneumonia.  If your symptoms do not improve with your treatment plan it is important that you contact your provider.   I have prescribed Azithromyin 250 mg: two tablets now and then one tablet daily for 4 additonal days    In addition you may use A prescription cough medication called Tessalon Perles '100mg'$ . You may take 1-2 capsules every 8 hours as needed for your cough.  You were just given a steroid less tan a month ago, so should not need to repeat that.  From your responses in the eVisit questionnaire you describe inflammation in the upper respiratory tract which is causing a significant cough.  This is commonly called Bronchitis and has four common causes:   Allergies Viral Infections Acid Reflux Bacterial Infection Allergies, viruses and acid reflux are treated by controlling symptoms or eliminating the cause. An example might be a cough caused by taking certain blood pressure medications. You stop the cough by changing the medication. Another example might be a cough caused by acid reflux. Controlling the reflux helps control the cough.  USE OF BRONCHODILATOR ("RESCUE") INHALERS: There is a risk from using your bronchodilator too frequently.  The risk is that over-reliance on a medication which only relaxes the muscles surrounding the breathing tubes can reduce the effectiveness of medications prescribed to reduce swelling and congestion of the tubes themselves.  Although you feel brief relief from the bronchodilator inhaler, your asthma may actually be worsening with the tubes becoming more swollen and filled with mucus.  This can delay other crucial  treatments, such as oral steroid medications. If you need to use a bronchodilator inhaler daily, several times per day, you should discuss this with your provider.  There are probably better treatments that could be used to keep your asthma under control.     HOME CARE Only take medications as instructed by your medical team. Complete the entire course of an antibiotic. Drink plenty of fluids and get plenty of rest. Avoid close contacts especially the very young and the elderly Cover your mouth if you cough or cough into your sleeve. Always remember to wash your hands A steam or ultrasonic humidifier can help congestion.   GET HELP RIGHT AWAY IF: You develop worsening fever. You become short of breath You cough up blood. Your symptoms persist after you have completed your treatment plan MAKE SURE YOU  Understand these instructions. Will watch your condition. Will get help right away if you are not doing well or get worse.    Thank you for choosing an e-visit.  Your e-visit answers were reviewed by a board certified advanced clinical practitioner to complete your personal care plan. Depending upon the condition, your plan could have included both over the counter or prescription medications.  Please review your pharmacy choice. Make sure the pharmacy is open so you can pick up prescription now. If there is a problem, you may contact your provider through [employer] and have the prescription routed to another pharmacy.  Your safety is important to [place]. If you have drug allergies check your prescription carefully.   For the next 24  hours you can use MyChart to ask questions about today's visit, request a non-urgent call back, or ask for a work or school excuse. You will get an email in the next two days asking about your experience. I hope that your e-visit has been valuable and will speed your recovery.  5-10 minutes spent reviewing and documenting in chart.

## 2021-07-18 ENCOUNTER — Other Ambulatory Visit: Payer: Self-pay

## 2021-07-18 ENCOUNTER — Other Ambulatory Visit: Payer: Self-pay | Admitting: Family Medicine

## 2021-07-18 MED ORDER — ALBUTEROL SULFATE (2.5 MG/3ML) 0.083% IN NEBU
2.5000 mg | INHALATION_SOLUTION | Freq: Four times a day (QID) | RESPIRATORY_TRACT | 6 refills | Status: AC | PRN
  Filled 2021-07-18: qty 75, 6d supply, fill #0

## 2021-07-18 MED ORDER — ALBUTEROL SULFATE HFA 108 (90 BASE) MCG/ACT IN AERS: 2.0000 | INHALATION_SPRAY | RESPIRATORY_TRACT | 0 refills | Status: DC | PRN

## 2021-07-18 MED ORDER — PSEUDOEPH-BROMPHEN-DM 30-2-10 MG/5ML PO SYRP: 5.0000 mL | ORAL_SOLUTION | Freq: Four times a day (QID) | ORAL | 0 refills | Status: DC | PRN

## 2021-07-18 MED ORDER — FLUTICASONE PROPIONATE HFA 110 MCG/ACT IN AERO: 1.0000 | INHALATION_SPRAY | Freq: Two times a day (BID) | RESPIRATORY_TRACT | 0 refills | Status: DC

## 2021-07-18 NOTE — Addendum Note (Signed)
Addended by: Mar Daring on: 07/18/2021 10:55 AM   Modules accepted: Orders

## 2021-07-18 NOTE — Progress Notes (Signed)
E-Visit  for Positive Covid Test Result  We are sorry you are not feeling well. We are here to help!  You have tested positive for COVID-19, meaning that you were infected with the novel coronavirus and could give the virus to others.  It is vitally important that you stay home so you do not spread it to others.      Please continue isolation at home, for at least 10 days since the start of your symptoms and until you have had 24 hours with no fever (without taking a fever reducer) and with improving of symptoms.  If you have no symptoms but tested positive (or all symptoms resolve after 5 days and you have no fever) you can leave your house but continue to wear a mask around others for an additional 5 days. If you have a fever,continue to stay home until you have had 24 hours of no fever. Most cases improve 5-10 days from onset but we have seen a small number of patients who have gotten worse after the 10 days.  Please be sure to watch for worsening symptoms and remain taking the proper precautions.   Go to the nearest hospital ED for assessment if fever/cough/breathlessness are severe or illness seems like a threat to life.    The following symptoms may appear 2-14 days after exposure: Fever Cough Shortness of breath or difficulty breathing Chills Repeated shaking with chills Muscle pain Headache Sore throat New loss of taste or smell Fatigue Congestion or runny nose Nausea or vomiting Diarrhea  You have been enrolled in [place] for COVID-19. Daily you will receive a questionnaire within the [place] website. Our COVID-19 response team will be monitoring your responses daily.  You can use medication such as  prescription inhaler called Albuterol MDI 90 mcg /actuation 2 puffs every 4 hours as needed for shortness of breath, wheezing, cough; Flovent 143mg nihaler 1 puff twice daily; Bromfed DM for cough  You may also take acetaminophen (Tylenol) as needed for  fever.  HOME CARE: Only take medications as instructed by your medical team. Drink plenty of fluids and get plenty of rest. A steam or ultrasonic humidifier can help if you have congestion.   GET HELP RIGHT AWAY IF YOU HAVE EMERGENCY WARNING SIGNS.  Call 911 or proceed to your closest emergency facility if: You develop worsening high fever. Trouble breathing Bluish lips or face Persistent pain or pressure in the chest New confusion Inability to wake or stay awake You cough up blood. Your symptoms become more severe Inability to hold down food or fluids  This list is not all possible symptoms. Contact your medical provider for any symptoms that are severe or concerning to you.    Your e-visit answers were reviewed by a board certified advanced clinical practitioner to complete your personal care plan.  Depending on the condition, your plan could have included both over the counter or prescription medications.  If there is a problem please reply once you have received a response from your provider.  Your safety is important to [place]  If you have drug allergies check your prescription carefully.    You can use MyChart to ask questions about today's visit, request a non-urgent call back, or ask for a work or school excuse for 24 hours related to this e-Visit. If it has been greater than 24 hours you will need to follow up with your provider, or enter a new e-Visit to address those concerns. You  will get an e-mail in the next two days asking about your experience.  I hope that your e-visit has been valuable and will speed your recovery. Thank you for using e-visits.  I provided 7 minutes of non face-to-face time during this encounter for chart review and documentation.

## 2021-07-23 ENCOUNTER — Other Ambulatory Visit: Payer: Self-pay

## 2021-07-31 ENCOUNTER — Other Ambulatory Visit: Payer: Self-pay

## 2021-08-13 ENCOUNTER — Inpatient Hospital Stay (HOSPITAL_BASED_OUTPATIENT_CLINIC_OR_DEPARTMENT_OTHER): Payer: 59 | Admitting: Internal Medicine

## 2021-08-13 ENCOUNTER — Encounter: Payer: Self-pay | Admitting: Internal Medicine

## 2021-08-13 ENCOUNTER — Inpatient Hospital Stay: Payer: 59 | Attending: Internal Medicine

## 2021-08-13 ENCOUNTER — Other Ambulatory Visit: Payer: Self-pay | Admitting: *Deleted

## 2021-08-13 DIAGNOSIS — Z803 Family history of malignant neoplasm of breast: Secondary | ICD-10-CM | POA: Insufficient documentation

## 2021-08-13 DIAGNOSIS — D6851 Activated protein C resistance: Secondary | ICD-10-CM | POA: Diagnosis not present

## 2021-08-13 DIAGNOSIS — D500 Iron deficiency anemia secondary to blood loss (chronic): Secondary | ICD-10-CM

## 2021-08-13 DIAGNOSIS — Z818 Family history of other mental and behavioral disorders: Secondary | ICD-10-CM | POA: Diagnosis not present

## 2021-08-13 DIAGNOSIS — Z833 Family history of diabetes mellitus: Secondary | ICD-10-CM | POA: Insufficient documentation

## 2021-08-13 DIAGNOSIS — Z79899 Other long term (current) drug therapy: Secondary | ICD-10-CM | POA: Insufficient documentation

## 2021-08-13 DIAGNOSIS — Z836 Family history of other diseases of the respiratory system: Secondary | ICD-10-CM | POA: Insufficient documentation

## 2021-08-13 DIAGNOSIS — D509 Iron deficiency anemia, unspecified: Secondary | ICD-10-CM | POA: Diagnosis not present

## 2021-08-13 DIAGNOSIS — J45909 Unspecified asthma, uncomplicated: Secondary | ICD-10-CM | POA: Insufficient documentation

## 2021-08-13 DIAGNOSIS — Z86711 Personal history of pulmonary embolism: Secondary | ICD-10-CM | POA: Insufficient documentation

## 2021-08-13 DIAGNOSIS — I82461 Acute embolism and thrombosis of right calf muscular vein: Secondary | ICD-10-CM

## 2021-08-13 DIAGNOSIS — Z800 Family history of malignant neoplasm of digestive organs: Secondary | ICD-10-CM | POA: Insufficient documentation

## 2021-08-13 DIAGNOSIS — D6859 Other primary thrombophilia: Secondary | ICD-10-CM | POA: Diagnosis present

## 2021-08-13 DIAGNOSIS — Z808 Family history of malignant neoplasm of other organs or systems: Secondary | ICD-10-CM | POA: Insufficient documentation

## 2021-08-13 DIAGNOSIS — Z8249 Family history of ischemic heart disease and other diseases of the circulatory system: Secondary | ICD-10-CM | POA: Diagnosis not present

## 2021-08-13 DIAGNOSIS — Z86718 Personal history of other venous thrombosis and embolism: Secondary | ICD-10-CM | POA: Insufficient documentation

## 2021-08-13 DIAGNOSIS — Z8349 Family history of other endocrine, nutritional and metabolic diseases: Secondary | ICD-10-CM | POA: Diagnosis not present

## 2021-08-13 DIAGNOSIS — E669 Obesity, unspecified: Secondary | ICD-10-CM | POA: Insufficient documentation

## 2021-08-13 DIAGNOSIS — R5383 Other fatigue: Secondary | ICD-10-CM | POA: Insufficient documentation

## 2021-08-13 LAB — CBC WITH DIFFERENTIAL/PLATELET
Abs Immature Granulocytes: 0.01 10*3/uL (ref 0.00–0.07)
Basophils Absolute: 0 10*3/uL (ref 0.0–0.1)
Basophils Relative: 1 %
Eosinophils Absolute: 0.2 10*3/uL (ref 0.0–0.5)
Eosinophils Relative: 4 %
HCT: 25.4 % — ABNORMAL LOW (ref 36.0–46.0)
Hemoglobin: 6.8 g/dL — ABNORMAL LOW (ref 12.0–15.0)
Immature Granulocytes: 0 %
Lymphocytes Relative: 39 %
Lymphs Abs: 1.9 10*3/uL (ref 0.7–4.0)
MCH: 18.6 pg — ABNORMAL LOW (ref 26.0–34.0)
MCHC: 26.8 g/dL — ABNORMAL LOW (ref 30.0–36.0)
MCV: 69.6 fL — ABNORMAL LOW (ref 80.0–100.0)
Monocytes Absolute: 0.4 10*3/uL (ref 0.1–1.0)
Monocytes Relative: 8 %
Neutro Abs: 2.3 10*3/uL (ref 1.7–7.7)
Neutrophils Relative %: 48 %
Platelets: 274 10*3/uL (ref 150–400)
RBC: 3.65 MIL/uL — ABNORMAL LOW (ref 3.87–5.11)
RDW: 17.4 % — ABNORMAL HIGH (ref 11.5–15.5)
WBC: 4.8 10*3/uL (ref 4.0–10.5)
nRBC: 0 % (ref 0.0–0.2)

## 2021-08-13 LAB — IRON AND TIBC
Iron: 19 ug/dL — ABNORMAL LOW (ref 28–170)
Saturation Ratios: 4 % — ABNORMAL LOW (ref 10.4–31.8)
TIBC: 470 ug/dL — ABNORMAL HIGH (ref 250–450)
UIBC: 451 ug/dL

## 2021-08-13 LAB — BASIC METABOLIC PANEL
Anion gap: 7 (ref 5–15)
BUN: 12 mg/dL (ref 6–20)
CO2: 26 mmol/L (ref 22–32)
Calcium: 8.8 mg/dL — ABNORMAL LOW (ref 8.9–10.3)
Chloride: 105 mmol/L (ref 98–111)
Creatinine, Ser: 0.58 mg/dL (ref 0.44–1.00)
GFR, Estimated: >60 mL/min (ref >60)
Glucose, Bld: 102 mg/dL — ABNORMAL HIGH (ref 70–99)
Potassium: 4.1 mmol/L (ref 3.5–5.1)
Sodium: 138 mmol/L (ref 135–145)

## 2021-08-13 LAB — FERRITIN: Ferritin: 2 ng/mL — ABNORMAL LOW (ref 11–307)

## 2021-08-13 MED ORDER — RIVAROXABAN 20 MG PO TABS: 20.0000 mg | ORAL_TABLET | Freq: Every day | ORAL | 6 refills | Status: DC

## 2021-08-13 NOTE — Assessment & Plan Note (Addendum)
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  Compliance]. Currently on xalreto.  No concerns for any recurrent DVT PE.  #Patient currently on Xarelto.  Tolerating well.  New prescription given.   # Iron deficient anemia- s/p myomectomy; hemoglobin 6.8 recommend IV iron infusion.  Weekly x4.  Discussed with the patient.   # DISPOSITION:  #Venofer weekly x4-ASAP next week #Follow-up in 3 months-NP; CBC; possible Venofer # follow up in 6 months; MD- labs-cbc/-bmp; possible Venofer/-Dr.B  Cc; Rumbal, [place]

## 2021-08-13 NOTE — Progress Notes (Signed)
[facility] NOTE  Patient Care Team: [facility] as PCP - General (Family Medicine)  CHIEF COMPLAINTS/PURPOSE OF CONSULTATION: DVT/PE  # provoked DVT in 2011 [lesser saphenous vein left lower extremity/below calf; BCP/long car ride x 6 months anticoagulation]; Apr 07, 2019-unprovoked right lower extremity DVT nonocclusive-Xarelto x75m; October 2020-factor V Leiden heterozygous; Xarelto 10 mg/day.  Mar 23, 2020-recommend Lovenox 120 mg twice daily [plans to get pregnant but ? compliance]; FEB 28th, 2022-bilateral PE status post thrombectomy [Dr.Schneir]; left lower extremity DVT-stop Lovenox; switch over to Xarelto.   #Hypercoagulable state-2011?  Factor V Leiden heterozygosity [as per patient; [facility]]-no records  #June 2020-anemia likely iron deficiency/menstrual periods.  Status post myomectomy-improved.  Oncology History   No history exists.    HISTORY OF PRESENTING ILLNESS:  Tara Shea 40 y.o.  female history of recurrent DVT right lower extremity;PE/left lower extremity DVT factor V Leiden heterozygous on indefinite anticoagulation with is here for follow-up.    Patient had ultrasound in April 2022 that showed improving left-sided DVT.  Patient continues to be compliant with her Xarelto.  In the interim patient got diagnosed with COVID in August 2022.  Continues to complain of shortness of breath on exertion.  No cough.  No worsening swelling in legs.  Denies any blood in stools or black or stools.  Her menstrual cycles are not as heavy as they used to be s/p myomectomy.  Patient denies any plans for conceiving.  Review of Systems  Constitutional:  Positive for malaise/fatigue. Negative for chills, diaphoresis, fever and weight loss.  HENT:  Negative for nosebleeds and sore throat.   Eyes:  Negative for double vision.  Respiratory:  Negative for cough, hemoptysis, sputum production, shortness of breath and wheezing.    Cardiovascular:  Negative for chest pain, palpitations, orthopnea and leg swelling.  Gastrointestinal:  Negative for abdominal pain, blood in stool, constipation, diarrhea, heartburn, melena, nausea and vomiting.  Genitourinary:  Negative for dysuria, frequency and urgency.  Musculoskeletal:  Negative for back pain and joint pain.  Skin: Negative.  Negative for itching and rash.  Neurological:  Negative for dizziness, tingling, focal weakness, weakness and headaches.  Endo/Heme/Allergies:  Does not bruise/bleed easily.  Psychiatric/Behavioral:  Negative for depression. The patient is not nervous/anxious and does not have insomnia.     MEDICAL HISTORY:  Past Medical History:  Diagnosis Date  . Asthma   . Bone spur   . Cholelithiasis 07/26/2019  . Complication of anesthesia    pt had hypotensive episode and has had n & v  . DVT (deep venous thrombosis) ([place]) 03/2019   x 1 and DEC 2011  . Factor 5 Leiden mutation, heterozygous ([place])    stopped Xarelto 08/23/19  . Fibroid   . History of kidney stones   . IDA (iron deficiency anemia)    vitamin b12 and iron deficiency  . Migraines   . PCOS (polycystic ovarian syndrome)   . PCOS (polycystic ovarian syndrome)     SURGICAL HISTORY: Past Surgical History:  Procedure Laterality Date  . CHOLECYSTECTOMY N/A 07/28/2019   Procedure: LAPAROSCOPIC CHOLECYSTECTOMY;  Surgeon: Jules Husbands, MD;  Location: [facility];  Service: General;  Laterality: N/A;  . HYSTEROSCOPY WITH D & C N/A 08/31/2019   Procedure: DILATATION AND CURETTAGE /HYSTEROSCOPY/ SUBMUCOSAL MYOMECTOMY;  Surgeon: Will Bonnet, MD;  Location: [facility];  Service: Gynecology;  Laterality: N/A;  . HYSTEROSCOPY WITH D & C N/A 10/03/2019   Procedure: DILATATION AND CURETTAGE /HYSTEROSCOPY/  SUBMUCOSAL MYOMECTOMY;  Surgeon: Will Bonnet, MD;  Location: [facility];  Service: Gynecology;  Laterality: N/A;  . HYSTEROSCOPY WITH D & C N/A 10/26/2019   Procedure: DILATATION AND CURETTAGE  /HYSTEROSCOPY, SUBMUCOSAL MYOMECTOMY;  Surgeon: Will Bonnet, MD;  Location: [facility];  Service: Gynecology;  Laterality: N/A;  . MYOMECTOMY    . PULMONARY THROMBECTOMY N/A 01/21/2021   Procedure: PULMONARY THROMBECTOMY;  Surgeon: Katha Cabal, MD;  Location: [facility];  Service: Cardiovascular;  Laterality: N/A;  . TONSILLECTOMY    . WISDOM TOOTH EXTRACTION    . WRIST SURGERY     age 21 - rebroke it b/c it grew back wrong    SOCIAL HISTORY: Social History   Socioeconomic History  . Marital status: Married    Spouse name: adam  . Number of children: 0  . Years of education: 81  . Highest education level: Not on file  Occupational History  . 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 Smoking status: Never  . Smokeless tobacco: Never  Vaping Use  . Vaping Use: Never used  Substance and Sexual Activity  . Alcohol use: Yes    Comment: monthly  . Drug use: No  . Sexual activity: Yes  Other Topics Concern  . 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  No children.        Social Determinants of Health   Financial Resource Strain: Not on file  Food Insecurity: Not on file  Transportation Needs: Not on file  Physical Activity: Not on file  Stress: Not on file  Social Connections: Not on file  Intimate Partner Violence: Not on file    FAMILY HISTORY: Family History  Problem Relation Age of Onset  . Heart murmur Mother   . Thyroid disease Mother   . Migraines Mother   . Asthma Mother   . Diabetes Mother        TYPE 2  . Bipolar disorder Mother   . Breast cancer Maternal Aunt 51  . Cancer Maternal Uncle        skin  . Cancer Maternal Grandmother 57       colon  . Hypertension Maternal Grandmother   . Cancer Maternal Aunt 50       colon  . Diabetes Maternal Aunt     ALLERGIES:  is allergic to covid-19 (mrna) vaccine.  MEDICATIONS:  Current  Outpatient Medications  Medication Sig Dispense Refill  . albuterol (PROVENTIL) (2.5 MG/3ML) 0.083% nebulizer solution Take 3 mLs (2.5 mg total) by nebulization every 6 (six) hours as needed for wheezing or shortness of breath. 75 mL 6  . albuterol (VENTOLIN HFA) 108 (90 Base) MCG/ACT inhaler Inhale 2 puffs into the lungs every 4 (four) hours as needed for wheezing or shortness of breath. 8 g 0  . ferrous sulfate 325 (65 FE) MG tablet Take 325 mg by mouth daily.    . fluticasone (FLOVENT HFA) 110 MCG/ACT inhaler Inhale 1 puff into the lungs 2 (two) times daily. 12 g 0  . Multiple Vitamins-Minerals (MULTIVITAMIN WITH MINERALS) tablet Take 1 tablet by mouth daily.     . ondansetron (ZOFRAN ODT) 4 MG disintegrating tablet Take 1 tablet (4 mg total) by mouth every 6 (six) hours as needed for nausea. 60 tablet 1  . promethazine (PHENERGAN) 25 MG tablet  Take 1 tablet (25 mg total) by mouth every 8 (eight) hours as needed for nausea or vomiting. 60 tablet 1  . Spacer/Aero-Holding Chambers (AEROCHAMBER PLUS) inhaler Use as instructed 1 each 2  . vitamin C (ASCORBIC ACID) 500 MG tablet Take 500 mg by mouth daily.    . rivaroxaban (XARELTO) 20 MG TABS tablet Take 1 tablet (20 mg total) by mouth daily with supper. 30 tablet 6   No current facility-administered medications for this visit.      Marland Kitchen  PHYSICAL EXAMINATION:  Vitals:   08/13/21 1450  BP: 129/85  Pulse: 85  Resp: 16  Temp: (!) 97.4 F (36.3 C)  SpO2: 100%   Filed Weights   08/13/21 1450  Weight: (!) 320 lb (145.2 kg)    Physical Exam Constitutional:      Comments: Obese.  HENT:     Head: Normocephalic and atraumatic.     Mouth/Throat:     Pharynx: No oropharyngeal exudate.  Eyes:     Pupils: Pupils are equal, round, and reactive to light.  Cardiovascular:     Rate and Rhythm: Normal rate and regular rhythm.  Pulmonary:     Effort: Pulmonary effort is normal. No respiratory distress.     Breath sounds: Normal breath  sounds. No wheezing.  Abdominal:     General: Bowel sounds are normal. There is no distension.     Palpations: Abdomen is soft. There is no mass.     Tenderness: There is no abdominal tenderness. There is no guarding or rebound.  Musculoskeletal:        General: No tenderness. Normal range of motion.     Cervical back: Normal range of motion and neck supple.  Skin:    General: Skin is warm.  Neurological:     Mental Status: She is alert and oriented to person, place, and time.  Psychiatric:        Mood and Affect: Affect normal.    LABORATORY DATA:  I have reviewed the data as listed Lab Results  Component Value Date   WBC 4.8 08/13/2021   HGB 6.8 (L) 08/13/2021   HCT 25.4 (L) 08/13/2021   MCV 69.6 (L) 08/13/2021   PLT 274 08/13/2021   Recent Labs    01/22/21 0548 02/10/21 1425 08/13/21 1416  NA 139 139 138  K 3.8 4.1 4.1  CL 106 106 105  CO2 23 25 26   GLUCOSE 102* 109* 102*  BUN 14 11 12   CREATININE 0.67 0.76 0.58  CALCIUM 8.9 8.9 8.8*  GFRNONAA >60 >60 >60  PROT 6.5 7.0  --   ALBUMIN 3.6 3.7  --   AST 13* 14*  --   ALT 11 13  --   ALKPHOS 56 51  --   BILITOT 0.5 0.3  --     RADIOGRAPHIC STUDIES: I have personally reviewed the radiological images as listed and agreed with the findings in the report. No results found.   ASSESSMENT & PLAN:   Acute deep vein thrombosis (DVT) of calf muscle vein of right lower extremity # RECURRENT thromboembolic heterozygous factor V Leiden with recurrent left lower extremity DVT [unprovoked; May 2020]; February 2022/also bilateral PE right heart strain on imaging CT [while on Lovenox 1 mg/kg- ?  Compliance]. Currently on xalreto.  No concerns for any recurrent DVT PE.  #Patient currently on Xarelto.  Tolerating well.  New prescription given.   #Iron deficient anemia- s/p myomectomy-improved await CBC from today.  # DISPOSITION:  #  follow up in 6 months; MD- labs-cbc/-bmp/-Dr.B  Cc; [place], [place]   All questions were  answered. The patient knows to call the clinic with any problems, questions or concerns.     Cammie Sickle, MD 08/13/2021 3:48 PM

## 2021-08-13 NOTE — Progress Notes (Signed)
Pt in for follow up, reports had covid in August and is still experiencing some breathing issues per pt "I get winded easily".

## 2021-08-14 ENCOUNTER — Telehealth: Payer: Self-pay | Admitting: Internal Medicine

## 2021-08-14 NOTE — Telephone Encounter (Signed)
Pt left VM that she can't do any of the appointments scheduled on her behalf. She is requesting a return cal to modify the scheduled dates.

## 2021-08-14 NOTE — Telephone Encounter (Signed)
All appts has been done and changed per pt request.

## 2021-08-18 ENCOUNTER — Inpatient Hospital Stay: Payer: 59

## 2021-08-21 ENCOUNTER — Inpatient Hospital Stay: Payer: 59

## 2021-08-21 VITALS — BP 130/94 | HR 82

## 2021-08-21 DIAGNOSIS — D6851 Activated protein C resistance: Secondary | ICD-10-CM

## 2021-08-21 DIAGNOSIS — I2699 Other pulmonary embolism without acute cor pulmonale: Secondary | ICD-10-CM

## 2021-08-21 DIAGNOSIS — D500 Iron deficiency anemia secondary to blood loss (chronic): Secondary | ICD-10-CM

## 2021-08-21 DIAGNOSIS — N979 Female infertility, unspecified: Secondary | ICD-10-CM

## 2021-08-21 DIAGNOSIS — E538 Deficiency of other specified B group vitamins: Secondary | ICD-10-CM

## 2021-08-21 DIAGNOSIS — E282 Polycystic ovarian syndrome: Secondary | ICD-10-CM

## 2021-08-21 MED ORDER — IRON SUCROSE 20 MG/ML IV SOLN
200.0000 mg | Freq: Once | INTRAVENOUS | Status: AC
Start: 2021-08-21 — End: 2021-08-21
  Administered 2021-08-21: 200 mg via INTRAVENOUS
  Filled 2021-08-21: qty 10

## 2021-08-21 MED ORDER — SODIUM CHLORIDE 0.9 % IV SOLN
Freq: Once | INTRAVENOUS | Status: AC
Start: 2021-08-21 — End: 2021-08-21
  Filled 2021-08-21: qty 250

## 2021-08-25 ENCOUNTER — Ambulatory Visit: Payer: 59

## 2021-08-26 ENCOUNTER — Telehealth: Payer: Self-pay

## 2021-08-26 NOTE — Telephone Encounter (Signed)
[facility] referring for polycystic ovarian syndrome) ,Infertility, female. MD only. Called and left voicemail for patient to call back to be scheduled.

## 2021-08-27 ENCOUNTER — Encounter: Payer: Self-pay | Admitting: Internal Medicine

## 2021-08-28 ENCOUNTER — Telehealth: Payer: Self-pay | Admitting: *Deleted

## 2021-08-28 ENCOUNTER — Other Ambulatory Visit: Payer: Self-pay | Admitting: *Deleted

## 2021-08-28 ENCOUNTER — Ambulatory Visit: Payer: 59

## 2021-08-28 DIAGNOSIS — I82462 Acute embolism and thrombosis of left calf muscular vein: Secondary | ICD-10-CM

## 2021-08-28 DIAGNOSIS — R600 Localized edema: Secondary | ICD-10-CM

## 2021-08-28 DIAGNOSIS — I82461 Acute embolism and thrombosis of right calf muscular vein: Secondary | ICD-10-CM

## 2021-08-28 NOTE — Telephone Encounter (Signed)
Received mychart msg from patient.    I've been having intermittent left calf pain and upper/anterior thigh pain for 2 days. Tonight (Wednesday night) I noticed some swelling and tightness in my ankle. I've had left ankle swelling  (although rarely) since my original DVT in that leg over a decade ago. I can't say for sure if my calf is swollen as I've always had very wide calf's. There are no areas of redness or warmth on my leg.    I haven't missed any doses of Xaralto nor have I taken any long trips/plane flights, etc. I take the Xaralto every night with dinner.  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  I have not had any injuries or falls.   The only symptom I had with this past DVT was knee pain. I did physical therapy because some I wasn't having calf pain or swelling and had known issues with that knee.  The DVT before I didn't have swelling, but did have calf pain; I was not on blood thinners at the time.    With my symptoms and history I thought I should message you to see if you believe I need an ultrasound of my left leg.   Thanks, Tara Shea

## 2021-08-28 NOTE — Telephone Encounter (Signed)
Called and left voicemail for patient to call back to be scheduled. 

## 2021-08-28 NOTE — Telephone Encounter (Signed)
Spoke with Merrily Pew, NP. Merrily Pew will order a stat doppler left lower extremity.  I spoke with patient. Pt agreeable to have doppler either on Friday or Monday. She is off of work on these 2 days.  [facility] - please set up.  We will call pt with test results.

## 2021-08-29 ENCOUNTER — Other Ambulatory Visit: Payer: Self-pay

## 2021-08-29 ENCOUNTER — Ambulatory Visit
Admission: RE | Admit: 2021-08-29 | Discharge: 2021-08-29 | Disposition: A | Discharge status: Home / Self Care | Payer: 59 | Source: Ambulatory Visit | Attending: Hospice and Palliative Medicine | Admitting: Hospice and Palliative Medicine

## 2021-08-29 ENCOUNTER — Inpatient Hospital Stay: Payer: 59 | Attending: Internal Medicine

## 2021-08-29 DIAGNOSIS — E538 Deficiency of other specified B group vitamins: Secondary | ICD-10-CM

## 2021-08-29 DIAGNOSIS — I82461 Acute embolism and thrombosis of right calf muscular vein: Secondary | ICD-10-CM

## 2021-08-29 DIAGNOSIS — I82462 Acute embolism and thrombosis of left calf muscular vein: Secondary | ICD-10-CM

## 2021-08-29 DIAGNOSIS — R600 Localized edema: Secondary | ICD-10-CM

## 2021-08-29 DIAGNOSIS — D500 Iron deficiency anemia secondary to blood loss (chronic): Secondary | ICD-10-CM

## 2021-08-29 DIAGNOSIS — D509 Iron deficiency anemia, unspecified: Secondary | ICD-10-CM | POA: Insufficient documentation

## 2021-08-29 MED ORDER — IRON SUCROSE 20 MG/ML IV SOLN
200.0000 mg | Freq: Once | INTRAVENOUS | Status: AC
  Administered 2021-08-29: 200 mg via INTRAVENOUS
  Filled 2021-08-29: qty 10

## 2021-08-29 MED ORDER — SODIUM CHLORIDE 0.9 % IV SOLN
Freq: Once | INTRAVENOUS | Status: AC
  Filled 2021-08-29: qty 250

## 2021-08-29 NOTE — Telephone Encounter (Signed)
Called and left voicemail for patient to call back to be scheduled. 

## 2021-09-01 ENCOUNTER — Inpatient Hospital Stay: Payer: 59

## 2021-09-01 ENCOUNTER — Other Ambulatory Visit: Payer: Self-pay

## 2021-09-01 VITALS — BP 148/84 | HR 71 | Temp 97.7°F | Resp 18

## 2021-09-01 DIAGNOSIS — E538 Deficiency of other specified B group vitamins: Secondary | ICD-10-CM

## 2021-09-01 DIAGNOSIS — D500 Iron deficiency anemia secondary to blood loss (chronic): Secondary | ICD-10-CM

## 2021-09-01 DIAGNOSIS — D509 Iron deficiency anemia, unspecified: Secondary | ICD-10-CM | POA: Diagnosis not present

## 2021-09-01 MED ORDER — IRON SUCROSE 20 MG/ML IV SOLN
200.0000 mg | Freq: Once | INTRAVENOUS | Status: AC
  Administered 2021-09-01: 200 mg via INTRAVENOUS
  Filled 2021-09-01: qty 10

## 2021-09-01 MED ORDER — SODIUM CHLORIDE 0.9 % IV SOLN
Freq: Once | INTRAVENOUS | Status: AC
  Filled 2021-09-01: qty 250

## 2021-09-01 NOTE — Telephone Encounter (Signed)
Left detailed vm that her ultrasound was normal. I asked the patient to call our office back or send a mychart msg if she was not feeling any better.  Mychart msg also sent to patient

## 2021-09-04 ENCOUNTER — Telehealth: Payer: 59 | Admitting: Physician Assistant

## 2021-09-04 DIAGNOSIS — L03039 Cellulitis of unspecified toe: Secondary | ICD-10-CM | POA: Diagnosis not present

## 2021-09-05 MED ORDER — SULFAMETHOXAZOLE-TRIMETHOPRIM 800-160 MG PO TABS: 1.0000 | ORAL_TABLET | Freq: Two times a day (BID) | ORAL | 0 refills | Status: DC

## 2021-09-05 MED ORDER — FLUCONAZOLE 150 MG PO TABS: 150.0000 mg | ORAL_TABLET | Freq: Once | ORAL | 0 refills | Status: AC

## 2021-09-05 NOTE — Progress Notes (Signed)
E Visit for Cellulitis  We are sorry that you are not feeling well. Here is how we plan to help!  Based on what you shared with me it looks like you have cellulitis.  Cellulitis looks like areas of skin redness, swelling, and warmth; it develops as a result of bacteria entering under the skin. Little red spots and/or bleeding can be seen in skin, and tiny surface sacs containing fluid can occur. Fever can be present. Cellulitis is almost always on one side of a body, and the lower limbs are the most common site of involvement. I do also believe this is paronychia, infection of the nail bed.  I have prescribed:  Bactrim DS 1 tablet by mouth twice a day for 7 days  HOME CARE:  Take your medications as ordered and take all of them, even if the skin irritation appears to be healing.   GET HELP RIGHT AWAY IF:  Symptoms that don't begin to go away within 48 hours. Severe redness persists or worsens If the area turns color, spreads or swells. If it blisters and opens, develops yellow-brown crust or bleeds. You develop a fever or chills. If the pain increases or becomes unbearable.  Are unable to keep fluids and food down.  MAKE SURE YOU   Understand these instructions. Will watch your condition. Will get help right away if you are not doing well or get worse.  Thank you for choosing an e-visit.  Your e-visit answers were reviewed by a board certified advanced clinical practitioner to complete your personal care plan. Depending upon the condition, your plan could have included both over the counter or prescription medications.  Please review your pharmacy choice. Make sure the pharmacy is open so you can pick up prescription now. If there is a problem, you may contact your provider through [employer] and have the prescription routed to another pharmacy.  Your safety is important to [place]. If you have drug allergies check your prescription carefully.   For the next 24 hours you can use  MyChart to ask questions about today's visit, request a non-urgent call back, or ask for a work or school excuse. You will get an email in the next two days asking about your experience. I hope that your e-visit has been valuable and will speed your recovery.  I provided 5 minutes of non face-to-face time during this encounter for chart review and documentation.

## 2021-09-05 NOTE — Addendum Note (Signed)
Addended by: Mar Daring on: 09/05/2021 07:21 AM   Modules accepted: Orders

## 2021-09-08 ENCOUNTER — Ambulatory Visit: Payer: 59

## 2021-09-10 ENCOUNTER — Ambulatory Visit: Payer: 59 | Admitting: Family Medicine

## 2021-09-10 ENCOUNTER — Encounter: Payer: Self-pay | Admitting: Family Medicine

## 2021-09-10 ENCOUNTER — Other Ambulatory Visit: Payer: Self-pay

## 2021-09-10 ENCOUNTER — Inpatient Hospital Stay: Payer: 59

## 2021-09-10 VITALS — BP 122/80 | HR 99 | Temp 97.6°F | Resp 18 | Ht 68.0 in | Wt 319.9 lb

## 2021-09-10 VITALS — BP 117/75 | HR 78 | Temp 97.5°F | Resp 18

## 2021-09-10 DIAGNOSIS — M79605 Pain in left leg: Secondary | ICD-10-CM | POA: Diagnosis not present

## 2021-09-10 DIAGNOSIS — R110 Nausea: Secondary | ICD-10-CM | POA: Diagnosis not present

## 2021-09-10 DIAGNOSIS — D509 Iron deficiency anemia, unspecified: Secondary | ICD-10-CM | POA: Diagnosis not present

## 2021-09-10 DIAGNOSIS — D500 Iron deficiency anemia secondary to blood loss (chronic): Secondary | ICD-10-CM

## 2021-09-10 DIAGNOSIS — N921 Excessive and frequent menstruation with irregular cycle: Secondary | ICD-10-CM

## 2021-09-10 DIAGNOSIS — E538 Deficiency of other specified B group vitamins: Secondary | ICD-10-CM

## 2021-09-10 LAB — CBC
HCT: 33.5 % — ABNORMAL LOW (ref 35.0–45.0)
Hemoglobin: 10 g/dL — ABNORMAL LOW (ref 11.7–15.5)
MCH: 24 pg — ABNORMAL LOW (ref 27.0–33.0)
MCHC: 29.9 g/dL — ABNORMAL LOW (ref 32.0–36.0)
MCV: 80.3 fL (ref 80.0–100.0)
MPV: 9.6 fL (ref 7.5–12.5)
Platelets: 232 10*3/uL (ref 140–400)
RBC: 4.17 10*6/uL (ref 3.80–5.10)
WBC: 3.3 10*3/uL — ABNORMAL LOW (ref 3.8–10.8)

## 2021-09-10 MED ORDER — ONDANSETRON 4 MG PO TBDP: 4.0000 mg | ORAL_TABLET | Freq: Four times a day (QID) | ORAL | 1 refills | Status: DC | PRN

## 2021-09-10 MED ORDER — SODIUM CHLORIDE 0.9 % IV SOLN
Freq: Once | INTRAVENOUS | Status: AC
Start: 2021-09-10 — End: 2021-09-10
  Filled 2021-09-10: qty 250

## 2021-09-10 MED ORDER — IRON SUCROSE 20 MG/ML IV SOLN
200.0000 mg | Freq: Once | INTRAVENOUS | Status: AC
  Administered 2021-09-10: 200 mg via INTRAVENOUS
  Filled 2021-09-10: qty 10

## 2021-09-10 NOTE — Assessment & Plan Note (Signed)
Current heavy period, goes for iron infusion later today. Previous Hb 6.8, recheck Hb.

## 2021-09-10 NOTE — Progress Notes (Signed)
   SUBJECTIVE:   CHIEF COMPLAINT / HPI:   CYST - had LE doppler US 10/7 for LE edema, negative for DVT but with cyst, probable Baker's. - with some pain.  - no falls, trauma - played soccer a lot growing up but no known prior injuries - no known underlying arthritis.  - no fevers, redness - no missed doses of xarelto  ANEMIA - last infusion last week - LMP 09/08/21, heavy period currently.  - gets infusion later today.  - last Hb 6.8   OBJECTIVE:   BP 122/80   Pulse 99   Temp 97.6 F (36.4 C) (Oral)   Resp 18   Ht 5\' 8"  (1.727 m)   Wt (!) 319 lb 14.4 oz (145.1 kg)   SpO2 99%   BMI 48.64 kg/m   Gen: well appearing, in NAD Card: Reg rate Lungs: Comfortable WOB on RA Ext: WWP, no edema   Limited ultrasound of L popliteal fossa: Minimal fluid within suprapatellar pouch. Bilateral menisci without appreciable tears, adequate joint space. No tendinopathy appreciated. ~2x3cm well defined anechoic area in lateral head of gastroc muscle without surrounding hypervascularity. Popliteal vein completely compressible and with doppler flow.  Impression: no popliteal DVT. Cyst in lateral popliteal fossa.  ASSESSMENT/PLAN:   Iron deficiency anemia due to chronic blood loss Current heavy period, goes for iron infusion later today. Previous Hb 6.8, recheck Hb.  Cyst Moderate sized fluid collection in lateral calf, not typical location for Baker's cyst and doesn't appear to communicate with the joint. No surrounding hypervascularity or local symptoms to suggest abscess. Question hematoma given chronic anticoagulation though no history of trauma. Popliteal vein compressible and formal doppler US negative for DVT. Refer to Sports Medicine for further evaluation given intramuscular location and questionable Baker's cyst.   Myles Gip, DO

## 2021-09-17 ENCOUNTER — Encounter: Payer: 59 | Admitting: Family Medicine

## 2021-09-23 ENCOUNTER — Ambulatory Visit
Admission: RE | Admit: 2021-09-23 | Discharge: 2021-09-23 | Disposition: A | Discharge status: Home / Self Care | Payer: 59 | Source: Ambulatory Visit | Attending: Family Medicine | Admitting: Family Medicine

## 2021-09-23 ENCOUNTER — Inpatient Hospital Stay: Payer: Self-pay | Admitting: Radiology

## 2021-09-23 ENCOUNTER — Ambulatory Visit: Payer: 59 | Admitting: Family Medicine

## 2021-09-23 ENCOUNTER — Inpatient Hospital Stay: Payer: 59 | Attending: Internal Medicine

## 2021-09-23 ENCOUNTER — Ambulatory Visit
Admission: RE | Admit: 2021-09-23 | Discharge: 2021-09-23 | Disposition: A | Discharge status: Home / Self Care | Payer: 59 | Attending: Family Medicine | Admitting: Family Medicine

## 2021-09-23 ENCOUNTER — Other Ambulatory Visit: Payer: Self-pay

## 2021-09-23 ENCOUNTER — Encounter: Payer: Self-pay | Admitting: Family Medicine

## 2021-09-23 ENCOUNTER — Inpatient Hospital Stay: Payer: 59

## 2021-09-23 VITALS — BP 131/84 | HR 68

## 2021-09-23 VITALS — BP 118/82 | HR 67 | Ht 68.0 in | Wt 326.0 lb

## 2021-09-23 DIAGNOSIS — D500 Iron deficiency anemia secondary to blood loss (chronic): Secondary | ICD-10-CM

## 2021-09-23 DIAGNOSIS — M7122 Synovial cyst of popliteal space [Baker], left knee: Secondary | ICD-10-CM | POA: Insufficient documentation

## 2021-09-23 DIAGNOSIS — E538 Deficiency of other specified B group vitamins: Secondary | ICD-10-CM

## 2021-09-23 DIAGNOSIS — M25562 Pain in left knee: Secondary | ICD-10-CM | POA: Insufficient documentation

## 2021-09-23 LAB — CBC WITH DIFFERENTIAL/PLATELET
Abs Immature Granulocytes: 0.01 10*3/uL (ref 0.00–0.07)
Basophils Absolute: 0 10*3/uL (ref 0.0–0.1)
Basophils Relative: 1 %
Eosinophils Absolute: 0.2 10*3/uL (ref 0.0–0.5)
Eosinophils Relative: 4 %
HCT: 34.7 % — ABNORMAL LOW (ref 36.0–46.0)
Hemoglobin: 10.6 g/dL — ABNORMAL LOW (ref 12.0–15.0)
Immature Granulocytes: 0 %
Lymphocytes Relative: 30 %
Lymphs Abs: 1.5 10*3/uL (ref 0.7–4.0)
MCH: 25.9 pg — ABNORMAL LOW (ref 26.0–34.0)
MCHC: 30.5 g/dL (ref 30.0–36.0)
MCV: 84.6 fL (ref 80.0–100.0)
Monocytes Absolute: 0.4 10*3/uL (ref 0.1–1.0)
Monocytes Relative: 7 %
Neutro Abs: 3 10*3/uL (ref 1.7–7.7)
Neutrophils Relative %: 58 %
Platelets: 245 10*3/uL (ref 150–400)
RBC: 4.1 MIL/uL (ref 3.87–5.11)
RDW: 23.5 % — ABNORMAL HIGH (ref 11.5–15.5)
WBC: 5.2 10*3/uL (ref 4.0–10.5)
nRBC: 0 % (ref 0.0–0.2)

## 2021-09-23 MED ORDER — SODIUM CHLORIDE 0.9 % IV SOLN
Freq: Once | INTRAVENOUS | Status: AC
  Filled 2021-09-23: qty 250

## 2021-09-23 MED ORDER — IRON SUCROSE 20 MG/ML IV SOLN
200.0000 mg | Freq: Once | INTRAVENOUS | Status: AC
  Administered 2021-09-23: 200 mg via INTRAVENOUS
  Filled 2021-09-23: qty 10

## 2021-09-23 MED ORDER — TRIAMCINOLONE ACETONIDE 40 MG/ML IJ SUSP
40.0000 mg | Freq: Once | INTRAMUSCULAR | Status: AC
  Administered 2021-09-23: 40 mg

## 2021-09-23 NOTE — Assessment & Plan Note (Signed)
See additional assessment(s) for plan details. 

## 2021-09-23 NOTE — Assessment & Plan Note (Signed)
Patient with acute on chronic left knee diffuse pain.  She does have a history of factor V Leiden mutation and history of DVT, roughly 1 month prior she noted left posterior knee pain, radiation to the calf, swelling.  A venous ultrasound was ordered given her history and clinical picture, negative for DVT but did reveal concern for popliteal cyst.  Of note she has been experiencing knee pain which has not resolved since onset, no trauma or change in activity, pain aggravated by prolonged standing/weightbearing, alleviated with acetaminophen and ice.  Examination today of the left knee reveals no discrete effusion, no popliteal mass, tenderness at the quadriceps tendon at the superior patella, medial slightly greater than lateral joint line tenderness, medial hamstring tendons distally are mildly tender, no pes anserine bursitis or tenderness, no laxity with AP and valgus/varus stressing, McMurray's benign.  Real-time sonographic bedside ultrasound performed revealing popliteal cyst, indication noted to the posterior joint.  Given her stated clinical history and objective findings today I did review various treatment strategies and the relation between popliteal cyst and intra-articular pathology.  She did elect to proceed with ultrasound-guided aspiration of the left popliteal cyst.  We additionally proceeded with intra-articular left knee corticosteroid injection.  She tolerated both procedures well, post care reviewed, and I have advised new x-rays in effort to seek authorization for viscosupplementation given her longstanding symptomatology which has previously been treated with corticosteroid alone.  We will have the patient return in 3 months for reevaluation, can see her sooner if viscosupplementation authorized accordingly.

## 2021-09-23 NOTE — Progress Notes (Signed)
Primary Care / Sports Medicine Office Visit  Patient Information:  Patient ID: JANARA KLETT, female DOB: 09-Dec-1980 Age: 40 y.o. MRN: [id number]   BALEIGH RENNAKER is a pleasant 40 y.o. female presenting with the following:  Chief Complaint  Patient presents with   New Patient (Initial Visit)   Knee Pain    Left; about a month ago; [place] on 08/29/21 that showed possible Baker's cyst; describes as stiffness, tightness, achy, sore; history of DVT; has done PT in the past for similar symptoms; 1/10 pain    Review of Systems pertinent details above   Patient Active Problem List   Diagnosis Date Noted   Arthralgia of knee, left 09/23/2021   Popliteal cyst, left 09/23/2021   Adverse reaction to COVID-19 vaccine 01/28/2021   Pulmonary embolism ([place]) 01/21/2021   Shortness of breath 01/20/2021   Acute pulmonary embolus ([place]) 01/20/2021   DVT (deep venous thrombosis) ([place]) 01/20/2021   Acute pulmonary embolism ([place]) 01/20/2021   Infertility, female 11/26/2020   PCOS (polycystic ovarian syndrome) 11/26/2020   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/25/2020   Intramural and submucous leiomyoma of uterus 08/31/2019   Menorrhagia with irregular cycle 08/31/2019   Dysmenorrhea 08/31/2019   B12 deficiency 08/09/2019   Iron deficiency anemia due to chronic blood loss 06/22/2019   Acute deep vein thrombosis (DVT) of calf muscle vein of right lower extremity ([place]) 04/18/2019   Factor V Leiden mutation ([place])    Allergic rhinitis 03/01/2019   Kidney stone 08/02/2018   Asthma, mild intermittent 08/02/2018   Migraine headache with aura 08/02/2018   Morbid obesity ([place]) 08/02/2018   Past Medical History:  Diagnosis Date   Asthma    Bone spur    Cholelithiasis 11/24/9415   Complication of anesthesia    pt had hypotensive episode and has had n & v   DVT (deep venous thrombosis) ([place]) 03/2019   x 1 and DEC 2011   Factor 5 Leiden mutation, heterozygous ([place])    stopped Xarelto 08/23/19    Fibroid    History of kidney stones    IDA (iron deficiency anemia)    vitamin b12 and iron deficiency   Migraines    PCOS (polycystic ovarian syndrome)    PCOS (polycystic ovarian syndrome)    Outpatient Encounter Medications as of 09/23/2021  Medication Sig   albuterol (PROVENTIL) (2.5 MG/3ML) 0.083% nebulizer solution Take 3 mLs (2.5 mg total) by nebulization every 6 (six) hours as needed for wheezing or shortness of breath.   albuterol (VENTOLIN HFA) 108 (90 Base) MCG/ACT inhaler Inhale 2 puffs into the lungs every 4 (four) hours as needed for wheezing or shortness of breath.   ferrous sulfate 325 (65 FE) MG tablet Take 325 mg by mouth daily.   fluticasone (FLOVENT HFA) 110 MCG/ACT inhaler Inhale 1 puff into the lungs 2 (two) times daily.   Multiple Vitamins-Minerals (MULTIVITAMIN WITH MINERALS) tablet Take 1 tablet by mouth daily.    ondansetron (ZOFRAN ODT) 4 MG disintegrating tablet Take 1 tablet (4 mg total) by mouth every 6 (six) hours as needed for nausea.   promethazine (PHENERGAN) 25 MG tablet Take 1 tablet (25 mg total) by mouth every 8 (eight) hours as needed for nausea or vomiting.   rivaroxaban (XARELTO) 20 MG TABS tablet Take 1 tablet (20 mg total) by mouth daily with supper.   Spacer/Aero-Holding Chambers (AEROCHAMBER PLUS) inhaler Use as instructed   vitamin C (ASCORBIC ACID) 500 MG tablet Take 500 mg by mouth  daily.   [DISCONTINUED] sulfamethoxazole-trimethoprim (BACTRIM DS) 800-160 MG tablet Take 1 tablet by mouth 2 (two) times daily.   [EXPIRED] triamcinolone acetonide (KENALOG-40) injection 40 mg    No facility-administered encounter medications on file as of 09/23/2021.   Past Surgical History:  Procedure Laterality Date   CHOLECYSTECTOMY N/A 07/28/2019   Procedure: LAPAROSCOPIC CHOLECYSTECTOMY;  Surgeon: Jules Husbands, MD;  Location: [facility];  Service: General;  Laterality: N/A;   HYSTEROSCOPY WITH D & C N/A 08/31/2019   Procedure: DILATATION AND CURETTAGE  /HYSTEROSCOPY/ SUBMUCOSAL MYOMECTOMY;  Surgeon: Will Bonnet, MD;  Location: [facility];  Service: Gynecology;  Laterality: N/A;   HYSTEROSCOPY WITH D & C N/A 10/03/2019   Procedure: DILATATION AND CURETTAGE /HYSTEROSCOPY/ SUBMUCOSAL MYOMECTOMY;  Surgeon: Will Bonnet, MD;  Location: [facility];  Service: Gynecology;  Laterality: N/A;   HYSTEROSCOPY WITH D & C N/A 10/26/2019   Procedure: DILATATION AND CURETTAGE /HYSTEROSCOPY, SUBMUCOSAL MYOMECTOMY;  Surgeon: Will Bonnet, MD;  Location: [facility];  Service: Gynecology;  Laterality: N/A;   MYOMECTOMY     PULMONARY THROMBECTOMY N/A 01/21/2021   Procedure: PULMONARY THROMBECTOMY;  Surgeon: Katha Cabal, MD;  Location: [place] [facility];  Service: Cardiovascular;  Laterality: N/A;   TONSILLECTOMY     WISDOM TOOTH EXTRACTION     WRIST SURGERY     age 72 - rebroke it b/c it grew back wrong    Vitals:   09/23/21 0806  BP: 118/82  Pulse: 67  SpO2: 97%   Vitals:   09/23/21 0806  Weight: (!) 326 lb (147.9 kg)  Height: 5\' 8"  (1.727 m)   Body mass index is 49.57 kg/m.  US Venous Img Lower Unilateral Left  Result Date: 08/29/2021 CLINICAL DATA:  Left lower extremity edema EXAM: LEFT LOWER EXTREMITY VENOUS DOPPLER ULTRASOUND TECHNIQUE: Gray-scale sonography with compression, as well as color and duplex ultrasound, were performed to evaluate the deep venous system(s) from the level of the common femoral vein through the popliteal and proximal calf veins. COMPARISON:  None. FINDINGS: VENOUS Normal compressibility of the left common femoral, superficial femoral, and popliteal veins, as well as the visualized calf veins. Visualized portions of profunda femoral vein and great saphenous vein unremarkable. No filling defects to suggest DVT on grayscale or color Doppler imaging. Doppler waveforms show normal direction of venous flow, normal respiratory plasticity and response to augmentation. Limited views of the contralateral common  femoral vein are unremarkable. OTHER Fluid collection of the left popliteal fossa measuring 3.7 x 1.2 x 2.5 cm, likely Baker's cyst. Limitations: none IMPRESSION: No evidence of left lower extremity DVT. Probable Baker's cyst of the left popliteal fossa. Electronically Signed   By: Yetta Glassman M.D.   On: 08/29/2021 17:28     Independent interpretation of notes and tests performed by another provider:   Independent interpretation of 08/29/2021 ultrasound venous lower unilateral left reviewed revealing longitudinal left popliteal fossa and transverse left popliteal fossa views showing hypoechoic ovoid region consistent with popliteal cyst with 1 view showing clear communication to the posterior joint.  Procedures performed:   Procedure: Aspiration of left popliteal cyst under ultrasound guidance. Ultrasound guidance utilized for in plane approach to communicating popliteal cyst, no evident vascular structures in the needle path, confirmation of cyst resolution after aspiration noted Samsung HS60 device utilized with permanent recording / reporting. Consent obtained and verified. Skin prepped in a sterile fashion. Ethyl chloride spray for topical local analgesia.  Completed without difficulty and tolerated well. Advised to  contact for fevers/chills, erythema, induration, drainage, or persistent bleeding.  Procedure:  Injection of left knee intra-articularly under ultrasound guidance. Ultrasound guidance utilized for out of plane anterolateral approach, visualization of joint line, confirmation of injectate Samsung HS60 device utilized with permanent recording / reporting. Consent obtained and verified. Skin prepped in a sterile fashion. Ethyl chloride spray for topical local analgesia.  Completed without difficulty and tolerated well. Medication: triamcinolone acetonide 40 mg/mL suspension for injection 1 mL total and 2 mL lidocaine 1% without epinephrine utilized for needle placement  anesthetic Advised to contact for fevers/chills, erythema, induration, drainage, or persistent bleeding.   Pertinent History, Exam, Impression, and Recommendations:   Arthralgia of knee, left Patient with acute on chronic left knee diffuse pain.  She does have a history of factor V Leiden mutation and history of DVT, roughly 1 month prior she noted left posterior knee pain, radiation to the calf, swelling.  A venous ultrasound was ordered given her history and clinical picture, negative for DVT but did reveal concern for popliteal cyst.  Of note she has been experiencing knee pain which has not resolved since onset, no trauma or change in activity, pain aggravated by prolonged standing/weightbearing, alleviated with acetaminophen and ice.  Examination today of the left knee reveals no discrete effusion, no popliteal mass, tenderness at the quadriceps tendon at the superior patella, medial slightly greater than lateral joint line tenderness, medial hamstring tendons distally are mildly tender, no pes anserine bursitis or tenderness, no laxity with AP and valgus/varus stressing, McMurray's benign.  Real-time sonographic bedside ultrasound performed revealing popliteal cyst, indication noted to the posterior joint.  Given her stated clinical history and objective findings today I did review various treatment strategies and the relation between popliteal cyst and intra-articular pathology.  She did elect to proceed with ultrasound-guided aspiration of the left popliteal cyst.  We additionally proceeded with intra-articular left knee corticosteroid injection.  She tolerated both procedures well, post care reviewed, and I have advised new x-rays in effort to seek authorization for viscosupplementation given her longstanding symptomatology which has previously been treated with corticosteroid alone.  We will have the patient return in 3 months for reevaluation, can see her sooner if viscosupplementation  authorized accordingly.  Popliteal cyst, left See additional assessment(s) for plan details.    Orders & Medications Meds ordered this encounter  Medications   triamcinolone acetonide (KENALOG-40) injection 40 mg   Orders Placed This Encounter  Procedures   [place] LIMITED JOINT SPACE STRUCTURES LOW LEFT   DG Knee Complete 4 Views Left     Return in about 3 months (around 12/24/2021).     Montel Culver, MD   Primary Care Sports Medicine [place]

## 2021-09-23 NOTE — Patient Instructions (Addendum)
You have just been given a cortisone injection to reduce pain and inflammation. After the injection you may notice immediate relief of pain as a result of the Lidocaine. It is important to rest the area of the injection for 24 to 48 hours after the injection. There is a possibility of some temporary increased discomfort and swelling for up to 72 hours until the cortisone begins to work. If you do have pain, simply rest the joint and use ice. If you can tolerate over the counter medications, you can try Tylenol for added relief per package instructions. - Relative rest as above then gradually return to usual activity - Obtain new x-rays downstairs to compare to the 07/23/2020 images - Return in 3 months - Contact for any questions

## 2021-09-24 ENCOUNTER — Encounter: Payer: 59 | Admitting: Family Medicine

## 2021-09-25 ENCOUNTER — Encounter: Payer: Self-pay | Admitting: Internal Medicine

## 2021-10-23 ENCOUNTER — Telehealth: Payer: Self-pay

## 2021-10-23 ENCOUNTER — Other Ambulatory Visit: Payer: Self-pay

## 2021-10-23 NOTE — Telephone Encounter (Signed)
Went into [employer] portal and send message to try to reopen case for patients Monovisc/ Orthovisc.  Awaiting response from company.

## 2021-10-24 ENCOUNTER — Telehealth: Payer: Self-pay

## 2021-10-24 NOTE — Telephone Encounter (Signed)
Completed PA on [email or web address] for Monovisc.  (Key: [id number])  Awaiting outcome.   Preferred Drugs for the patient were Euflexxa, Gelsyn3, or Supartz-Fx.

## 2021-10-24 NOTE — Telephone Encounter (Signed)
Completed PA on Gelsyn-3 ( Synvisc ) for patient.   (Key: [id number])  Awaiting outcome.

## 2021-11-10 ENCOUNTER — Ambulatory Visit: Payer: 59 | Admitting: Internal Medicine

## 2021-11-10 ENCOUNTER — Ambulatory Visit: Payer: 59

## 2021-11-10 ENCOUNTER — Other Ambulatory Visit: Payer: 59

## 2021-11-12 ENCOUNTER — Other Ambulatory Visit: Payer: Self-pay | Admitting: *Deleted

## 2021-11-12 DIAGNOSIS — D500 Iron deficiency anemia secondary to blood loss (chronic): Secondary | ICD-10-CM

## 2021-11-18 ENCOUNTER — Encounter: Payer: Self-pay | Admitting: Internal Medicine

## 2021-11-18 ENCOUNTER — Inpatient Hospital Stay: Payer: 59 | Attending: Internal Medicine

## 2021-11-18 ENCOUNTER — Inpatient Hospital Stay (HOSPITAL_BASED_OUTPATIENT_CLINIC_OR_DEPARTMENT_OTHER): Payer: 59 | Admitting: Internal Medicine

## 2021-11-18 ENCOUNTER — Other Ambulatory Visit: Payer: Self-pay

## 2021-11-18 ENCOUNTER — Ambulatory Visit
Admission: RE | Admit: 2021-11-18 | Discharge: 2021-11-18 | Disposition: A | Discharge status: Home / Self Care | Payer: 59 | Source: Ambulatory Visit | Attending: Internal Medicine | Admitting: Internal Medicine

## 2021-11-18 ENCOUNTER — Inpatient Hospital Stay: Payer: 59

## 2021-11-18 DIAGNOSIS — D500 Iron deficiency anemia secondary to blood loss (chronic): Secondary | ICD-10-CM

## 2021-11-18 DIAGNOSIS — I82461 Acute embolism and thrombosis of right calf muscular vein: Secondary | ICD-10-CM | POA: Insufficient documentation

## 2021-11-18 DIAGNOSIS — M7122 Synovial cyst of popliteal space [Baker], left knee: Secondary | ICD-10-CM | POA: Diagnosis not present

## 2021-11-18 DIAGNOSIS — Z7901 Long term (current) use of anticoagulants: Secondary | ICD-10-CM | POA: Insufficient documentation

## 2021-11-18 DIAGNOSIS — D509 Iron deficiency anemia, unspecified: Secondary | ICD-10-CM | POA: Diagnosis not present

## 2021-11-18 DIAGNOSIS — D6851 Activated protein C resistance: Secondary | ICD-10-CM | POA: Diagnosis present

## 2021-11-18 DIAGNOSIS — Z86718 Personal history of other venous thrombosis and embolism: Secondary | ICD-10-CM | POA: Insufficient documentation

## 2021-11-18 DIAGNOSIS — E538 Deficiency of other specified B group vitamins: Secondary | ICD-10-CM

## 2021-11-18 LAB — CBC WITH DIFFERENTIAL/PLATELET
Abs Immature Granulocytes: 0.02 10*3/uL (ref 0.00–0.07)
Basophils Absolute: 0 10*3/uL (ref 0.0–0.1)
Basophils Relative: 0 %
Eosinophils Absolute: 0.1 10*3/uL (ref 0.0–0.5)
Eosinophils Relative: 3 %
HCT: 37.9 % (ref 36.0–46.0)
Hemoglobin: 11.8 g/dL — ABNORMAL LOW (ref 12.0–15.0)
Immature Granulocytes: 0 %
Lymphocytes Relative: 29 %
Lymphs Abs: 1.4 10*3/uL (ref 0.7–4.0)
MCH: 26.8 pg (ref 26.0–34.0)
MCHC: 31.1 g/dL (ref 30.0–36.0)
MCV: 86.1 fL (ref 80.0–100.0)
Monocytes Absolute: 0.2 10*3/uL (ref 0.1–1.0)
Monocytes Relative: 5 %
Neutro Abs: 2.9 10*3/uL (ref 1.7–7.7)
Neutrophils Relative %: 63 %
Platelets: 233 10*3/uL (ref 150–400)
RBC: 4.4 MIL/uL (ref 3.87–5.11)
RDW: 14.2 % (ref 11.5–15.5)
WBC: 4.7 10*3/uL (ref 4.0–10.5)
nRBC: 0 % (ref 0.0–0.2)

## 2021-11-18 LAB — IRON AND TIBC
Iron: 153 ug/dL (ref 28–170)
Saturation Ratios: 32 % — ABNORMAL HIGH (ref 10.4–31.8)
TIBC: 475 ug/dL — ABNORMAL HIGH (ref 250–450)
UIBC: 322 ug/dL

## 2021-11-18 LAB — BASIC METABOLIC PANEL
Anion gap: 8 (ref 5–15)
BUN: 13 mg/dL (ref 6–20)
CO2: 27 mmol/L (ref 22–32)
Calcium: 8.7 mg/dL — ABNORMAL LOW (ref 8.9–10.3)
Chloride: 99 mmol/L (ref 98–111)
Creatinine, Ser: 0.73 mg/dL (ref 0.44–1.00)
GFR, Estimated: >60 mL/min (ref >60)
Glucose, Bld: 121 mg/dL — ABNORMAL HIGH (ref 70–99)
Potassium: 3.9 mmol/L (ref 3.5–5.1)
Sodium: 134 mmol/L — ABNORMAL LOW (ref 135–145)

## 2021-11-18 LAB — FERRITIN: Ferritin: 16 ng/mL (ref 11–307)

## 2021-11-18 MED ORDER — SODIUM CHLORIDE 0.9 % IV SOLN
Freq: Once | INTRAVENOUS | Status: AC
  Filled 2021-11-18: qty 250

## 2021-11-18 MED ORDER — IRON SUCROSE 20 MG/ML IV SOLN
200.0000 mg | Freq: Once | INTRAVENOUS | Status: AC
  Administered 2021-11-18: 14:00:00 200 mg via INTRAVENOUS
  Filled 2021-11-18: qty 10

## 2021-11-18 NOTE — Assessment & Plan Note (Addendum)
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  Compliance]. Currently on xalreto indefinitely.  See discussion below regarding left lower extremity discomfort-clinically suspicious for any recurrent DVT.-See below  # Patient currently on Xarelto.  Tolerating well.    #Left lower extremity discomfort-clinically suspicious of chronic postphlebitic syndrome.  However history of left LE baker cyst- s/p drain; and s/p steroid.  Recommend repeating ultrasound stat.  Also discussed regarding compression stockings.  # Iron deficient anemia- s/p myomectomy; hemoglobin 6.8 recommend IV iron infusion- venofer today; Hb today- 11 proceed with Venofer.  # DISPOSITION:  # [place] Left LE STAT  # Venofer today # as planned follow up in March 2023- months; MD- labs-cbc/-bmp; possible Venofer/-Dr.B  Cc; Rumbal, Alisson

## 2021-11-18 NOTE — Progress Notes (Signed)
[place] NOTE  Patient Care Team: [place] as PCP - General (Family Medicine)  CHIEF COMPLAINTS/PURPOSE OF CONSULTATION: DVT/PE  # provoked DVT in 2011 [lesser saphenous vein left lower extremity/below calf; BCP/long car ride x 6 months anticoagulation]; Apr 07, 2019-unprovoked right lower extremity DVT nonocclusive-Xarelto x17m; October 2020-factor V Leiden heterozygous; Xarelto 10 mg/day.  Mar 23, 2020-recommend Lovenox 120 mg twice daily [plans to get pregnant but ? compliance]; FEB 28th, 2022-bilateral PE status post thrombectomy [Dr.Schneir]; left lower extremity DVT-stop Lovenox; switch over to Xarelto.   #Hypercoagulable state-2011?  Factor V Leiden heterozygosity [as per patient; [facility]]-no records  #June 2020-anemia likely iron deficiency/menstrual periods.  Status post myomectomy-improved.  Oncology History   No history exists.    HISTORY OF PRESENTING ILLNESS:  Tara Shea 40 y.o.  female history of recurrent DVT right lower extremity;PE/left lower extremity DVT factor V Leiden heterozygous on indefinite anticoagulation with is here for follow-up.    Patient had ultrasound in November 2022 that showed a popliteal cyst left lower extremity.  S/p evaluation with orthopedic-st/p drainage/steroids.  However again noted to have increasing swelling/discomfort in the left lower extremity.  Patient continues to be compliant with her Xarelto.  Denies any blood in stools or black or stools.  Her menstrual cycles are not as heavy as they used to be s/p myomectomy; however continues to have intermittent bleeding.  Patient denies any plans for conceiving.  Review of Systems  Constitutional:  Positive for malaise/fatigue. Negative for chills, diaphoresis, fever and weight loss.  HENT:  Negative for nosebleeds and sore throat.   Eyes:  Negative for double vision.  Respiratory:  Negative for cough, hemoptysis, sputum  production, shortness of breath and wheezing.   Cardiovascular:  Negative for chest pain, palpitations, orthopnea and leg swelling.  Gastrointestinal:  Negative for abdominal pain, blood in stool, constipation, diarrhea, heartburn, melena, nausea and vomiting.  Genitourinary:  Negative for dysuria, frequency and urgency.  Musculoskeletal:  Negative for back pain and joint pain.  Skin: Negative.  Negative for itching and rash.  Neurological:  Negative for dizziness, tingling, focal weakness, weakness and headaches.  Endo/Heme/Allergies:  Does not bruise/bleed easily.  Psychiatric/Behavioral:  Negative for depression. The patient is not nervous/anxious and does not have insomnia.     MEDICAL HISTORY:  Past Medical History:  Diagnosis Date   Asthma    Bone spur    Cholelithiasis 4/0/8144   Complication of anesthesia    pt had hypotensive episode and has had n & v   DVT (deep venous thrombosis) ([place]) 03/2019   x 1 and DEC 2011   Factor 5 Leiden mutation, heterozygous ([place])    stopped Xarelto 08/23/19   Fibroid    History of kidney stones    IDA (iron deficiency anemia)    vitamin b12 and iron deficiency   Migraines    PCOS (polycystic ovarian syndrome)    PCOS (polycystic ovarian syndrome)     SURGICAL HISTORY: Past Surgical History:  Procedure Laterality Date   CHOLECYSTECTOMY N/A 07/28/2019   Procedure: LAPAROSCOPIC CHOLECYSTECTOMY;  Surgeon: Jules Husbands, MD;  Location: [facility];  Service: General;  Laterality: N/A;   HYSTEROSCOPY WITH D & C N/A 08/31/2019   Procedure: DILATATION AND CURETTAGE /HYSTEROSCOPY/ SUBMUCOSAL MYOMECTOMY;  Surgeon: Will Bonnet, MD;  Location: [facility];  Service: Gynecology;  Laterality: N/A;   HYSTEROSCOPY WITH D & C N/A 10/03/2019   Procedure: DILATATION AND CURETTAGE /HYSTEROSCOPY/ SUBMUCOSAL MYOMECTOMY;  Surgeon: Will Bonnet, MD;  Location: [facility];  Service: Gynecology;  Laterality: N/A;   HYSTEROSCOPY WITH D & C N/A 10/26/2019    Procedure: DILATATION AND CURETTAGE /HYSTEROSCOPY, SUBMUCOSAL MYOMECTOMY;  Surgeon: Will Bonnet, MD;  Location: [facility];  Service: Gynecology;  Laterality: N/A;   MYOMECTOMY     PULMONARY THROMBECTOMY N/A 01/21/2021   Procedure: PULMONARY THROMBECTOMY;  Surgeon: Katha Cabal, MD;  Location: [facility];  Service: Cardiovascular;  Laterality: N/A;   TONSILLECTOMY     WISDOM TOOTH EXTRACTION     WRIST SURGERY     age 44 - rebroke it b/c it grew back wrong    SOCIAL HISTORY: Social History   Socioeconomic History   Marital status: Married    Spouse name: Emali Heyward   Number of children: 0   Years of education: 16   Highest education level: Bachelor's degree (e.g., BA, AB, BS)  Occupational History   Occupation: Programmer, multimedia: [employer]    Comment: ED    (works at [employer])  Tobacco Use   Smoking status: Never   Smokeless tobacco: Never  Vaping Use   Vaping Use: Never used  Substance and Sexual Activity   Alcohol use: Yes   Drug use: Never   Sexual activity: Yes    Partners: Male  Other Topics Concern   Not on file  Social History Narrative    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  No children.        Social Determinants of Health   Financial Resource Strain: Not on file  Food Insecurity: Not on file  Transportation Needs: Not on file  Physical Activity: Not on file  Stress: Not on file  Social Connections: Not on file  Intimate Partner Violence: Not on file    FAMILY HISTORY: Family History  Problem Relation Age of Onset   Heart murmur Mother    Thyroid disease Mother    Migraines Mother    Asthma Mother    Diabetes Mother        TYPE 2   Bipolar disorder Mother    Breast cancer Maternal Aunt 59   Cancer Maternal Aunt 14       colon   Diabetes Maternal Aunt    Cancer Maternal Uncle        skin   Cancer Maternal Grandmother 80       colon   Hypertension Maternal Grandmother    Heart attack Maternal Grandmother      ALLERGIES:  is allergic to covid-19 (mrna) vaccine.  MEDICATIONS:  Current Outpatient Medications  Medication Sig Dispense Refill   albuterol (PROVENTIL) (2.5 MG/3ML) 0.083% nebulizer solution Take 3 mLs (2.5 mg total) by nebulization every 6 (six) hours as needed for wheezing or shortness of breath. 75 mL 6   albuterol (VENTOLIN HFA) 108 (90 Base) MCG/ACT inhaler Inhale 2 puffs into the lungs every 4 (four) hours as needed for wheezing or shortness of breath. 8 g 0   ferrous sulfate 325 (65 FE) MG tablet Take 325 mg by mouth daily.     fluticasone (FLOVENT HFA) 110 MCG/ACT inhaler Inhale 1 puff into the lungs 2 (two) times daily. 12 g 0   Multiple Vitamins-Minerals (MULTIVITAMIN WITH MINERALS) tablet Take 1 tablet by mouth daily.      ondansetron (ZOFRAN ODT) 4 MG disintegrating tablet Take 1 tablet (4 mg total) by mouth every 6 (six) hours as needed for nausea. 60 tablet  1   promethazine (PHENERGAN) 25 MG tablet Take 1 tablet (25 mg total) by mouth every 8 (eight) hours as needed for nausea or vomiting. 60 tablet 1   rivaroxaban (XARELTO) 20 MG TABS tablet Take 1 tablet (20 mg total) by mouth daily with supper. 30 tablet 6   Spacer/Aero-Holding Chambers (AEROCHAMBER PLUS) inhaler Use as instructed 1 each 2   vitamin B-12 (CYANOCOBALAMIN) 100 MCG tablet Take 100 mcg by mouth daily.     vitamin C (ASCORBIC ACID) 500 MG tablet Take 500 mg by mouth daily.     No current facility-administered medications for this visit.      Marland Kitchen  PHYSICAL EXAMINATION:  Vitals:   11/18/21 1312  BP: (!) 137/94  Pulse: 79  Temp: 97.9 F (36.6 C)  SpO2: 100%   Filed Weights   11/18/21 1312  Weight: (!) 329 lb 3.2 oz (149.3 kg)    Physical Exam Constitutional:      Comments: Obese.  HENT:     Head: Normocephalic and atraumatic.     Mouth/Throat:     Pharynx: No oropharyngeal exudate.  Eyes:     Pupils: Pupils are equal, round, and reactive to light.  Cardiovascular:     Rate and Rhythm:  Normal rate and regular rhythm.  Pulmonary:     Effort: Pulmonary effort is normal. No respiratory distress.     Breath sounds: Normal breath sounds. No wheezing.  Abdominal:     General: Bowel sounds are normal. There is no distension.     Palpations: Abdomen is soft. There is no mass.     Tenderness: There is no abdominal tenderness. There is no guarding or rebound.  Musculoskeletal:        General: No tenderness. Normal range of motion.     Cervical back: Normal range of motion and neck supple.  Skin:    General: Skin is warm.  Neurological:     Mental Status: She is alert and oriented to person, place, and time.  Psychiatric:        Mood and Affect: Affect normal.    LABORATORY DATA:  I have reviewed the data as listed Lab Results  Component Value Date   WBC 4.7 11/18/2021   HGB 11.8 (L) 11/18/2021   HCT 37.9 11/18/2021   MCV 86.1 11/18/2021   PLT 233 11/18/2021   Recent Labs    01/22/21 0548 02/10/21 1425 08/13/21 1416 11/18/21 1304  NA 139 139 138 134*  K 3.8 4.1 4.1 3.9  CL 106 106 105 99  CO2 23 25 26 27   GLUCOSE 102* 109* 102* 121*  BUN 14 11 12 13   CREATININE 0.67 0.76 0.58 0.73  CALCIUM 8.9 8.9 8.8* 8.7*  GFRNONAA >60 >60 >60 >60  PROT 6.5 7.0  --   --   ALBUMIN 3.6 3.7  --   --   AST 13* 14*  --   --   ALT 11 13  --   --   ALKPHOS 56 51  --   --   BILITOT 0.5 0.3  --   --     RADIOGRAPHIC STUDIES: I have personally reviewed the radiological images as listed and agreed with the findings in the report. No results found.   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  Compliance]. Currently on xalreto indefinitely.  See discussion below regarding left lower extremity  discomfort-clinically suspicious for any recurrent DVT.-See below  # Patient currently on Xarelto.  Tolerating well.    #Left lower extremity discomfort-clinically suspicious of chronic postphlebitic syndrome.  However history of left LE baker cyst- s/p drain; and s/p steroid.  Recommend repeating ultrasound stat.  Also discussed regarding compression stockings.  # Iron deficient anemia- s/p myomectomy; hemoglobin 6.8 recommend IV iron infusion- venofer today; Hb today- 11 proceed with Venofer.  # DISPOSITION:  # [place] Left LE STAT  # Venofer today # as planned follow up in March 2023- months; MD- labs-cbc/-bmp; possible Venofer/-Dr.B  Cc; Rumbal, Alisson   All questions were answered. The patient knows to call the clinic with any problems, questions or concerns.     Cammie Sickle, MD 11/18/2021 3:35 PM

## 2021-11-18 NOTE — Progress Notes (Signed)
C/o left leg pain and swelling behind knee and ankle. Not sure if it's a cyst of clot.

## 2021-11-19 ENCOUNTER — Encounter: Payer: Self-pay | Admitting: Internal Medicine

## 2021-12-05 ENCOUNTER — Encounter: Payer: Self-pay | Admitting: Internal Medicine

## 2021-12-11 ENCOUNTER — Other Ambulatory Visit: Payer: Self-pay

## 2021-12-11 MED ORDER — CARESTART COVID-19 HOME TEST VI KIT
PACK | 0 refills | Status: DC
  Filled 2021-12-11: qty 2, 4d supply, fill #0

## 2021-12-24 ENCOUNTER — Ambulatory Visit: Payer: 59 | Admitting: Family Medicine

## 2021-12-31 IMAGING — US US EXTREM LOW VENOUS
1 series · 13 of 24 positions shown · non-contrast
Comparison: November 15, 2019.

CLINICAL DATA: Acute PE, shortness of breath.



[Series 1: us venous img lower bilat (dvt) · portal-venous · 13 of 64 slices shown]
[im 1/64]
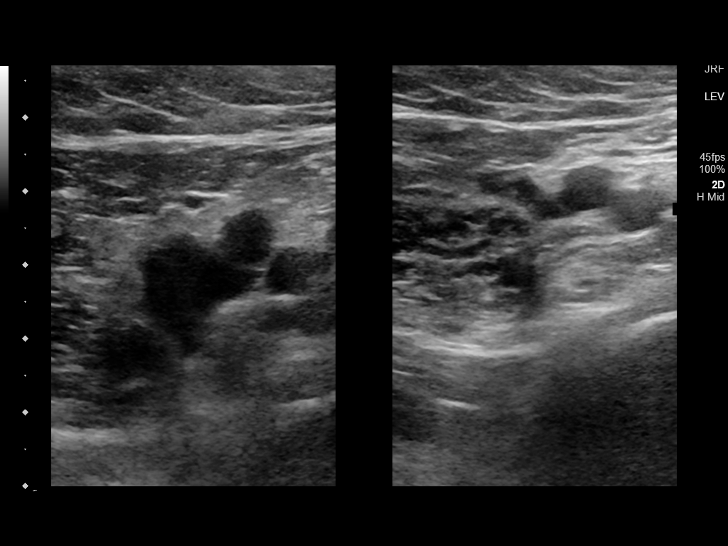
[im 6/64]
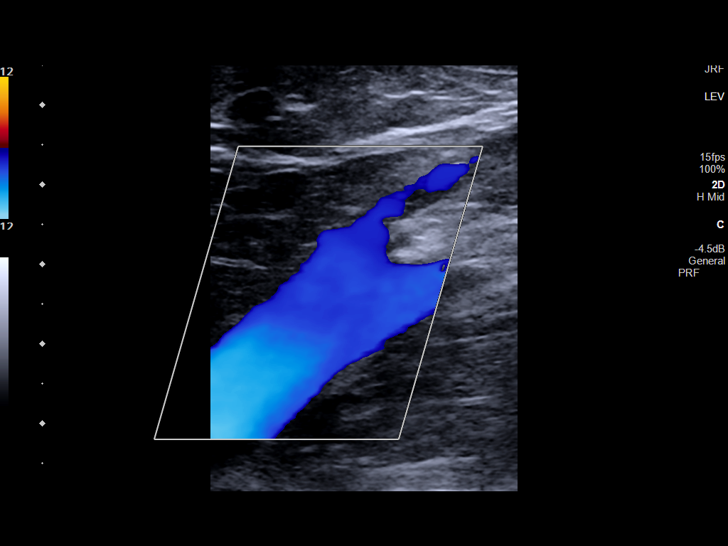
[im 11/64]
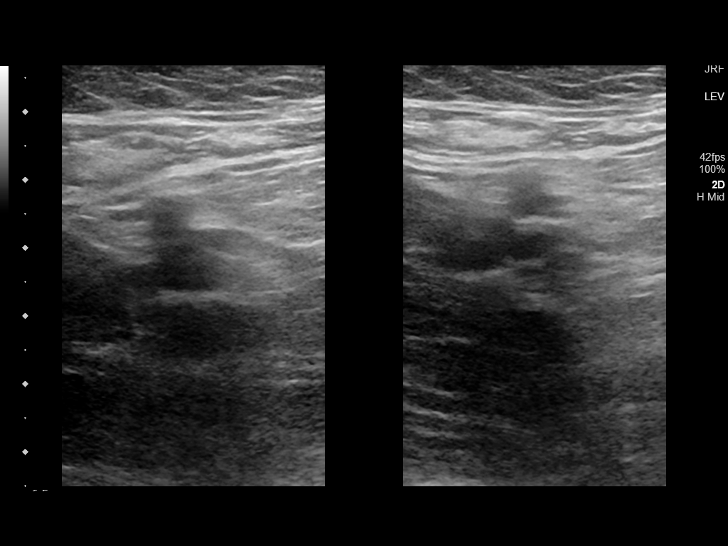
[im 17/64]
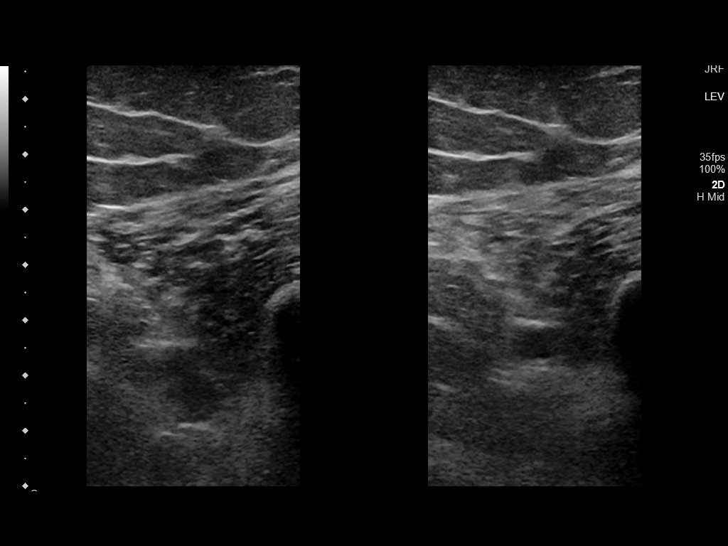
[im 22/64]
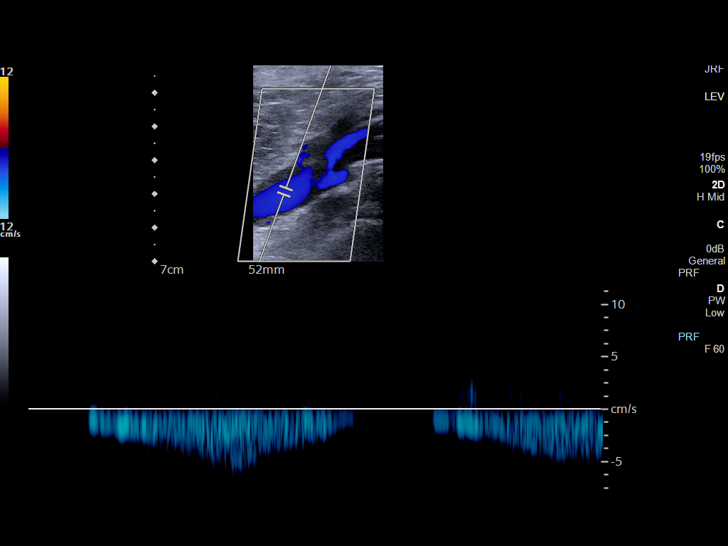
[im 28/64]
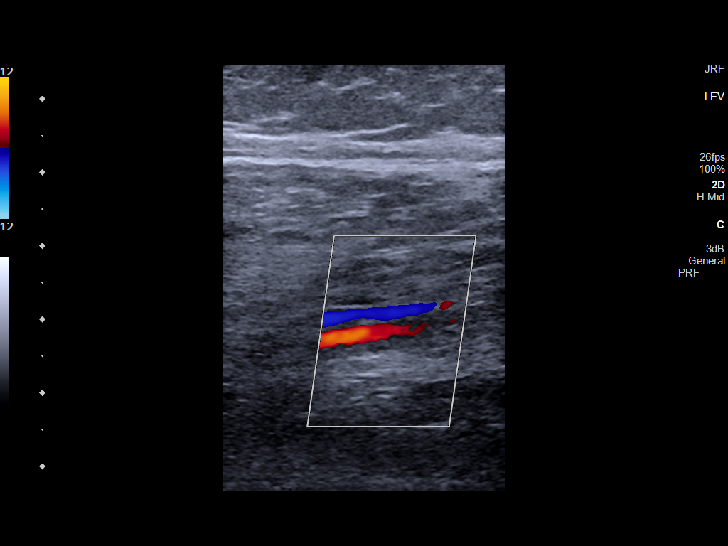
[im 33/64]
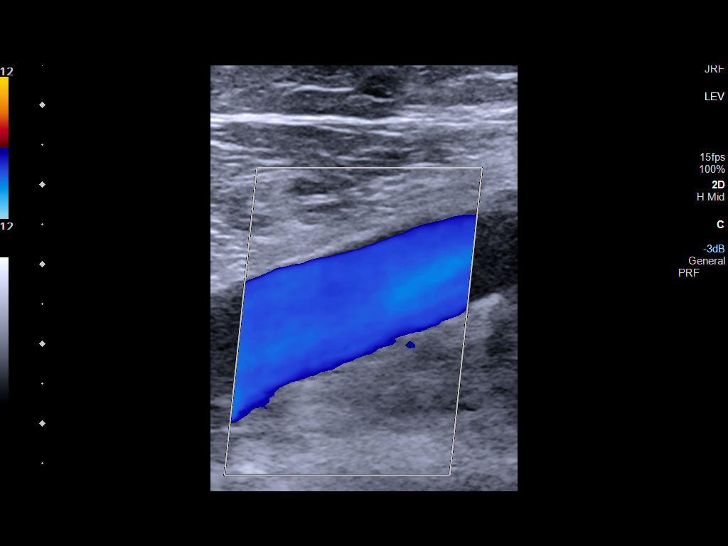
[im 36/64]
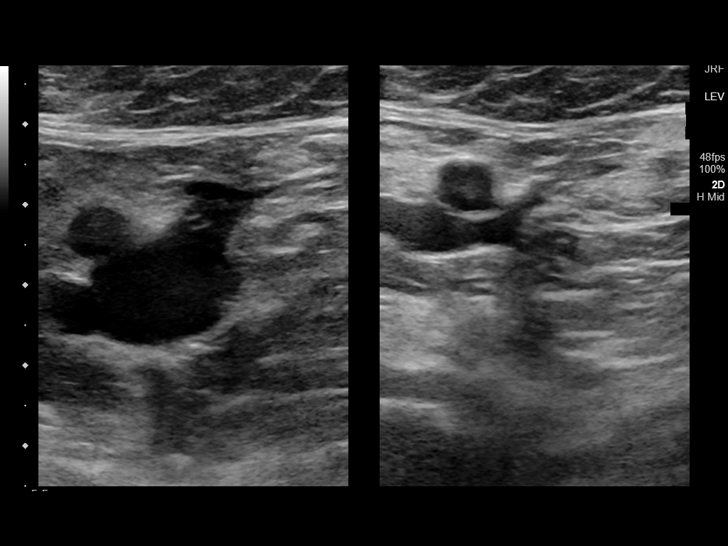
[im 42/64]
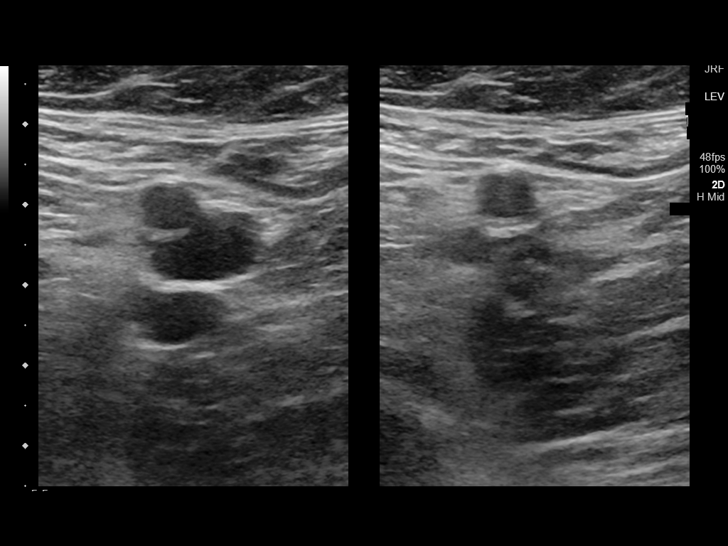
[im 47/64]
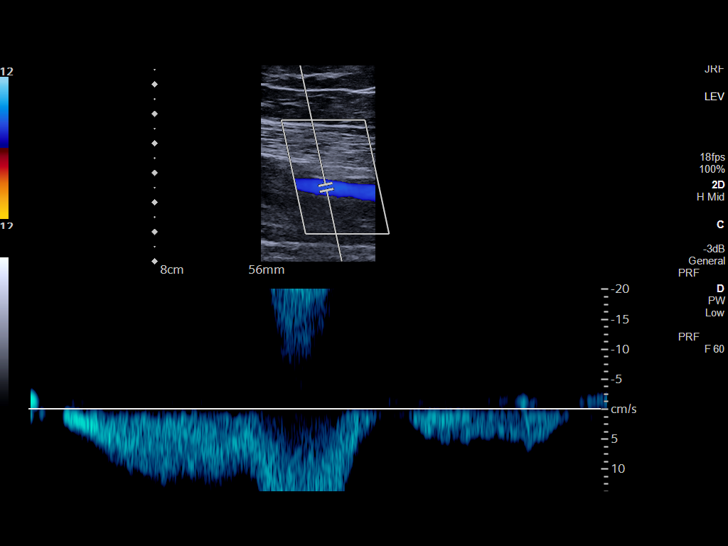
[im 53/64]
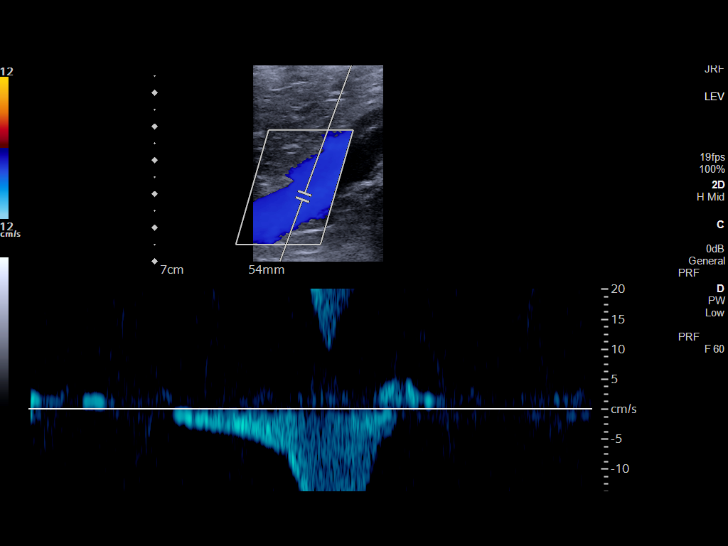
[im 58/64]
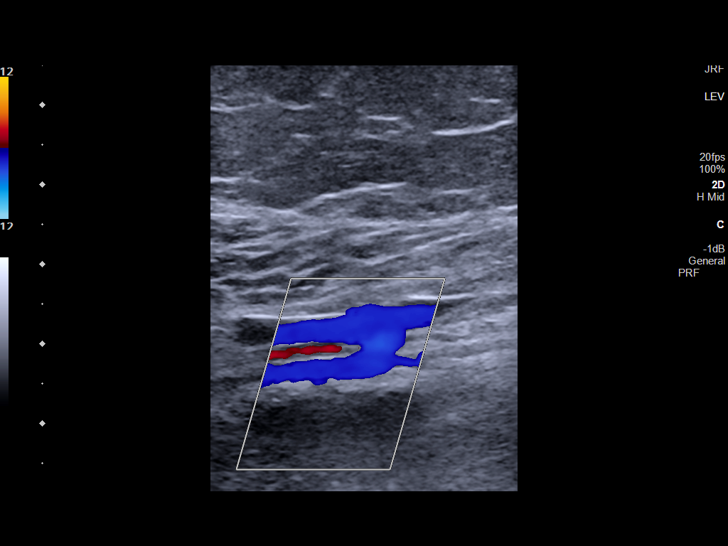
[im 64/64]
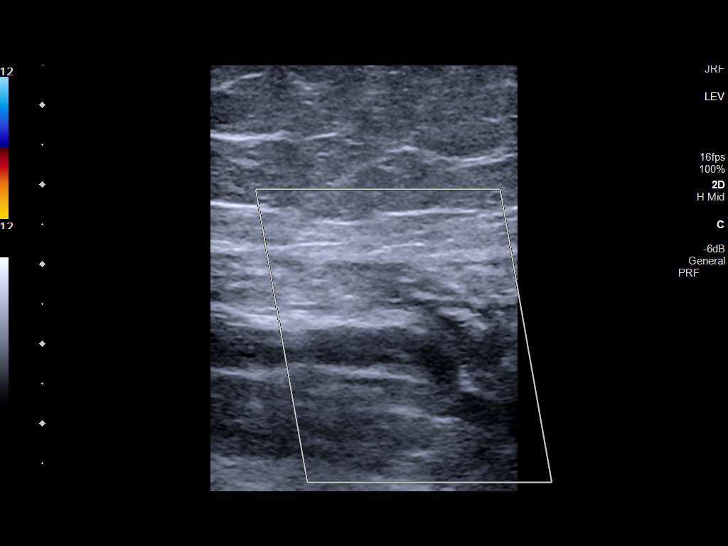

[13 of 24 positions shown; findings below may reference images not displayed]

FINDINGS: RIGHT LOWER EXTREMITY

Common Femoral Vein: No evidence of thrombus. Normal
compressibility, respiratory phasicity and response to augmentation.

Saphenofemoral Junction: No evidence of thrombus. Normal
compressibility and flow on color Doppler imaging.

Profunda Femoral Vein: No evidence of thrombus. Normal
compressibility and flow on color Doppler imaging.

Femoral Vein: No evidence of thrombus. Normal compressibility,
respiratory phasicity and response to augmentation.

Popliteal Vein: No evidence of thrombus. Normal compressibility,
respiratory phasicity and response to augmentation.

Calf Veins: No evidence of thrombus. Normal compressibility and flow
on color Doppler imaging.

Venous Reflux:  None.

LEFT LOWER EXTREMITY

Common Femoral Vein: No evidence of thrombus. Normal
compressibility, respiratory phasicity and response to augmentation.

Saphenofemoral Junction: No evidence of thrombus. Normal
compressibility and flow on color Doppler imaging.

Profunda Femoral Vein: No evidence of thrombus. Normal
compressibility and flow on color Doppler imaging.

Femoral Vein: No evidence of thrombus. Normal compressibility,
respiratory phasicity and response to augmentation.

Popliteal Vein: Nonocclusive thrombus.  Diminished compressibility.

Calf Veins: Occlusive thrombus involving the posterior tibial vein
and peroneal vein. Diminished compressibility.

Venous Reflux:  None.
IMPRESSION: Evidence of deep venous thrombosis involving the left popliteal
vein, posterior tibial vein, and peroneal veins.

These results will be called to the ordering clinician or
representative by the Radiologist Assistant, and communication
documented in the PACS or [REDACTED].

## 2022-01-29 ENCOUNTER — Telehealth: Payer: Self-pay | Admitting: *Deleted

## 2022-01-29 NOTE — Telephone Encounter (Signed)
-----   Message from Secundino Ginger sent at 01/29/2022  8:52 AM EST ----- ?Regarding: PRIOR AUTHORIZATION- [employer] ?PRIOR AUTHORIZATION- [employer] for XARELTO sent to chart. ? ?

## 2022-01-29 NOTE — Telephone Encounter (Signed)
PA required for xarelto ? ?Lysbeth Penner KeyDelaine Lame - PA Case ID: [id number] ? ?Approvedtoday ?[id number];Review Type:Prior Auth;Coverage Start Date:12/30/2021;Coverage End Date:01/29/2023 ? ?I contacted the pharmacy and left a detailed vm that script was approved by her insurance. ?

## 2022-02-06 ENCOUNTER — Telehealth: Payer: Self-pay

## 2022-02-06 DIAGNOSIS — I2699 Other pulmonary embolism without acute cor pulmonale: Secondary | ICD-10-CM

## 2022-02-06 NOTE — Telephone Encounter (Signed)
Copied from [place] [phone]. Topic: Referral - Request for Referral ?>> Feb 06, 2022 11:21 AM Alanda Slim E wrote: ?Has patient seen PCP for this complaint?No  ?*If NO, is insurance requiring patient see PCP for this issue before PCP can refer them? ?Referral for which specialty: Hemotology  ?Preferred provider/office: [facility] / fax# [phone] they would need pt demographics, clinical notes, lab work and medication list since they are with [facility]  ?Reason for referral: Pts prior hemotologist no longer takes her insurance / she has an appt with them next week and needs referral asap ?

## 2022-02-11 ENCOUNTER — Telehealth: Payer: Self-pay | Admitting: *Deleted

## 2022-02-11 ENCOUNTER — Ambulatory Visit: Payer: 59 | Admitting: Nurse Practitioner

## 2022-02-11 ENCOUNTER — Other Ambulatory Visit: Payer: 59

## 2022-02-11 NOTE — Telephone Encounter (Signed)
Patient called to cancel 3/29 appointments because she states the [place] is no longer in her insurance Network. Writer advised patient to call insurance to clarify change in tax status last January. She stated she would call to reschedule when insurance issue was resolved. Appointments have been canceled. ?

## 2022-02-13 NOTE — Telephone Encounter (Signed)
Looks like she wanted new referral placed to [facility] ?

## 2022-02-13 NOTE — Telephone Encounter (Signed)
Pt called in reference to this referral on last Friday. As of today she has not heard anything. Is it possible for you to contact patient today. She is needing this referral to go to [facility] due to them being in network with her insurance.

## 2022-02-17 ENCOUNTER — Ambulatory Visit: Payer: 59 | Admitting: Nurse Practitioner

## 2022-02-17 ENCOUNTER — Other Ambulatory Visit: Payer: 59

## 2022-02-19 ENCOUNTER — Encounter: Payer: Self-pay | Admitting: Internal Medicine

## 2022-03-09 ENCOUNTER — Encounter: Payer: Self-pay | Admitting: Family Medicine

## 2022-03-09 NOTE — Telephone Encounter (Signed)
Pt checked up on this referral request which was supposed to be sent to [facility],  pt wanted a call back from the referral coordinator. Please advise.  ?

## 2022-03-10 ENCOUNTER — Telehealth: Payer: Self-pay | Admitting: Internal Medicine

## 2022-03-10 ENCOUNTER — Other Ambulatory Visit: Payer: Self-pay

## 2022-03-10 DIAGNOSIS — I2699 Other pulmonary embolism without acute cor pulmonale: Secondary | ICD-10-CM

## 2022-03-10 NOTE — Telephone Encounter (Signed)
I called the patient to inform her that I am just waiting for a new referral to be placed so that I can send it to [facility] hematology as requested but there was no answer. A message was left for her and my direct line if she should have any questions. ?

## 2022-04-13 ENCOUNTER — Other Ambulatory Visit: Payer: Self-pay

## 2022-04-13 ENCOUNTER — Encounter: Payer: 59 | Admitting: Family Medicine

## 2022-04-13 ENCOUNTER — Other Ambulatory Visit: Payer: Self-pay | Admitting: Nurse Practitioner

## 2022-04-13 ENCOUNTER — Encounter: Payer: Self-pay | Admitting: Nurse Practitioner

## 2022-04-13 ENCOUNTER — Ambulatory Visit (INDEPENDENT_AMBULATORY_CARE_PROVIDER_SITE_OTHER): Payer: 59 | Admitting: Nurse Practitioner

## 2022-04-13 VITALS — BP 126/82 | HR 90 | Temp 98.2°F | Resp 18 | Ht 68.0 in | Wt 328.9 lb

## 2022-04-13 DIAGNOSIS — Z1159 Encounter for screening for other viral diseases: Secondary | ICD-10-CM

## 2022-04-13 DIAGNOSIS — Z1231 Encounter for screening mammogram for malignant neoplasm of breast: Secondary | ICD-10-CM | POA: Diagnosis not present

## 2022-04-13 DIAGNOSIS — Z0000 Encounter for general adult medical examination without abnormal findings: Secondary | ICD-10-CM

## 2022-04-13 DIAGNOSIS — R7989 Other specified abnormal findings of blood chemistry: Secondary | ICD-10-CM

## 2022-04-13 DIAGNOSIS — Z131 Encounter for screening for diabetes mellitus: Secondary | ICD-10-CM

## 2022-04-13 DIAGNOSIS — Z13220 Encounter for screening for lipoid disorders: Secondary | ICD-10-CM

## 2022-04-13 DIAGNOSIS — Z1211 Encounter for screening for malignant neoplasm of colon: Secondary | ICD-10-CM

## 2022-04-13 DIAGNOSIS — D229 Melanocytic nevi, unspecified: Secondary | ICD-10-CM

## 2022-04-13 DIAGNOSIS — J4520 Mild intermittent asthma, uncomplicated: Secondary | ICD-10-CM

## 2022-04-13 MED ORDER — ALBUTEROL SULFATE HFA 108 (90 BASE) MCG/ACT IN AERS: 2.0000 | INHALATION_SPRAY | RESPIRATORY_TRACT | 0 refills | Status: DC | PRN

## 2022-04-13 NOTE — Progress Notes (Signed)
Name: Tara Shea   MRN: [id number]    DOB: July 12, 1981   Date:04/13/2022       Progress Note  Subjective  Chief Complaint  Chief Complaint  Patient presents with   Annual Exam    HPI  Patient presents for annual CPE.  Diet: Protein shakes for breakfast, lunch she has a sandwich and fruit, dinner has vegetables and also enjoys junk food Exercise: goes to the gym about 2 times a week, cardio and weights Sleep: 4-6 hours  [place] Office Visit from 04/13/2022 in [facility]  AUDIT-C Score 0      Depression: Phq 9 is  negative    04/13/2022   11:49 AM 09/23/2021    8:14 AM 09/10/2021    8:23 AM 05/27/2021    9:28 AM 01/28/2021    9:22 AM  Depression screen PHQ 2/9  Decreased Interest 0 0 0 0 0  Down, Depressed, Hopeless 0 0 0 0 0  PHQ - 2 Score 0 0 0 0 0  Altered sleeping  0 0 0   Tired, decreased energy  0 0 0   Change in appetite  0 0 0   Feeling bad or failure about yourself   0 0 0   Trouble concentrating  0 0 0   Moving slowly or fidgety/restless  0 0 0   Suicidal thoughts  0 0 0   PHQ-9 Score  0 0 0   Difficult doing work/chores  Not difficult at all Not difficult at all Not difficult at all    Hypertension: BP Readings from Last 3 Encounters:  04/13/22 126/82  11/18/21 (!) 137/94  09/23/21 131/84   Obesity: Wt Readings from Last 3 Encounters:  04/13/22 (!) 328 lb 14.4 oz (149.2 kg)  11/18/21 (!) 329 lb 3.2 oz (149.3 kg)  09/23/21 (!) 326 lb (147.9 kg)   BMI Readings from Last 3 Encounters:  04/13/22 50.01 kg/m  11/18/21 50.05 kg/m  09/23/21 49.57 kg/m     Vaccines:  HPV: up to at age 6 , ask insurance if age between 65-45  Shingrix: 47-64 yo and ask insurance if covered when patient above 76 yo Pneumonia:  educated and discussed with patient. Flu:  educated and discussed with patient.  Hep C Screening: ordered STD testing and prevention (HIV/chl/gon/syphilis): 01/21/2021 Intimate partner violence:none Sexual  History :yes with one partner, husband Menstrual History/LMP/Abnormal Bleeding: [place]: 04/02/2022, regular sometimes lengthy she has fibroids and PCOS Incontinence Symptoms: none  Breast cancer:  - Last Mammogram: ordered - BRCA gene screening: aunt with breast cancer  Osteoporosis: Discussed high calcium and vitamin D supplementation, weight bearing exercises  Cervical cancer screening: 06/29/2018  Skin cancer: Discussed monitoring for atypical lesions referral placed to dermatology, pt has several moles that need to be evaluated Colorectal cancer: 1st degree relative with colon cancer in her 25s, referral placed for colonoscopy  Lung cancer:   Low Dose CT Chest recommended if Age 21-80 years, 20 pack-year currently smoking OR have quit w/in 15years. Patient does not qualify.   ECG: 01/22/2021  Advanced Care Planning: A voluntary discussion about advance care planning including the explanation and discussion of advance directives.  Discussed health care proxy and Living will. Patient does not have a living will at present time. If patient does have living will, I have requested they bring this to the clinic to be scanned in to their chart.  Lipids: Lab Results  Component Value Date   CHOL 184 08/02/2018  Lab Results  Component Value Date   HDL 64 08/02/2018   Lab Results  Component Value Date   LDLCALC 103 (H) 08/02/2018   Lab Results  Component Value Date   TRIG 76 08/02/2018   Lab Results  Component Value Date   CHOLHDL 2.9 08/02/2018   No results found for: LDLDIRECT  Glucose: Glucose  Date Value Ref Range Status  09/14/2013 106 (H) 65 - 99 mg/dL Final   Glucose, Bld  Date Value Ref Range Status  11/18/2021 121 (H) 70 - 99 mg/dL Final    Comment:    Glucose reference range applies only to samples taken after fasting for at least 8 hours.  08/13/2021 102 (H) 70 - 99 mg/dL Final    Comment:    Glucose reference range applies only to samples taken after fasting for at  least 8 hours.  02/10/2021 109 (H) 70 - 99 mg/dL Final    Comment:    Glucose reference range applies only to samples taken after fasting for at least 8 hours.   Glucose-Capillary  Date Value Ref Range Status  01/21/2021 87 70 - 99 mg/dL Final    Comment:    Glucose reference range applies only to samples taken after fasting for at least 8 hours.    Patient Active Problem List   Diagnosis Date Noted   Arthralgia of knee, left 09/23/2021   Popliteal cyst, left 09/23/2021   Adverse reaction to COVID-19 vaccine 01/28/2021   Pulmonary embolism ([place]) 01/21/2021   Shortness of breath 01/20/2021   Acute pulmonary embolus ([place]) 01/20/2021   DVT (deep venous thrombosis) ([place]) 01/20/2021   Acute pulmonary embolism ([place]) 01/20/2021   Infertility, female 11/26/2020   PCOS (polycystic ovarian syndrome) 11/26/2020   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/25/2020   Intramural and submucous leiomyoma of uterus 08/31/2019   Menorrhagia with irregular cycle 08/31/2019   Dysmenorrhea 08/31/2019   B12 deficiency 08/09/2019   Iron deficiency anemia due to chronic blood loss 06/22/2019   Acute deep vein thrombosis (DVT) of calf muscle vein of right lower extremity ([place]) 04/18/2019   Factor V Leiden mutation ([place])    Allergic rhinitis 03/01/2019   Kidney stone 08/02/2018   Asthma, mild intermittent 08/02/2018   Migraine headache with aura 08/02/2018   Morbid obesity ([place]) 08/02/2018    Past Surgical History:  Procedure Laterality Date   CHOLECYSTECTOMY N/A 07/28/2019   Procedure: LAPAROSCOPIC CHOLECYSTECTOMY;  Surgeon: Jules Husbands, MD;  Location: [facility] OR[facility];  Service: General;  Laterality: N/A;   HYSTEROSCOPY WITH D & C N/A 08/31/2019   Procedure: DILATATION AND CURETTAGE /HYSTEROSCOPY/ SUBMUCOSAL MYOMECTOMY;  Surgeon: Will Bonnet, MD;  Location: [facility] OR[facility];  Service: Gynecology;  Laterality: N/A;   HYSTEROSCOPY WITH D & C N/A 10/03/2019   Procedure: DILATATION AND CURETTAGE /HYSTEROSCOPY/ SUBMUCOSAL  MYOMECTOMY;  Surgeon: Will Bonnet, MD;  Location: [facility] OR[facility];  Service: Gynecology;  Laterality: N/A;   HYSTEROSCOPY WITH D & C N/A 10/26/2019   Procedure: DILATATION AND CURETTAGE /HYSTEROSCOPY, SUBMUCOSAL MYOMECTOMY;  Surgeon: Will Bonnet, MD;  Location: [facility] OR[facility];  Service: Gynecology;  Laterality: N/A;   MYOMECTOMY     PULMONARY THROMBECTOMY N/A 01/21/2021   Procedure: PULMONARY THROMBECTOMY;  Surgeon: Katha Cabal, MD;  Location: [facility];  Service: Cardiovascular;  Laterality: N/A;   TONSILLECTOMY     WISDOM TOOTH EXTRACTION     WRIST SURGERY     age 3 - rebroke it b/c it grew back wrong  Family History  Problem Relation Age of Onset   Heart murmur Mother    Thyroid disease Mother    Migraines Mother    Asthma Mother    Diabetes Mother        TYPE 2   Bipolar disorder Mother    Breast cancer Maternal Aunt 29   Cancer Maternal Aunt 60       colon   Diabetes Maternal Aunt    Cancer Maternal Uncle        skin   Cancer Maternal Grandmother 80       colon   Hypertension Maternal Grandmother    Heart attack Maternal Grandmother     Social History   Socioeconomic History   Marital status: Married    Spouse name: Arlie Posch   Number of children: 0   Years of education: 16   Highest education level: Bachelor's degree (e.g., BA, AB, BS)  Occupational History   Occupation: Programmer, multimedia: [employer]    Comment: ED    (works at [employer])  Tobacco Use   Smoking status: Never   Smokeless tobacco: Never  Vaping Use   Vaping Use: Never used  Substance and Sexual Activity   Alcohol use: Yes   Drug use: Never   Sexual activity: Yes    Partners: Male  Other Topics Concern   Not on file  Social History Narrative    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  No children.        Social Determinants of Health   Financial Resource Strain: Not on file  Food Insecurity: Not on file  Transportation Needs: Not on file  Physical Activity:  Not on file  Stress: Not on file  Social Connections: Not on file  Intimate Partner Violence: Not on file     Current Outpatient Medications:    albuterol (PROVENTIL) (2.5 MG/3ML) 0.083% nebulizer solution, Take 3 mLs (2.5 mg total) by nebulization every 6 (six) hours as needed for wheezing or shortness of breath., Disp: 75 mL, Rfl: 6   albuterol (VENTOLIN HFA) 108 (90 Base) MCG/ACT inhaler, Inhale 2 puffs into the lungs every 4 (four) hours as needed for wheezing or shortness of breath., Disp: 8 g, Rfl: 0   apixaban (ELIQUIS) 5 MG TABS tablet, Take by mouth., Disp: , Rfl:    COVID-19 At Home Antigen Test (CARESTART COVID-19 HOME TEST) KIT, Use as directed, Disp: 2 kit, Rfl: 0   ferrous sulfate 325 (65 FE) MG tablet, Take 325 mg by mouth daily., Disp: , Rfl:    fluticasone (FLOVENT HFA) 110 MCG/ACT inhaler, Inhale 1 puff into the lungs 2 (two) times daily., Disp: 12 g, Rfl: 0   Multiple Vitamins-Minerals (MULTIVITAMIN WITH MINERALS) tablet, Take 1 tablet by mouth daily. , Disp: , Rfl:    ondansetron (ZOFRAN ODT) 4 MG disintegrating tablet, Take 1 tablet (4 mg total) by mouth every 6 (six) hours as needed for nausea., Disp: 60 tablet, Rfl: 1   promethazine (PHENERGAN) 25 MG tablet, Take 1 tablet (25 mg total) by mouth every 8 (eight) hours as needed for nausea or vomiting., Disp: 60 tablet, Rfl: 1   rivaroxaban (XARELTO) 20 MG TABS tablet, Take 1 tablet (20 mg total) by mouth daily with supper., Disp: 30 tablet, Rfl: 6   Spacer/Aero-Holding Chambers (AEROCHAMBER PLUS) inhaler, Use as instructed, Disp: 1 each, Rfl: 2   vitamin B-12 (CYANOCOBALAMIN) 100 MCG tablet, Take 100 mcg by mouth daily., Disp: , Rfl:  vitamin C (ASCORBIC ACID) 500 MG tablet, Take 500 mg by mouth daily., Disp: , Rfl:   Allergies  Allergen Reactions   Covid-19 (Mrna) Vaccine Itching and Other (See Comments)    Lip swelling and tingling, throat itching, itching and redness on face, scalp and chest.       ROS  Constitutional: Negative for fever or weight change.  Respiratory: Negative for cough and shortness of breath.   Cardiovascular: Negative for chest pain or palpitations.  Gastrointestinal: Negative for abdominal pain, no bowel changes.  Musculoskeletal: Negative for gait problem or joint swelling.  Skin: Negative for rash.  Neurological: Negative for dizziness or headache.  No other specific complaints in a complete review of systems (except as listed in HPI above).   Objective  Vitals:   04/13/22 1143  BP: 126/82  Pulse: 90  Resp: 18  Temp: 98.2 F (36.8 C)  TempSrc: Oral  SpO2: 97%  Weight: (!) 328 lb 14.4 oz (149.2 kg)  Height: 5' 8"  (1.727 m)    Body mass index is 50.01 kg/m.  Physical Exam  Constitutional: Patient appears well-developed and well-nourished. No distress.  HENT: Head: Normocephalic and atraumatic. Ears: B TMs ok, no erythema or effusion; Nose: Nose normal. Mouth/Throat: Oropharynx is clear and moist. No oropharyngeal exudate.  Eyes: Conjunctivae and EOM are normal. Pupils are equal, round, and reactive to light. No scleral icterus.  Neck: Normal range of motion. Neck supple. No JVD present. No thyromegaly present.  Cardiovascular: Normal rate, regular rhythm and normal heart sounds.  No murmur heard. No BLE edema. Pulmonary/Chest: Effort normal and breath sounds normal. No respiratory distress. Abdominal: Soft. Bowel sounds are normal, no distension. There is no tenderness. no masses Musculoskeletal: Normal range of motion, no joint effusions. No gross deformities Neurological: he is alert and oriented to person, place, and time. No cranial nerve deficit. Coordination, balance, strength, speech and gait are normal.  Skin: Skin is warm and dry. No rash noted. No erythema.  Psychiatric: Patient has a normal mood and affect. behavior is normal. Judgment and thought content normal.   No results found for this or any previous visit (from the past  2160 hour(s)).    Fall Risk:    04/13/2022   11:48 AM 09/23/2021    8:14 AM 09/10/2021    8:23 AM 05/27/2021    9:28 AM 01/28/2021    9:21 AM  Fall Risk   Falls in the past year? 0 0 0 0 0  Number falls in past yr: 0 0 0 0 0  Injury with Fall? 0 0 0 0 0  Risk for fall due to :  Orthopedic patient No Fall Risks    Follow up Falls evaluation completed Falls evaluation completed Falls prevention discussed       Functional Status Survey: Is the patient deaf or have difficulty hearing?: No Does the patient have difficulty seeing, even when wearing glasses/contacts?: No Does the patient have difficulty concentrating, remembering, or making decisions?: No Does the patient have difficulty walking or climbing stairs?: No Does the patient have difficulty dressing or bathing?: No Does the patient have difficulty doing errands alone such as visiting a doctor's office or shopping?: No   Assessment & Plan  1. Annual physical exam  - Lipid panel - Hepatitis C antibody - MM 3D SCREEN BREAST BILATERAL; Future - Hemoglobin A1c - COMPLETE METABOLIC PANEL WITH GFR  2. Screening for diabetes mellitus  - Hemoglobin A1c - COMPLETE METABOLIC PANEL WITH GFR  3. Screening for cholesterol level  - Lipid panel  4. Encounter for screening mammogram for malignant neoplasm of breast  - MM 3D SCREEN BREAST BILATERAL; Future  5. Morbid obesity ([place])  - Lipid panel - Hemoglobin A1c - COMPLETE METABOLIC PANEL WITH GFR  6. Encounter for hepatitis C screening test for low risk patient  - Hepatitis C antibody  7. Screening for colon cancer  - Ambulatory referral to Gastroenterology  8. Multiple atypical skin moles  - Ambulatory referral to Dermatology  9. Mild intermittent asthma without complication  - albuterol (VENTOLIN HFA) 108 (90 Base) MCG/ACT inhaler; Inhale 2 puffs into the lungs every 4 (four) hours as needed for wheezing or shortness of breath.  Dispense: 8 g; Refill: 0  10.  history of Abnormal TSH  - TSH   -USPSTF grade A and B recommendations reviewed with patient; age-appropriate recommendations, preventive care, screening tests, etc discussed and encouraged; healthy living encouraged; see AVS for patient education given to patient -Discussed importance of 150 minutes of physical activity weekly, eat two servings of fish weekly, eat one serving of tree nuts ( cashews, pistachios, pecans, almonds.Marland Kitchen) every other day, eat 6 servings of fruit/vegetables daily and drink plenty of water and avoid sweet beverages.

## 2022-04-14 LAB — HEMOGLOBIN A1C
Hgb A1c MFr Bld: 5.2 % of total Hgb (ref <5.7)
Mean Plasma Glucose: 103 mg/dL
eAG (mmol/L): 5.7 mmol/L

## 2022-04-14 LAB — COMPLETE METABOLIC PANEL WITH GFR
AG Ratio: 1.4 (calc) (ref 1.0–2.5)
ALT: 15 U/L (ref 6–29)
AST: 15 U/L (ref 10–30)
Albumin: 4.1 g/dL (ref 3.6–5.1)
Alkaline phosphatase (APISO): 55 U/L (ref 31–125)
BUN: 14 mg/dL (ref 7–25)
CO2: 28 mmol/L (ref 20–32)
Calcium: 9.2 mg/dL (ref 8.6–10.2)
Chloride: 106 mmol/L (ref 98–110)
Creat: 0.73 mg/dL (ref 0.50–0.99)
Globulin: 2.9 g/dL (calc) (ref 1.9–3.7)
Glucose, Bld: 99 mg/dL (ref 65–99)
Potassium: 4.8 mmol/L (ref 3.5–5.3)
Sodium: 141 mmol/L (ref 135–146)
Total Bilirubin: 0.4 mg/dL (ref 0.2–1.2)
Total Protein: 7 g/dL (ref 6.1–8.1)
eGFR: 106 mL/min/{1.73_m2} (ref >=60)

## 2022-04-14 LAB — LIPID PANEL
Cholesterol: 194 mg/dL (ref <200)
HDL: 71 mg/dL (ref >=50)
LDL Cholesterol (Calc): 105 mg/dL (calc) — ABNORMAL HIGH
Non-HDL Cholesterol (Calc): 123 mg/dL (calc) (ref <130)
Total CHOL/HDL Ratio: 2.7 (calc) (ref <5.0)
Triglycerides: 88 mg/dL (ref <150)

## 2022-04-14 LAB — HEPATITIS C ANTIBODY
Hepatitis C Ab: NONREACTIVE
SIGNAL TO CUT-OFF: 0.08 (ref <1.00)

## 2022-04-14 LAB — TSH: TSH: 1.72 mIU/L

## 2022-05-18 NOTE — Progress Notes (Deleted)
Name: Tara Shea   MRN: [id number]    DOB: 05-18-1981   Date:05/18/2022       Progress Note  Subjective  Chief Complaint  Sore Throat/ Headache  HPI  *** Patient Active Problem List   Diagnosis Date Noted   Arthralgia of knee, left 09/23/2021   Popliteal cyst, left 09/23/2021   Adverse reaction to COVID-19 vaccine 01/28/2021   Pulmonary embolism ([place]) 01/21/2021   Shortness of breath 01/20/2021   Acute pulmonary embolus ([place]) 01/20/2021   DVT (deep venous thrombosis) ([place]) 01/20/2021   Acute pulmonary embolism ([place]) 01/20/2021   Infertility, female 11/26/2020   PCOS (polycystic ovarian syndrome) 11/26/2020   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/25/2020   Intramural and submucous leiomyoma of uterus 08/31/2019   Menorrhagia with irregular cycle 08/31/2019   Dysmenorrhea 08/31/2019   B12 deficiency 08/09/2019   Iron deficiency anemia due to chronic blood loss 06/22/2019   Acute deep vein thrombosis (DVT) of calf muscle vein of right lower extremity ([place]) 04/18/2019   Factor V Leiden mutation ([place])    Allergic rhinitis 03/01/2019   Kidney stone 08/02/2018   Asthma, mild intermittent 08/02/2018   Migraine headache with aura 08/02/2018   Morbid obesity ([place]) 08/02/2018    Past Surgical History:  Procedure Laterality Date   CHOLECYSTECTOMY N/A 07/28/2019   Procedure: LAPAROSCOPIC CHOLECYSTECTOMY;  Surgeon: Jules Husbands, MD;  Location: [facility];  Service: General;  Laterality: N/A;   HYSTEROSCOPY WITH D & C N/A 08/31/2019   Procedure: DILATATION AND CURETTAGE /HYSTEROSCOPY/ SUBMUCOSAL MYOMECTOMY;  Surgeon: Will Bonnet, MD;  Location: [facility];  Service: Gynecology;  Laterality: N/A;   HYSTEROSCOPY WITH D & C N/A 10/03/2019   Procedure: DILATATION AND CURETTAGE /HYSTEROSCOPY/ SUBMUCOSAL MYOMECTOMY;  Surgeon: Will Bonnet, MD;  Location: [facility];  Service: Gynecology;  Laterality: N/A;   HYSTEROSCOPY WITH D & C N/A 10/26/2019   Procedure: DILATATION AND CURETTAGE  /HYSTEROSCOPY, SUBMUCOSAL MYOMECTOMY;  Surgeon: Will Bonnet, MD;  Location: [facility];  Service: Gynecology;  Laterality: N/A;   MYOMECTOMY     PULMONARY THROMBECTOMY N/A 01/21/2021   Procedure: PULMONARY THROMBECTOMY;  Surgeon: Katha Cabal, MD;  Location: [facility];  Service: Cardiovascular;  Laterality: N/A;   TONSILLECTOMY     WISDOM TOOTH EXTRACTION     WRIST SURGERY     age 88 - rebroke it b/c it grew back wrong    Family History  Problem Relation Age of Onset   Heart murmur Mother    Thyroid disease Mother    Migraines Mother    Asthma Mother    Diabetes Mother        TYPE 2   Bipolar disorder Mother    Breast cancer Maternal Aunt 70   Cancer Maternal Aunt 81       colon   Diabetes Maternal Aunt    Cancer Maternal Uncle        skin   Cancer Maternal Grandmother 80       colon   Hypertension Maternal Grandmother    Heart attack Maternal Grandmother     Social History   Tobacco Use   Smoking status: Never   Smokeless tobacco: Never  Substance Use Topics   Alcohol use: Yes     Current Outpatient Medications:    albuterol (PROVENTIL) (2.5 MG/3ML) 0.083% nebulizer solution, Take 3 mLs (2.5 mg total) by nebulization every 6 (six) hours as needed for wheezing or shortness of breath., Disp: 75 mL, Rfl: 6  albuterol (VENTOLIN HFA) 108 (90 Base) MCG/ACT inhaler, Inhale 2 puffs into the lungs every 4 (four) hours as needed for wheezing or shortness of breath., Disp: 8 g, Rfl: 0   apixaban (ELIQUIS) 5 MG TABS tablet, Take by mouth., Disp: , Rfl:    COVID-19 At Home Antigen Test (CARESTART COVID-19 HOME TEST) KIT, Use as directed, Disp: 2 kit, Rfl: 0   ferrous sulfate 325 (65 FE) MG tablet, Take 325 mg by mouth daily., Disp: , Rfl:    fluticasone (FLOVENT HFA) 110 MCG/ACT inhaler, Inhale 1 puff into the lungs 2 (two) times daily., Disp: 12 g, Rfl: 0   Multiple Vitamins-Minerals (MULTIVITAMIN WITH MINERALS) tablet, Take 1 tablet by mouth daily. , Disp: ,  Rfl:    ondansetron (ZOFRAN ODT) 4 MG disintegrating tablet, Take 1 tablet (4 mg total) by mouth every 6 (six) hours as needed for nausea., Disp: 60 tablet, Rfl: 1   promethazine (PHENERGAN) 25 MG tablet, Take 1 tablet (25 mg total) by mouth every 8 (eight) hours as needed for nausea or vomiting., Disp: 60 tablet, Rfl: 1   rivaroxaban (XARELTO) 20 MG TABS tablet, Take 1 tablet (20 mg total) by mouth daily with supper., Disp: 30 tablet, Rfl: 6   Spacer/Aero-Holding Chambers (AEROCHAMBER PLUS) inhaler, Use as instructed, Disp: 1 each, Rfl: 2   vitamin B-12 (CYANOCOBALAMIN) 100 MCG tablet, Take 100 mcg by mouth daily., Disp: , Rfl:    vitamin C (ASCORBIC ACID) 500 MG tablet, Take 500 mg by mouth daily., Disp: , Rfl:   Allergies  Allergen Reactions   Covid-19 (Mrna) Vaccine Itching and Other (See Comments)    Lip swelling and tingling, throat itching, itching and redness on face, scalp and chest.     I personally reviewed active problem list, medication list, allergies, family history, social history, health maintenance with the patient/caregiver today.   ROS  ***  Objective  There were no vitals filed for this visit.  There is no height or weight on file to calculate BMI.  Physical Exam ***  Recent Results (from the past 2160 hour(s))  Lipid panel     Status: Abnormal   Collection Time: 04/13/22 12:08 PM  Result Value Ref Range   Cholesterol 194 <200 mg/dL   HDL 71 > OR = 50 mg/dL   Triglycerides 88 <150 mg/dL   LDL Cholesterol (Calc) 105 (H) mg/dL (calc)    Comment: Reference range: <100 . Desirable range <100 mg/dL for primary prevention;   <70 mg/dL for patients with CHD or diabetic patients  with > or = 2 CHD risk factors. Marland Kitchen LDL-C is now calculated using the Martin-Hopkins  calculation, which is a validated novel method providing  better accuracy than the Friedewald equation in the  estimation of LDL-C.  Cresenciano Genre et al. Annamaria Helling. 2197;588(32): 2061-2068   (http://education.[facility].com/faq/FAQ164)    Total CHOL/HDL Ratio 2.7 <5.0 (calc)   Non-HDL Cholesterol (Calc) 123 <130 mg/dL (calc)    Comment: For patients with diabetes plus 1 major ASCVD risk  factor, treating to a non-HDL-C goal of <100 mg/dL  (LDL-C of <70 mg/dL) is considered a therapeutic  option.   Hepatitis C antibody     Status: None   Collection Time: 04/13/22 12:08 PM  Result Value Ref Range   Hepatitis C Ab NON-REACTIVE NON-REACTIVE   SIGNAL TO CUT-OFF 0.08 <1.00    Comment: . HCV antibody was non-reactive. There is no laboratory  evidence of HCV infection. . In most cases, no further action  is required. However, if recent HCV exposure is suspected, a test for HCV RNA (test code [phone]) is suggested. . For additional information please refer to http://education.[facility].com/faq/FAQ22v1 (This link is being provided for informational/ educational purposes only.) .   Hemoglobin A1c     Status: None   Collection Time: 04/13/22 12:08 PM  Result Value Ref Range   Hgb A1c MFr Bld 5.2 <5.7 % of total Hgb    Comment: For the purpose of screening for the presence of diabetes: . <5.7%       Consistent with the absence of diabetes 5.7-6.4%    Consistent with increased risk for diabetes             (prediabetes) > or =6.5%  Consistent with diabetes . This assay result is consistent with a decreased risk of diabetes. . Currently, no consensus exists regarding use of hemoglobin A1c for diagnosis of diabetes in children. . According to American Diabetes Association (ADA) guidelines, hemoglobin A1c <7.0% represents optimal control in non-pregnant diabetic patients. Different metrics may apply to specific patient populations.  Standards of Medical Care in Diabetes(ADA). .    Mean Plasma Glucose 103 mg/dL   eAG (mmol/L) 5.7 mmol/L  COMPLETE METABOLIC PANEL WITH GFR     Status: None   Collection Time: 04/13/22 12:08 PM  Result Value Ref Range    Glucose, Bld 99 65 - 99 mg/dL    Comment: .            Fasting reference interval .    BUN 14 7 - 25 mg/dL   Creat 0.73 0.50 - 0.99 mg/dL   eGFR 106 > OR = 60 mL/min/1.74m    Comment: The eGFR is based on the CKD-EPI 2021 equation. To calculate  the new eGFR from a previous Creatinine or Cystatin C result, go to https://www.kidney.org/professionals/ kdoqi/gfr%5Fcalculator    BUN/Creatinine Ratio NOT APPLICABLE 6 - 22 (calc)   Sodium 141 135 - 146 mmol/L   Potassium 4.8 3.5 - 5.3 mmol/L   Chloride 106 98 - 110 mmol/L   CO2 28 20 - 32 mmol/L   Calcium 9.2 8.6 - 10.2 mg/dL   Total Protein 7.0 6.1 - 8.1 g/dL   Albumin 4.1 3.6 - 5.1 g/dL   Globulin 2.9 1.9 - 3.7 g/dL (calc)   AG Ratio 1.4 1.0 - 2.5 (calc)   Total Bilirubin 0.4 0.2 - 1.2 mg/dL   Alkaline phosphatase (APISO) 55 31 - 125 U/L   AST 15 10 - 30 U/L   ALT 15 6 - 29 U/L  TSH     Status: None   Collection Time: 04/13/22 12:08 PM  Result Value Ref Range   TSH 1.72 mIU/L    Comment:           Reference Range .           > or = 20 Years  0.40-4.50 .                Pregnancy Ranges           First trimester    0.26-2.66           Second trimester   0.55-2.73           Third trimester    0.43-2.91     PHQ2/9:    04/13/2022   11:49 AM 09/23/2021    8:14 AM 09/10/2021    8:23 AM 05/27/2021    9:28 AM 01/28/2021    9:22 AM  Depression screen PHQ 2/9  Decreased Interest 0 0 0 0 0  Down, Depressed, Hopeless 0 0 0 0 0  PHQ - 2 Score 0 0 0 0 0  Altered sleeping  0 0 0   Tired, decreased energy  0 0 0   Change in appetite  0 0 0   Feeling bad or failure about yourself   0 0 0   Trouble concentrating  0 0 0   Moving slowly or fidgety/restless  0 0 0   Suicidal thoughts  0 0 0   PHQ-9 Score  0 0 0   Difficult doing work/chores  Not difficult at all Not difficult at all Not difficult at all     phq 9 is {gen pos [id number]}   Fall Risk:    04/13/2022   11:48 AM 09/23/2021    8:14 AM 09/10/2021    8:23 AM 05/27/2021     9:28 AM 01/28/2021    9:21 AM  Fall Risk   Falls in the past year? 0 0 0 0 0  Number falls in past yr: 0 0 0 0 0  Injury with Fall? 0 0 0 0 0  Risk for fall due to :  Orthopedic patient No Fall Risks    Follow up Falls evaluation completed Falls evaluation completed Falls prevention discussed        Functional Status Survey:      Assessment & Plan   1. Sore throat ***

## 2022-05-19 ENCOUNTER — Ambulatory Visit: Payer: 59 | Admitting: Family Medicine

## 2022-07-22 ENCOUNTER — Other Ambulatory Visit: Payer: Self-pay | Admitting: Nurse Practitioner

## 2022-07-22 DIAGNOSIS — J4520 Mild intermittent asthma, uncomplicated: Secondary | ICD-10-CM

## 2022-07-23 NOTE — Telephone Encounter (Signed)
Requested Prescriptions  Pending Prescriptions Disp Refills  . albuterol (VENTOLIN HFA) 108 (90 Base) MCG/ACT inhaler [Pharmacy Med Name: ALBUTEROL HFA (PROAIR) INHALER] 8.5 each 2    Sig: INHALE 2 PUFFS INTO THE LUNGS EVERY 4 HOURS AS NEEDED FOR WHEEZING OR SHORTNESS OF BREATH.     Pulmonology:  Beta Agonists 2 Passed - 07/22/2022  3:44 PM      Passed - Last BP in normal range    BP Readings from Last 1 Encounters:  04/13/22 126/82         Passed - Last Heart Rate in normal range    Pulse Readings from Last 1 Encounters:  04/13/22 90         Passed - Valid encounter within last 12 months    Recent Outpatient Visits          3 months ago Annual physical exam   [facility] Bo Merino, FNP   10 months ago Arthralgia of knee, left   [facility], Earley Abide, MD   10 months ago Menorrhagia with irregular cycle   [place], DO   1 year ago Viral URI   [facility] Myles Gip, DO   1 year ago Acute deep vein thrombosis (DVT) of calf muscle vein of right lower extremity [facility])   [facility] Myles Gip, DO      Future Appointments            In 3 months Ralene Bathe, MD [place]   In 8 months Reece Packer, Myna Hidalgo, [facility], [facility]

## 2022-08-04 ENCOUNTER — Encounter: Payer: Self-pay | Admitting: Nurse Practitioner

## 2022-08-04 ENCOUNTER — Other Ambulatory Visit: Payer: Self-pay | Admitting: Surgery

## 2022-08-04 ENCOUNTER — Other Ambulatory Visit: Payer: Self-pay | Admitting: Hematology & Oncology

## 2022-08-04 ENCOUNTER — Ambulatory Visit
Admission: RE | Admit: 2022-08-04 | Discharge: 2022-08-04 | Disposition: A | Discharge status: Home / Self Care | Payer: 59 | Source: Ambulatory Visit | Attending: Hematology & Oncology | Admitting: Hematology & Oncology

## 2022-08-04 DIAGNOSIS — I83811 Varicose veins of right lower extremities with pain: Secondary | ICD-10-CM | POA: Insufficient documentation

## 2022-08-17 ENCOUNTER — Telehealth: Payer: 59 | Admitting: Physician Assistant

## 2022-08-17 DIAGNOSIS — T3695XA Adverse effect of unspecified systemic antibiotic, initial encounter: Secondary | ICD-10-CM

## 2022-08-17 DIAGNOSIS — R3989 Other symptoms and signs involving the genitourinary system: Secondary | ICD-10-CM | POA: Diagnosis not present

## 2022-08-17 DIAGNOSIS — B379 Candidiasis, unspecified: Secondary | ICD-10-CM | POA: Diagnosis not present

## 2022-08-17 MED ORDER — FLUCONAZOLE 150 MG PO TABS: 150.0000 mg | ORAL_TABLET | ORAL | 0 refills | Status: DC | PRN

## 2022-08-17 MED ORDER — CEPHALEXIN 500 MG PO CAPS: 500.0000 mg | ORAL_CAPSULE | Freq: Two times a day (BID) | ORAL | 0 refills | Status: DC

## 2022-08-17 NOTE — Progress Notes (Signed)
E-Visit for Urinary Problems  We are sorry that you are not feeling well.  Here is how we plan to help!  Based on what you shared with me it looks like you most likely have a simple urinary tract infection.  A UTI (Urinary Tract Infection) is a bacterial infection of the bladder.  Most cases of urinary tract infections are simple to treat but a key part of your care is to encourage you to drink plenty of fluids and watch your symptoms carefully.  I have prescribed Keflex 500 mg twice a day for 7 days.  Your symptoms should gradually improve. Call us if the burning in your urine worsens, you develop worsening fever, back pain or pelvic pain or if your symptoms do not resolve after completing the antibiotic.  I also have prescribed Fluconazole (Diflucan) for antibiotic induced yeast infections.   Urinary tract infections can be prevented by drinking plenty of water to keep your body hydrated.  Also be sure when you wipe, wipe from front to back and don't hold it in!  If possible, empty your bladder every 4 hours.  HOME CARE Drink plenty of fluids Compete the full course of the antibiotics even if the symptoms resolve Remember, when you need to go.go. Holding in your urine can increase the likelihood of getting a UTI! GET HELP RIGHT AWAY IF: You cannot urinate You get a high fever Worsening back pain occurs You see blood in your urine You feel sick to your stomach or throw up You feel like you are going to pass out  MAKE SURE YOU  Understand these instructions. Will watch your condition. Will get help right away if you are not doing well or get worse.   Thank you for choosing an e-visit.  Your e-visit answers were reviewed by a board certified advanced clinical practitioner to complete your personal care plan. Depending upon the condition, your plan could have included both over the counter or prescription medications.  Please review your pharmacy choice. Make sure the pharmacy is  open so you can pick up prescription now. If there is a problem, you may contact your provider through [employer] and have the prescription routed to another pharmacy.  Your safety is important to [place]. If you have drug allergies check your prescription carefully.   For the next 24 hours you can use MyChart to ask questions about today's visit, request a non-urgent call back, or ask for a work or school excuse. You will get an email in the next two days asking about your experience. I hope that your e-visit has been valuable and will speed your recovery.  I provided 5 minutes of non face-to-face time during this encounter for chart review and documentation.

## 2022-08-24 ENCOUNTER — Telehealth: Payer: 59 | Admitting: Physician Assistant

## 2022-08-24 DIAGNOSIS — J020 Streptococcal pharyngitis: Secondary | ICD-10-CM | POA: Diagnosis not present

## 2022-08-24 MED ORDER — AMOXICILLIN-POT CLAVULANATE 875-125 MG PO TABS: 1.0000 | ORAL_TABLET | Freq: Two times a day (BID) | ORAL | 0 refills | Status: DC

## 2022-08-24 NOTE — Progress Notes (Signed)
E-Visit for Sore Throat - Strep Symptoms  We are sorry that you are not feeling well.  Here is how we plan to help!  Based on what you have shared with me it is likely that you have strep pharyngitis.  Strep pharyngitis is inflammation and infection in the back of the throat.  This is an infection cause by bacteria and is treated with antibiotics.  I have prescribed Augmentin 875-'125mg'$  Take 1 tablet twice daily for 10 days.. For throat pain, we recommend over the counter oral pain relief medications such as acetaminophen or aspirin, or anti-inflammatory medications such as ibuprofen or naproxen sodium. Topical treatments such as oral throat lozenges or sprays may be used as needed. Strep infections are not as easily transmitted as other respiratory infections, however we still recommend that you avoid close contact with loved ones, especially the very young and elderly.  Remember to wash your hands thoroughly throughout the day as this is the number one way to prevent the spread of infection and wipe down door knobs and counters with disinfectant.   Home Care: Only take medications as instructed by your medical team. Complete the entire course of an antibiotic. Do not take these medications with alcohol. A steam or ultrasonic humidifier can help congestion.  You can place a towel over your head and breathe in the steam from hot water coming from a faucet. Avoid close contacts especially the very young and the elderly. Cover your mouth when you cough or sneeze. Always remember to wash your hands.  Get Help Right Away If: You develop worsening fever or sinus pain. You develop a severe head ache or visual changes. Your symptoms persist after you have completed your treatment plan.  Make sure you Understand these instructions. Will watch your condition. Will get help right away if you are not doing well or get worse.   Thank you for choosing an e-visit.  Your e-visit answers were reviewed by  a board certified advanced clinical practitioner to complete your personal care plan. Depending upon the condition, your plan could have included both over the counter or prescription medications.  Please review your pharmacy choice. Make sure the pharmacy is open so you can pick up prescription now. If there is a problem, you may contact your provider through [employer] and have the prescription routed to another pharmacy.  Your safety is important to [place]. If you have drug allergies check your prescription carefully.   For the next 24 hours you can use MyChart to ask questions about today's visit, request a non-urgent call back, or ask for a work or school excuse. You will get an email in the next two days asking about your experience. I hope that your e-visit has been valuable and will speed your recovery.  I provided 5 minutes of non face-to-face time during this encounter for chart review and documentation.

## 2022-09-21 ENCOUNTER — Encounter (INDEPENDENT_AMBULATORY_CARE_PROVIDER_SITE_OTHER): Payer: Self-pay

## 2022-10-22 ENCOUNTER — Ambulatory Visit: Payer: 59 | Admitting: Dermatology

## 2022-10-27 ENCOUNTER — Other Ambulatory Visit: Payer: Self-pay | Admitting: Nurse Practitioner

## 2022-10-27 DIAGNOSIS — J4520 Mild intermittent asthma, uncomplicated: Secondary | ICD-10-CM

## 2022-10-27 NOTE — Telephone Encounter (Signed)
Requested Prescriptions  Pending Prescriptions Disp Refills   albuterol (VENTOLIN HFA) 108 (90 Base) MCG/ACT inhaler [Pharmacy Med Name: ALBUTEROL HFA (PROAIR) INHALER] 8.5 each 2    Sig: INHALE 2 PUFFS BY MOUTH EVERY 4 HOURS AS NEEDED FOR WHEEZE OR FOR SHORTNESS OF BREATH     Pulmonology:  Beta Agonists 2 Passed - 10/27/2022  1:05 AM      Passed - Last BP in normal range    BP Readings from Last 1 Encounters:  04/13/22 126/82         Passed - Last Heart Rate in normal range    Pulse Readings from Last 1 Encounters:  04/13/22 90         Passed - Valid encounter within last 12 months    Recent Outpatient Visits           6 months ago Annual physical exam   [facility] Bo Merino, FNP   1 year ago Arthralgia of knee, left   [facility] [place], Earley Abide, MD   1 year ago Menorrhagia with irregular cycle   [place], DO   1 year ago Viral URI   [place], DO   1 year ago Acute deep vein thrombosis (DVT) of calf muscle vein of right lower extremity [facility])   [facility] Myles Gip, DO       Future Appointments             In 5 months Reece Packer, Myna Hidalgo, [facility], [facility]

## 2022-11-12 ENCOUNTER — Encounter: Payer: Self-pay | Admitting: Gastroenterology

## 2022-11-12 ENCOUNTER — Encounter: Payer: Self-pay | Admitting: Nurse Practitioner

## 2022-11-12 NOTE — H&P (Signed)
Pre-Procedure H&P   Patient ID: Tara Shea is a 41 y.o. female.  Gastroenterology Provider: Annamaria Helling, DO  Referring Provider: Dawson Bills, NP PCP: Bo Merino, FNP  Date: 11/13/2022  HPI Ms. Tara Shea is a 41 y.o. female who presents today for Colonoscopy for Screening colonoscopy-fhx of colon polyps .  Patient last underwent colonoscopy in May 2014 with biopsies negative for microscopic colitis and normal terminal ileum.  Currently she has 2-3 bowel movements per day without melena or hematochezia.  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  She is status postcholecystectomy  Most recent lab work hemoglobin 9.1 MCV 84 platelets 260,000 iron saturation 11% TIBC 398 ferritin 15 creatinine 0.7.   She has a family history of colon polyps- Tara Shea Has a strong family history of malignancy suspicious for Lynch syndrome which she is going to undergo testing for Tara Shea-cervical; Tara Shea breast cancer; Tara Shea colorectal cancer and endometrial cancer; Tara Shea skin cancer; Tara Shea colorectal cancer; Tara Shea lung cancer   Past Medical History:  Diagnosis Date   Asthma    Bone spur    Cholelithiasis 03/01/1855   Complication of anesthesia    pt had hypotensive episode and has had n & v   DVT (deep venous thrombosis) ([place]) 03/2019   x 1 and DEC 2011   Factor 5 Leiden mutation, heterozygous ([place])    stopped Xarelto 08/23/19   Fibroid    History of kidney stones    IDA (iron deficiency anemia)    vitamin b12 and iron deficiency   Migraines    PCOS (polycystic ovarian syndrome)    PCOS (polycystic ovarian syndrome)     Past Surgical History:  Procedure Laterality Date   CHOLECYSTECTOMY N/A 07/28/2019   Procedure: LAPAROSCOPIC CHOLECYSTECTOMY;  Surgeon: Jules Husbands, MD;  Location: [facility];  Service: General;  Laterality: N/A;   DILATION AND CURETTAGE OF UTERUS      HYSTEROSCOPY WITH D & C N/A 08/31/2019   Procedure: DILATATION AND CURETTAGE /HYSTEROSCOPY/ SUBMUCOSAL MYOMECTOMY;  Surgeon: Will Bonnet, MD;  Location: [facility];  Service: Gynecology;  Laterality: N/A;   HYSTEROSCOPY WITH D & C N/A 10/03/2019   Procedure: DILATATION AND CURETTAGE /HYSTEROSCOPY/ SUBMUCOSAL MYOMECTOMY;  Surgeon: Will Bonnet, MD;  Location: [facility];  Service: Gynecology;  Laterality: N/A;   HYSTEROSCOPY WITH D & C N/A 10/26/2019   Procedure: DILATATION AND CURETTAGE /HYSTEROSCOPY, SUBMUCOSAL MYOMECTOMY;  Surgeon: Will Bonnet, MD;  Location: [facility];  Service: Gynecology;  Laterality: N/A;   MYOMECTOMY     PULMONARY THROMBECTOMY N/A 01/21/2021   Procedure: PULMONARY THROMBECTOMY;  Surgeon: Katha Cabal, MD;  Location: [facility];  Service: Cardiovascular;  Laterality: N/A;   TONSILLECTOMY     WISDOM TOOTH EXTRACTION     WRIST SURGERY     age 79 - rebroke it b/c it grew back wrong    Family History She has a family history of colon polyps- Tara Shea Has a strong family history of malignancy suspicious for Lynch syndrome which she is going to undergo testing for Tara Shea-cervical; Tara Shea breast cancer; Tara Shea colorectal cancer and endometrial cancer; Tara Shea skin cancer; Tara Shea colorectal cancer; Tara Shea lung cancer No other h/o GI disease or malignancy  Review of Systems  Constitutional:  Negative for activity change, appetite change, chills, diaphoresis, fatigue, fever and unexpected weight change.  HENT:  Negative for trouble swallowing and voice change.  Respiratory:  Negative for shortness of breath and wheezing.   Cardiovascular:  Negative for chest pain, palpitations and leg swelling.  Gastrointestinal:  Negative for abdominal distention, abdominal pain, anal bleeding, blood in stool, constipation, diarrhea, nausea, rectal pain and vomiting.  Musculoskeletal:  Negative for arthralgias and  myalgias.  Skin:  Negative for color change and pallor.  Neurological:  Negative for dizziness, syncope and weakness.  Psychiatric/Behavioral:  Negative for confusion.   All other systems reviewed and are negative.    Medications No current facility-administered medications on file prior to encounter.   Current Outpatient Medications on File Prior to Encounter  Medication Sig Dispense Refill   ferrous sulfate 325 (65 FE) MG tablet Take 325 mg by mouth daily.     Multiple Vitamins-Minerals (MULTIVITAMIN WITH MINERALS) tablet Take 1 tablet by mouth daily.      Spacer/Aero-Holding Chambers (AEROCHAMBER PLUS) inhaler Use as instructed 1 each 2   vitamin B-12 (CYANOCOBALAMIN) 100 MCG tablet Take 100 mcg by mouth daily.     vitamin C (ASCORBIC ACID) 500 MG tablet Take 500 mg by mouth daily.     albuterol (PROVENTIL) (2.5 MG/3ML) 0.083% nebulizer solution Take 3 mLs (2.5 mg total) by nebulization every 6 (six) hours as needed for wheezing or shortness of breath. 75 mL 6   amoxicillin-clavulanate (AUGMENTIN) 875-125 MG tablet Take 1 tablet by mouth 2 (two) times daily. 20 tablet 0   apixaban (ELIQUIS) 5 MG TABS tablet Take by mouth.     cephALEXin (KEFLEX) 500 MG capsule Take 1 capsule (500 mg total) by mouth 2 (two) times daily. (Patient not taking: Reported on 11/13/2022) 14 capsule 0   COVID-19 At Home Antigen Test (CARESTART COVID-19 HOME TEST) KIT Use as directed 2 kit 0   fluconazole (DIFLUCAN) 150 MG tablet Take 1 tablet (150 mg total) by mouth every 3 (three) days as needed. (Patient not taking: Reported on 11/13/2022) 2 tablet 0   fluticasone (FLOVENT HFA) 110 MCG/ACT inhaler Inhale 1 puff into the lungs 2 (two) times daily. (Patient not taking: Reported on 11/13/2022) 12 g 0   ondansetron (ZOFRAN ODT) 4 MG disintegrating tablet Take 1 tablet (4 mg total) by mouth every 6 (six) hours as needed for nausea. 60 tablet 1   promethazine (PHENERGAN) 25 MG tablet Take 1 tablet (25 mg total) by  mouth every 8 (eight) hours as needed for nausea or vomiting. 60 tablet 1      6    Pertinent medications related to GI and procedure were reviewed by me with the patient prior to the procedure   Current Facility-Administered Medications:    0.9 %  sodium chloride infusion, , Intravenous, Continuous, Annamaria Helling, DO, Last Rate: 20 mL/hr at 11/13/22 4008, 1,000 mL at 11/13/22 6761      Allergies  Allergen Reactions   Covid-19 (Mrna) Vaccine Itching and Other (See Comments)    Lip swelling and tingling, throat itching, itching and redness on face, scalp and chest.    Allergies were reviewed by me prior to the procedure  Objective   Body mass index is 51.19 kg/m. Vitals:   11/13/22 0702  BP: (!) 151/85  Pulse: 79  Resp: 18  Temp: (!) 96.8 F (36 C)  TempSrc: Temporal  SpO2: 99%  Weight: (!) 152.7 kg  Height: _0  (1.727 m)     Physical Exam Vitals and nursing note reviewed.  Constitutional:      General: She is not in acute distress.  Appearance: Normal appearance. She is obese. She is not ill-appearing, toxic-appearing or diaphoretic.  HENT:     Head: Normocephalic and atraumatic.     Nose: Nose normal.     Mouth/Throat:     Mouth: Mucous membranes are moist.     Pharynx: Oropharynx is clear.  Eyes:     General: No scleral icterus.    Extraocular Movements: Extraocular movements intact.  Cardiovascular:     Rate and Rhythm: Normal rate and regular rhythm.     Heart sounds: Normal heart sounds. No murmur heard.    No friction rub. No gallop.  Pulmonary:     Effort: Pulmonary effort is normal. No respiratory distress.     Breath sounds: Normal breath sounds. No wheezing, rhonchi or rales.  Abdominal:     General: Bowel sounds are normal. There is no distension.     Palpations: Abdomen is soft.     Tenderness: There is no abdominal tenderness. There is no guarding or rebound.  Musculoskeletal:     Cervical back: Neck supple.     Right lower leg:  No edema.     Left lower leg: No edema.  Skin:    General: Skin is warm and dry.     Coloration: Skin is not jaundiced or pale.  Neurological:     General: No focal deficit present.     Mental Status: She is alert and oriented to person, place, and time. Mental status is at baseline.  Psychiatric:        Mood and Affect: Mood normal.        Behavior: Behavior normal.        Thought Content: Thought content normal.        Judgment: Judgment normal.      Assessment:  Tara Shea is a 41 y.o. female  who presents today for Colonoscopy for screening colonoscopy-family history of colon polyps.  Plan:  Colonoscopy with possible intervention today  Colonoscopy with possible biopsy, control of bleeding, polypectomy, and interventions as necessary has been discussed with the patient/patient representative. Informed consent was obtained from the patient/patient representative after explaining the indication, nature, and risks of the procedure including but not limited to death, bleeding, perforation, missed neoplasm/lesions, cardiorespiratory compromise, and reaction to medications. Opportunity for questions was given and appropriate answers were provided. Patient/patient representative has verbalized understanding is amenable to undergoing the procedure.   Annamaria Helling, DO  [facility]  Portions of the record may have been created with voice recognition software. Occasional wrong-word or 'sound-a-like' substitutions may have occurred due to the inherent limitations of voice recognition software.  Read the chart carefully and recognize, using context, where substitutions may have occurred.

## 2022-11-13 ENCOUNTER — Ambulatory Visit
Admission: RE | Admit: 2022-11-13 | Discharge: 2022-11-13 | Disposition: A | Discharge status: Home / Self Care | Payer: 59 | Source: Ambulatory Visit | Attending: Gastroenterology | Admitting: Gastroenterology

## 2022-11-13 ENCOUNTER — Encounter
Admission: RE | Disposition: A | Discharge status: Home / Self Care | Payer: Self-pay | Source: Ambulatory Visit | Attending: Gastroenterology

## 2022-11-13 ENCOUNTER — Ambulatory Visit: Payer: 59 | Admitting: Anesthesiology

## 2022-11-13 ENCOUNTER — Encounter: Payer: Self-pay | Admitting: Gastroenterology

## 2022-11-13 DIAGNOSIS — Z800 Family history of malignant neoplasm of digestive organs: Secondary | ICD-10-CM | POA: Insufficient documentation

## 2022-11-13 DIAGNOSIS — K635 Polyp of colon: Secondary | ICD-10-CM | POA: Insufficient documentation

## 2022-11-13 DIAGNOSIS — Z86711 Personal history of pulmonary embolism: Secondary | ICD-10-CM | POA: Insufficient documentation

## 2022-11-13 DIAGNOSIS — Z86718 Personal history of other venous thrombosis and embolism: Secondary | ICD-10-CM | POA: Diagnosis not present

## 2022-11-13 DIAGNOSIS — Z1211 Encounter for screening for malignant neoplasm of colon: Secondary | ICD-10-CM | POA: Insufficient documentation

## 2022-11-13 DIAGNOSIS — Z6841 Body Mass Index (BMI) 40.0 and over, adult: Secondary | ICD-10-CM | POA: Diagnosis not present

## 2022-11-13 DIAGNOSIS — D6851 Activated protein C resistance: Secondary | ICD-10-CM | POA: Insufficient documentation

## 2022-11-13 DIAGNOSIS — J45909 Unspecified asthma, uncomplicated: Secondary | ICD-10-CM | POA: Diagnosis not present

## 2022-11-13 DIAGNOSIS — Z7951 Long term (current) use of inhaled steroids: Secondary | ICD-10-CM | POA: Insufficient documentation

## 2022-11-13 DIAGNOSIS — Z7901 Long term (current) use of anticoagulants: Secondary | ICD-10-CM | POA: Insufficient documentation

## 2022-11-13 HISTORY — PX: COLONOSCOPY WITH PROPOFOL: SHX5780

## 2022-11-13 SURGERY — COLONOSCOPY WITH PROPOFOL
Anesthesia: General

## 2022-11-13 MED ORDER — MIDAZOLAM HCL 2 MG/2ML IJ SOLN
INTRAMUSCULAR | Status: AC
  Filled 2022-11-13: qty 2

## 2022-11-13 MED ORDER — PROPOFOL 1000 MG/100ML IV EMUL
INTRAVENOUS | Status: AC
  Filled 2022-11-13: qty 400

## 2022-11-13 MED ORDER — LIDOCAINE HCL (CARDIAC) PF 100 MG/5ML IV SOSY
PREFILLED_SYRINGE | INTRAVENOUS | Status: DC | PRN
  Administered 2022-11-13: 100 mg via INTRAVENOUS

## 2022-11-13 MED ORDER — PROPOFOL 500 MG/50ML IV EMUL
INTRAVENOUS | Status: DC | PRN
  Administered 2022-11-13: 165 ug/kg/min via INTRAVENOUS

## 2022-11-13 MED ORDER — SODIUM CHLORIDE 0.9 % IV SOLN
INTRAVENOUS | Status: DC
  Administered 2022-11-13: 1000 mL via INTRAVENOUS

## 2022-11-13 MED ORDER — GLYCOPYRROLATE 0.2 MG/ML IJ SOLN
INTRAMUSCULAR | Status: DC | PRN
  Administered 2022-11-13: .2 mg via INTRAVENOUS

## 2022-11-13 MED ORDER — ONDANSETRON HCL 4 MG/2ML IJ SOLN
INTRAMUSCULAR | Status: DC | PRN
  Administered 2022-11-13: 4 mg via INTRAVENOUS

## 2022-11-13 MED ORDER — PROPOFOL 10 MG/ML IV BOLUS
INTRAVENOUS | Status: DC | PRN
  Administered 2022-11-13: 20 mg via INTRAVENOUS
  Administered 2022-11-13: 50 mg via INTRAVENOUS

## 2022-11-13 MED ORDER — MIDAZOLAM HCL 2 MG/2ML IJ SOLN
INTRAMUSCULAR | Status: DC | PRN
  Administered 2022-11-13: 2 mg via INTRAVENOUS

## 2022-11-13 MED ORDER — PROPOFOL 10 MG/ML IV BOLUS
INTRAVENOUS | Status: AC
  Filled 2022-11-13: qty 20

## 2022-11-13 MED ORDER — DEXMEDETOMIDINE HCL IN NACL 200 MCG/50ML IV SOLN
INTRAVENOUS | Status: DC | PRN
  Administered 2022-11-13: 12 ug via INTRAVENOUS

## 2022-11-13 NOTE — Interval H&P Note (Signed)
History and Physical Interval Note: Preprocedure H&P from 11/13/22  was reviewed and there was no interval change after seeing and examining the patient.  Written consent was obtained from the patient after discussion of risks, benefits, and alternatives. Patient has consented to proceed with Colonoscopy with possible intervention   11/13/2022 7:34 AM  [name]  has presented today for surgery, with the diagnosis of FH Colon Polyps.  The various methods of treatment have been discussed with the patient and family. After consideration of risks, benefits and other options for treatment, the patient has consented to  Procedure(s): COLONOSCOPY WITH PROPOFOL (N/A) as a surgical intervention.  The patient's history has been reviewed, patient examined, no change in status, stable for surgery.  I have reviewed the patient's chart and labs.  Questions were answered to the patient's satisfaction.     Tara Shea

## 2022-11-13 NOTE — Anesthesia Preprocedure Evaluation (Addendum)
Anesthesia Evaluation  Patient identified by MRN, date of birth, ID band Patient awake    Reviewed: Allergy & Precautions, NPO status , Patient's Chart, lab work & pertinent test results  History of Anesthesia Complications (+) history of anesthetic complications  Airway Mallampati: III       Dental no notable dental hx.    Pulmonary asthma , neg sleep apnea, neg COPD, Not current smoker   Pulmonary exam normal        Cardiovascular (-) hypertension(-) Past MI and (-) CHF Normal cardiovascular exam(-) dysrhythmias (-) Valvular Problems/Murmurs     Neuro/Psych neg Seizures    GI/Hepatic Neg liver ROS,neg GERD  ,,  Endo/Other  neg diabetes  Morbid obesity  Renal/GU Renal disease (stones)     Musculoskeletal   Abdominal  (+) + obese  Peds  Hematology  (+) Blood dyscrasia, anemia  Factor 5 Leiden mutation, heterozygous   Anesthesia Other Findings   Reproductive/Obstetrics                             Anesthesia Physical Anesthesia Plan  ASA: III  Anesthesia Plan: General   Post-op Pain Management:    Induction: Intravenous  PONV Risk Score and Plan: 3 and Propofol infusion, TIVA and Treatment may vary due to age or medical condition  Airway Management Planned: Natural Airway  Additional Equipment:   Intra-op Plan:   Post-operative Plan:   Informed Consent: I have reviewed the patients History and Physical, chart, labs and discussed the procedure including the risks, benefits and alternatives for the proposed anesthesia with the patient or authorized representative who has indicated his/her understanding and acceptance.     Dental advisory given  Plan Discussed with: Anesthesiologist and CRNA  Anesthesia Plan Comments:         Anesthesia Quick Evaluation

## 2022-11-13 NOTE — Anesthesia Procedure Notes (Signed)
Procedure Name: General with mask airway Date/Time: 11/13/2022 7:56 AM  Performed by: Kelton Pillar, CRNAPre-anesthesia Checklist: Patient identified, Emergency Drugs available, Suction available and Patient being monitored Patient Re-evaluated:Patient Re-evaluated prior to induction Oxygen Delivery Method: Simple face mask Induction Type: IV induction Placement Confirmation: positive ETCO2, CO2 detector and breath sounds checked- equal and bilateral Dental Injury: Teeth and Oropharynx as per pre-operative assessment

## 2022-11-13 NOTE — Op Note (Signed)
[facility] Gastroenterology Patient Name: Tara Shea Procedure Date: 11/13/2022 7:14 AM MRN: [id number] Account #: [id number] Date of Birth: [birth year] Admit Type: Outpatient Age: 41 Room: [facility] ENDO ROOM 3 Gender: Female Note Status: Finalized Instrument Name: Park Meo [id number] Procedure:             Colonoscopy Indications:           Screening for colorectal malignant neoplasm, Screening                         for colon cancer: Family history of colorectal cancer                         in multiple 2nd degree relatives Providers:             Rueben Bash, DO Referring MD:          Myna Hidalgo. Reece Packer (Referring MD) Medicines:             Monitored Anesthesia Care Complications:         No immediate complications. Estimated blood loss:                         Minimal. Procedure:             Pre-Anesthesia Assessment:                        - Prior to the procedure, a History and Physical was                         performed, and patient medications and allergies were                         reviewed. The patient is competent. The risks and                         benefits of the procedure and the sedation options and                         risks were discussed with the patient. All questions                         were answered and informed consent was obtained.                         Patient identification and proposed procedure were                         verified by the physician, the nurse, the anesthetist                         and the technician in the endoscopy suite. Mental                         Status Examination: alert and oriented. Airway                         Examination: normal oropharyngeal airway and neck  mobility. Respiratory Examination: clear to                         auscultation. CV Examination: RRR, no murmurs, no S3                         or S4. Prophylactic Antibiotics: The patient does  not                         require prophylactic antibiotics. Prior                         Anticoagulants: The patient has taken no anticoagulant                         or antiplatelet agents. ASA Grade Assessment: III - A                         patient with severe systemic disease. After reviewing                         the risks and benefits, the patient was deemed in                         satisfactory condition to undergo the procedure. The                         anesthesia plan was to use monitored anesthesia care                         (MAC). Immediately prior to administration of                         medications, the patient was re-assessed for adequacy                         to receive sedatives. The heart rate, respiratory                         rate, oxygen saturations, blood pressure, adequacy of                         pulmonary ventilation, and response to care were                         monitored throughout the procedure. The physical                         status of the patient was re-assessed after the                         procedure.                        After obtaining informed consent, the colonoscope was                         passed under direct vision. Throughout the procedure,  the patient's blood pressure, pulse, and oxygen                         saturations were monitored continuously. The                         Colonoscope was introduced through the anus and                         advanced to the the terminal ileum, with                         identification of the appendiceal orifice and IC                         valve. The colonoscopy was performed without                         difficulty. The patient tolerated the procedure well.                         The quality of the bowel preparation was evaluated                         using the BBPS [facility] Bowel Preparation Scale) with                         scores  of: Right Colon = 3, Transverse Colon = 3 and                         Left Colon = 3 (entire mucosa seen well with no                         residual staining, small fragments of stool or opaque                         liquid). The total BBPS score equals 9. The terminal                         ileum, ileocecal valve, appendiceal orifice, and                         rectum were photographed. Findings:      The perianal and digital rectal examinations were normal. Pertinent       negatives include normal sphincter tone.      The terminal ileum appeared normal. Estimated blood loss: none.      A 1 to 2 mm polyp was found in the ascending colon. The polyp was       sessile. The polyp was removed with a jumbo cold forceps. Resection and       retrieval were complete. Estimated blood loss was minimal.      The exam was otherwise without abnormality on direct and retroflexion       views. Impression:            - The examined portion of the ileum was normal.                        -  One 1 to 2 mm polyp in the ascending colon, removed                         with a jumbo cold forceps. Resected and retrieved.                        - The examination was otherwise normal on direct and                         retroflexion views. Recommendation:        - Patient has a contact number available for                         emergencies. The signs and symptoms of potential                         delayed complications were discussed with the patient.                         Return to normal activities tomorrow. Written                         discharge instructions were provided to the patient.                        - Discharge patient to home.                        - Resume previous diet.                        - Continue present medications.                        - Resume Eliquis (apixaban) at prior dose tomorrow.                         Refer to managing physician for further adjustment of                          therapy.                        - Await pathology results.                        - Repeat colonoscopy for surveillance based on                         pathology results.                        - Return to GI office as previously scheduled.                        - Genetics referall pending.                        - The findings and recommendations were discussed with  the patient. Procedure Code(s):     --- Professional ---                        [phone], Colonoscopy, flexible; with biopsy, single or                         multiple Diagnosis Code(s):     --- Professional ---                        Z12.11, Encounter for screening for malignant neoplasm                         of colon                        Z80.0, Family history of malignant neoplasm of                         digestive organs                        D12.2, Benign neoplasm of ascending colon CPT copyright 2022 American Medical Association. All rights reserved. The codes documented in this report are preliminary and upon coder review may  be revised to meet current compliance requirements. Attending Participation:      I personally performed the entire procedure. Volney American, DO Annamaria Helling DO, DO 11/13/2022 8:06:49 AM This report has been signed electronically. Number of Addenda: 0 Note Initiated On: 11/13/2022 7:14 AM Scope Withdrawal Time: 0 hours 15 minutes 55 seconds  Total Procedure Duration: 0 hours 18 minutes 56 seconds  Estimated Blood Loss:  Estimated blood loss was minimal.      [facility]

## 2022-11-13 NOTE — Anesthesia Postprocedure Evaluation (Signed)
Anesthesia Post Note  Patient: Tara Shea  Procedure(s) Performed: COLONOSCOPY WITH PROPOFOL  Patient location during evaluation: PACU Anesthesia Type: General Level of consciousness: awake and alert Pain management: pain level controlled Vital Signs Assessment: post-procedure vital signs reviewed and stable Respiratory status: spontaneous breathing, nonlabored ventilation and respiratory function stable Cardiovascular status: blood pressure returned to baseline and stable Postop Assessment: no apparent nausea or vomiting Anesthetic complications: no   No notable events documented.   Last Vitals:  Vitals:   11/13/22 0805 11/13/22 0815  BP: (!) 102/59 117/72  Pulse:    Resp:    Temp: (!) 35.8 C   SpO2:      Last Pain:  Vitals:   11/13/22 0825  TempSrc:   PainSc: 0-No pain                 [place] Ouch

## 2022-11-13 NOTE — Transfer of Care (Signed)
Immediate Anesthesia Transfer of Care Note  Patient: Tara Shea  Procedure(s) Performed: COLONOSCOPY WITH PROPOFOL  Patient Location: Endoscopy Unit  Anesthesia Type:General  Level of Consciousness: drowsy and patient cooperative  Airway & Oxygen Therapy: Patient Spontanous Breathing and Patient connected to face mask oxygen  Post-op Assessment: Report given to RN and Post -op Vital signs reviewed and stable  Post vital signs: Reviewed and stable  Last Vitals:  Vitals Value Taken Time  BP 102/59 11/13/22 0805  Temp    Pulse 73 11/13/22 0805  Resp 18 11/13/22 0806  SpO2 100 % 11/13/22 0805  Vitals shown include unvalidated device data.  Last Pain:  Vitals:   11/13/22 0702  TempSrc: Temporal  PainSc: 0-No pain         Complications: No notable events documented.

## 2022-11-17 LAB — SURGICAL PATHOLOGY

## 2022-12-04 ENCOUNTER — Encounter: Payer: Self-pay | Admitting: Nurse Practitioner

## 2022-12-29 ENCOUNTER — Encounter: Payer: Self-pay | Admitting: Internal Medicine

## 2023-01-21 ENCOUNTER — Ambulatory Visit: Payer: Federal, State, Local not specified - PPO | Admitting: Dermatology

## 2023-01-21 DIAGNOSIS — Z808 Family history of malignant neoplasm of other organs or systems: Secondary | ICD-10-CM

## 2023-01-21 DIAGNOSIS — D239 Other benign neoplasm of skin, unspecified: Secondary | ICD-10-CM

## 2023-01-21 DIAGNOSIS — D225 Melanocytic nevi of trunk: Secondary | ICD-10-CM | POA: Diagnosis not present

## 2023-01-21 DIAGNOSIS — D229 Melanocytic nevi, unspecified: Secondary | ICD-10-CM

## 2023-01-21 DIAGNOSIS — D492 Neoplasm of unspecified behavior of bone, soft tissue, and skin: Secondary | ICD-10-CM

## 2023-01-21 DIAGNOSIS — L821 Other seborrheic keratosis: Secondary | ICD-10-CM

## 2023-01-21 DIAGNOSIS — D1722 Benign lipomatous neoplasm of skin and subcutaneous tissue of left arm: Secondary | ICD-10-CM | POA: Diagnosis not present

## 2023-01-21 DIAGNOSIS — R238 Other skin changes: Secondary | ICD-10-CM

## 2023-01-21 DIAGNOSIS — Z1283 Encounter for screening for malignant neoplasm of skin: Secondary | ICD-10-CM

## 2023-01-21 DIAGNOSIS — L578 Other skin changes due to chronic exposure to nonionizing radiation: Secondary | ICD-10-CM

## 2023-01-21 DIAGNOSIS — L814 Other melanin hyperpigmentation: Secondary | ICD-10-CM

## 2023-01-21 DIAGNOSIS — D1801 Hemangioma of skin and subcutaneous tissue: Secondary | ICD-10-CM

## 2023-01-21 HISTORY — DX: Other benign neoplasm of skin, unspecified: D23.9

## 2023-01-21 MED ORDER — MUPIROCIN 2 % EX OINT: TOPICAL_OINTMENT | CUTANEOUS | 2 refills | Status: DC

## 2023-01-21 NOTE — Progress Notes (Signed)
New Patient Visit  Subjective  Tara Shea is a 42 y.o. female who presents for the following: Annual Exam. Patient report hx abnormal moles.  The patient presents for Total-Body Skin Exam (TBSE) for skin cancer screening and mole check.  The patient has spots, moles and lesions to be evaluated, some may be new or changing and the patient has concerns that these could be cancer.  Family hx of Melanoma-uncle   The following portions of the chart were reviewed this encounter and updated as appropriate:   Tobacco  Allergies  Meds  Problems  Med Hx  Surg Hx  Fam Hx     Review of Systems:  No other skin or systemic complaints except as noted in HPI or Assessment and Plan.  Objective  Well appearing patient in no apparent distress; mood and affect are within normal limits.  A full examination was performed including scalp, head, eyes, ears, nose, lips, neck, chest, axillae, abdomen, back, buttocks, bilateral upper extremities, bilateral lower extremities, hands, feet, fingers, toes, fingernails, and toenails. All findings within normal limits unless otherwise noted below.  right sup medial scapula 0.8 cm irregular brown macule        left upper back at base neck 0.6 cm irregular brown macule        left mid back Rubbery papule    Assessment & Plan  Family history of melanoma Uncle hx of Melanoma  Neoplasm of skin (2) right sup medial scapula Epidermal / dermal shaving  Lesion diameter (cm):  0.8 Informed consent: discussed and consent obtained   Patient was prepped and draped in usual sterile fashion: area prepped with alcohol. Anesthesia: the lesion was anesthetized in a standard fashion   Anesthetic:  1% lidocaine w/ epinephrine 1-100,000 buffered w/ 8.4% NaHCO3 Instrument used: flexible razor blade   Hemostasis achieved with: pressure, aluminum chloride and electrodesiccation   Outcome: patient tolerated procedure well   Post-procedure details: wound  care instructions given   Post-procedure details comment:  Ointment and small bandage applied  Specimen 1 - Surgical pathology Differential Diagnosis: R/O Dysplastic nevus  Check Margins: No  left upper back at base neck Epidermal / dermal shaving  Lesion diameter (cm):  0.6 Informed consent: discussed and consent obtained   Patient was prepped and draped in usual sterile fashion: area prepped with alcohol. Anesthesia: the lesion was anesthetized in a standard fashion   Anesthetic:  1% lidocaine w/ epinephrine 1-100,000 buffered w/ 8.4% NaHCO3 Instrument used: flexible razor blade   Hemostasis achieved with: pressure, aluminum chloride and electrodesiccation   Outcome: patient tolerated procedure well   Post-procedure details: wound care instructions given   Post-procedure details comment:  Ointment and small bandage applied  Specimen 2 - Surgical pathology Differential Diagnosis: R/o Check Margins: No  Lipoma of left upper extremity left mid back Benign-appearing.  Observation.  Call clinic for new or changing lesions.  Recommend daily use of broad spectrum spf 30+ sunscreen to sun-exposed areas.    Skin pimple left arm  Lentigines - Scattered tan macules - Due to sun exposure - Benign-appearing, observe - Recommend daily broad spectrum sunscreen SPF 30+ to sun-exposed areas, reapply every 2 hours as needed. - Call for any changes  Seborrheic Keratoses - Stuck-on, waxy, tan-brown papules and/or plaques  - Benign-appearing - Discussed benign etiology and prognosis. - Observe - Call for any changes  Melanocytic Nevi - Tan-brown and/or pink-flesh-colored symmetric macules and papules - Benign appearing on exam today - Observation - Call  clinic for new or changing moles - Recommend daily use of broad spectrum spf 30+ sunscreen to sun-exposed areas.   Hemangiomas - Red papules - Discussed benign nature - Observe - Call for any changes  Actinic Damage - Chronic  condition, secondary to cumulative UV/sun exposure - diffuse scaly erythematous macules with underlying dyspigmentation - Recommend daily broad spectrum sunscreen SPF 30+ to sun-exposed areas, reapply every 2 hours as needed.  - Staying in the shade or wearing long sleeves, sun glasses (UVA+UVB protection) and wide brim hats (4-inch brim around the entire circumference of the hat) are also recommended for sun protection.  - Call for new or changing lesions.  Skin cancer screening performed today.   Return in about 1 year (around 01/21/2024) for TBSE, fx hx of Melanoma.  IMarye Round, CMA, am acting as scribe for Sarina Ser, MD .  Documentation: I have reviewed the above documentation for accuracy and completeness, and I agree with the above.  Sarina Ser, MD

## 2023-01-21 NOTE — Patient Instructions (Addendum)
Wound Care Instructions  Cleanse wound gently with soap and water once a day then pat dry with clean gauze. Apply a thin coat of Petrolatum (petroleum jelly, "Vaseline") over the wound (unless you have an allergy to this). We recommend that you use a new, sterile tube of Vaseline. Do not pick or remove scabs. Do not remove the yellow or white "healing tissue" from the base of the wound.  Cover the wound with fresh, clean, nonstick gauze and secure with paper tape. You may use Band-Aids in place of gauze and tape if the wound is small enough, but would recommend trimming much of the tape off as there is often too much. Sometimes Band-Aids can irritate the skin.  You should call the office for your biopsy report after 1 week if you have not already been contacted.  If you experience any problems, such as abnormal amounts of bleeding, swelling, significant bruising, significant pain, or evidence of infection, please call the office immediately.  FOR ADULT SURGERY PATIENTS: If you need something for pain relief you may take 1 extra strength Tylenol (acetaminophen) AND 2 Ibuprofen (200mg each) together every 4 hours as needed for pain. (do not take these if you are allergic to them or if you have a reason you should not take them.) Typically, you may only need pain medication for 1 to 3 days.     Due to recent changes in healthcare laws, you may see results of your pathology and/or laboratory studies on MyChart before the doctors have had a chance to review them. We understand that in some cases there may be results that are confusing or concerning to you. Please understand that not all results are received at the same time and often the doctors may need to interpret multiple results in order to provide you with the best plan of care or course of treatment. Therefore, we ask that you please give us 2 business days to thoroughly review all your results before contacting the office for clarification. Should  we see a critical lab result, you will be contacted sooner.   If You Need Anything After Your Visit  If you have any questions or concerns for your doctor, please call our main line at [phone] and press option 4 to reach your doctor's medical assistant. If no one answers, please leave a voicemail as directed and we will return your call as soon as possible. Messages left after 4 pm will be answered the following business day.   You may also send us a message via MyChart. We typically respond to MyChart messages within 1-2 business days.  For prescription refills, please ask your pharmacy to contact our office. Our fax number is [phone].  If you have an urgent issue when the clinic is closed that cannot wait until the next business day, you can page your doctor at the number below.    Please note that while we do our best to be available for urgent issues outside of office hours, we are not available 24/7.   If you have an urgent issue and are unable to reach us, you may choose to seek medical care at your doctor's office, retail clinic, urgent care center, or emergency room.  If you have a medical emergency, please immediately call 911 or go to the emergency department.  Pager Numbers  - Dr. Kowalski: [phone]  - Dr. Moye: [phone]  - Dr. Stewart: [phone]  In the event of inclement weather, please call our main line at   [phone] for an update on the status of any delays or closures.  Dermatology Medication Tips: Please keep the boxes that topical medications come in in order to help keep track of the instructions about where and how to use these. Pharmacies typically print the medication instructions only on the boxes and not directly on the medication tubes.   If your medication is too expensive, please contact our office at [phone] option 4 or send us a message through MyChart.   We are unable to tell what your co-pay for medications will be in  advance as this is different depending on your insurance coverage. However, we may be able to find a substitute medication at lower cost or fill out paperwork to get insurance to cover a needed medication.   If a prior authorization is required to get your medication covered by your insurance company, please allow us 1-2 business days to complete this process.  Drug prices often vary depending on where the prescription is filled and some pharmacies may offer cheaper prices.  The website www.goodrx.com contains coupons for medications through different pharmacies. The prices here do not account for what the cost may be with help from insurance (it may be cheaper with your insurance), but the website can give you the price if you did not use any insurance.  - You can print the associated coupon and take it with your prescription to the pharmacy.  - You may also stop by our office during regular business hours and pick up a GoodRx coupon card.  - If you need your prescription sent electronically to a different pharmacy, notify our office through [facility] MyChart or by phone at [phone] option 4.     Si Usted Necesita Algo Despus de Su Visita  Tambin puede enviarnos un mensaje a travs de MyChart. Por lo general respondemos a los mensajes de MyChart en el transcurso de 1 a 2 das hbiles.  Para renovar recetas, por favor pida a su farmacia que se ponga en contacto con nuestra oficina. Nuestro nmero de fax es el [phone].  Si tiene un asunto urgente cuando la clnica est cerrada y que no puede esperar hasta el siguiente da hbil, puede llamar/localizar a su doctor(a) al nmero que aparece a continuacin.   Por favor, tenga en cuenta que aunque hacemos todo lo posible para estar disponibles para asuntos urgentes fuera del horario de oficina, no estamos disponibles las 24 horas del da, los 7 das de la semana.   Si tiene un problema urgente y no puede comunicarse con nosotros, puede  optar por buscar atencin mdica  en el consultorio de su doctor(a), en una clnica privada, en un centro de atencin urgente o en una sala de emergencias.  Si tiene una emergencia mdica, por favor llame inmediatamente al 911 o vaya a la sala de emergencias.  Nmeros de bper  - Dr. Kowalski: [phone]  - Dra. Moye: [phone]  - Dra. Stewart: [phone]  En caso de inclemencias del tiempo, por favor llame a nuestra lnea principal al [phone] para una actualizacin sobre el estado de cualquier retraso o cierre.  Consejos para la medicacin en dermatologa: Por favor, guarde las cajas en las que vienen los medicamentos de uso tpico para ayudarle a seguir las instrucciones sobre dnde y cmo usarlos. Las farmacias generalmente imprimen las instrucciones del medicamento slo en las cajas y no directamente en los tubos del medicamento.   Si su medicamento es muy caro, por favor, pngase en contacto con   nuestra oficina llamando al [phone] y presione la opcin 4 o envenos un mensaje a travs de MyChart.   No podemos decirle cul ser su copago por los medicamentos por adelantado ya que esto es diferente dependiendo de la cobertura de su seguro. Sin embargo, es posible que podamos encontrar un medicamento sustituto a menor costo o llenar un formulario para que el seguro cubra el medicamento que se considera necesario.   Si se requiere una autorizacin previa para que su compaa de seguros cubra su medicamento, por favor permtanos de 1 a 2 das hbiles para completar este proceso.  Los precios de los medicamentos varan con frecuencia dependiendo del lugar de dnde se surte la receta y alguna farmacias pueden ofrecer precios ms baratos.  El sitio web www.goodrx.com tiene cupones para medicamentos de diferentes farmacias. Los precios aqu no tienen en cuenta lo que podra costar con la ayuda del seguro (puede ser ms barato con su seguro), pero el sitio web puede darle el  precio si no utiliz ningn seguro.  - Puede imprimir el cupn correspondiente y llevarlo con su receta a la farmacia.  - Tambin puede pasar por nuestra oficina durante el horario de atencin regular y recoger una tarjeta de cupones de GoodRx.  - Si necesita que su receta se enve electrnicamente a una farmacia diferente, informe a nuestra oficina a travs de MyChart de [facility] o por telfono llamando al [phone] y presione la opcin 4.  

## 2023-01-25 ENCOUNTER — Telehealth: Payer: Self-pay

## 2023-01-25 NOTE — Telephone Encounter (Signed)
-----   Message from Ralene Bathe, MD sent at 01/25/2023  1:46 PM EST ----- Diagnosis 1. Skin , right sup medial scapula DYSPLASTIC COMPOUND NEVUS WITH MILD ATYPIA 2. Skin , left upper back at base neck MELANOCYTIC NEVUS, JUNCTIONAL LENTIGINOUS TYPE  1- mild dysplastic Recheck next visit 2- benign mole No further treatment needed

## 2023-01-25 NOTE — Telephone Encounter (Signed)
Advised pt of bx results/sh ?

## 2023-01-25 NOTE — Telephone Encounter (Addendum)
Tried calling patient regarding results. No answer. LMOM for patient to return call.     ----- Message from Ralene Bathe, MD sent at 01/25/2023  1:46 PM EST ----- Diagnosis 1. Skin , right sup medial scapula DYSPLASTIC COMPOUND NEVUS WITH MILD ATYPIA 2. Skin , left upper back at base neck MELANOCYTIC NEVUS, JUNCTIONAL LENTIGINOUS TYPE  1- mild dysplastic Recheck next visit 2- benign mole No further treatment needed

## 2023-01-29 ENCOUNTER — Encounter: Payer: Self-pay | Admitting: Dermatology

## 2023-02-04 ENCOUNTER — Other Ambulatory Visit: Payer: Self-pay | Admitting: *Deleted

## 2023-02-04 DIAGNOSIS — D500 Iron deficiency anemia secondary to blood loss (chronic): Secondary | ICD-10-CM

## 2023-02-05 ENCOUNTER — Encounter: Payer: Self-pay | Admitting: Licensed Clinical Social Worker

## 2023-02-05 ENCOUNTER — Inpatient Hospital Stay: Payer: Federal, State, Local not specified - PPO

## 2023-02-05 ENCOUNTER — Encounter: Payer: Self-pay | Admitting: Internal Medicine

## 2023-02-05 ENCOUNTER — Inpatient Hospital Stay
Payer: Federal, State, Local not specified - PPO | Attending: Internal Medicine | Admitting: Licensed Clinical Social Worker

## 2023-02-05 ENCOUNTER — Inpatient Hospital Stay (HOSPITAL_BASED_OUTPATIENT_CLINIC_OR_DEPARTMENT_OTHER): Payer: Federal, State, Local not specified - PPO | Admitting: Internal Medicine

## 2023-02-05 VITALS — BP 126/91 | HR 76 | Temp 96.9°F | Resp 18 | Wt 331.8 lb

## 2023-02-05 DIAGNOSIS — Z800 Family history of malignant neoplasm of digestive organs: Secondary | ICD-10-CM

## 2023-02-05 DIAGNOSIS — Z7901 Long term (current) use of anticoagulants: Secondary | ICD-10-CM | POA: Diagnosis not present

## 2023-02-05 DIAGNOSIS — Z808 Family history of malignant neoplasm of other organs or systems: Secondary | ICD-10-CM

## 2023-02-05 DIAGNOSIS — Z86711 Personal history of pulmonary embolism: Secondary | ICD-10-CM | POA: Insufficient documentation

## 2023-02-05 DIAGNOSIS — Z86718 Personal history of other venous thrombosis and embolism: Secondary | ICD-10-CM | POA: Insufficient documentation

## 2023-02-05 DIAGNOSIS — Z803 Family history of malignant neoplasm of breast: Secondary | ICD-10-CM

## 2023-02-05 DIAGNOSIS — I82461 Acute embolism and thrombosis of right calf muscular vein: Secondary | ICD-10-CM

## 2023-02-05 DIAGNOSIS — Z8049 Family history of malignant neoplasm of other genital organs: Secondary | ICD-10-CM

## 2023-02-05 DIAGNOSIS — D500 Iron deficiency anemia secondary to blood loss (chronic): Secondary | ICD-10-CM

## 2023-02-05 DIAGNOSIS — D6851 Activated protein C resistance: Secondary | ICD-10-CM | POA: Insufficient documentation

## 2023-02-05 LAB — BASIC METABOLIC PANEL - CANCER CENTER ONLY
Anion gap: 7 (ref 5–15)
BUN: 14 mg/dL (ref 6–20)
CO2: 23 mmol/L (ref 22–32)
Calcium: 8.9 mg/dL (ref 8.9–10.3)
Chloride: 105 mmol/L (ref 98–111)
Creatinine: 0.59 mg/dL (ref 0.44–1.00)
GFR, Estimated: >60 mL/min (ref >60)
Glucose, Bld: 124 mg/dL — ABNORMAL HIGH (ref 70–99)
Potassium: 3.7 mmol/L (ref 3.5–5.1)
Sodium: 135 mmol/L (ref 135–145)

## 2023-02-05 LAB — CBC WITH DIFFERENTIAL (CANCER CENTER ONLY)
Abs Immature Granulocytes: 0.02 10*3/uL (ref 0.00–0.07)
Basophils Absolute: 0 10*3/uL (ref 0.0–0.1)
Basophils Relative: 1 %
Eosinophils Absolute: 0.2 10*3/uL (ref 0.0–0.5)
Eosinophils Relative: 3 %
HCT: 38.4 % (ref 36.0–46.0)
Hemoglobin: 12.3 g/dL (ref 12.0–15.0)
Immature Granulocytes: 0 %
Lymphocytes Relative: 26 %
Lymphs Abs: 1.4 10*3/uL (ref 0.7–4.0)
MCH: 27.8 pg (ref 26.0–34.0)
MCHC: 32 g/dL (ref 30.0–36.0)
MCV: 86.9 fL (ref 80.0–100.0)
Monocytes Absolute: 0.3 10*3/uL (ref 0.1–1.0)
Monocytes Relative: 6 %
Neutro Abs: 3.5 10*3/uL (ref 1.7–7.7)
Neutrophils Relative %: 64 %
Platelet Count: 224 10*3/uL (ref 150–400)
RBC: 4.42 MIL/uL (ref 3.87–5.11)
RDW: 14.6 % (ref 11.5–15.5)
WBC Count: 5.4 10*3/uL (ref 4.0–10.5)
nRBC: 0 % (ref 0.0–0.2)

## 2023-02-05 MED ORDER — APIXABAN 5 MG PO TABS: 5.0000 mg | ORAL_TABLET | Freq: Two times a day (BID) | ORAL | 10 refills | Status: DC

## 2023-02-05 NOTE — Progress Notes (Signed)
[facility] NOTE  Patient Care Team: Bo Merino, FNP as PCP - General (Nurse Practitioner) Cammie Sickle, MD as Consulting Physician (Hematology)  CHIEF COMPLAINTS/PURPOSE OF CONSULTATION: DVT/PE  # provoked DVT in 2011 [lesser saphenous vein left lower extremity/below calf; BCP/long car ride x 6 months anticoagulation]; Apr 07, 2019-unprovoked right lower extremity DVT nonocclusive-Xarelto x42m; October 2020-factor V Leiden heterozygous; Xarelto 10 mg/day.  Mar 23, 2020-recommend Lovenox 120 mg twice daily [plans to get pregnant but ? compliance]; FEB 28th, 2022-bilateral PE status post thrombectomy [Dr.Schneir]; left lower extremity DVT-stop Lovenox; switch over to Xarelto; on Eliquis /per [facility]>   #Hypercoagulable state-2011?  Factor V Leiden heterozygosity [as per patient; [facility]]-no records  #June 2020-anemia likely iron deficiency/menstrual periods.  Status post myomectomy-improved.  Oncology History   No history exists.   HISTORY OF PRESENTING ILLNESS: Patient ambulating-independently. Alone.   Virgina Shea 42 y.o.  female history of recurrent DVT right lower extremity;PE/left lower extremity DVT factor V Leiden heterozygous on indefinite anticoagulation with is here for follow-up.    Last seen At the [facility] in 07/2021.  She has been seen at [facility] due to insurance coverage.  She was changed from Xarelto to Eliquis.   She is on a weight loss program.  She is to lose weight.  Review of Systems  Constitutional:  Negative for chills, diaphoresis, fever and weight loss.  HENT:  Negative for nosebleeds and sore throat.   Eyes:  Negative for double vision.  Respiratory:  Negative for cough, hemoptysis, sputum production, shortness of breath and wheezing.   Cardiovascular:  Negative for chest pain, palpitations, orthopnea and leg swelling.  Gastrointestinal:  Negative for abdominal pain, blood in stool, constipation,  diarrhea, heartburn, melena, nausea and vomiting.  Genitourinary:  Negative for dysuria, frequency and urgency.  Musculoskeletal:  Negative for back pain and joint pain.  Skin: Negative.  Negative for itching and rash.  Neurological:  Negative for dizziness, tingling, focal weakness, weakness and headaches.  Endo/Heme/Allergies:  Does not bruise/bleed easily.  Psychiatric/Behavioral:  Negative for depression. The patient is not nervous/anxious and does not have insomnia.      MEDICAL HISTORY:  Past Medical History:  Diagnosis Date   Asthma    Bone spur    Cholelithiasis 99991111   Complication of anesthesia    pt had hypotensive episode and has had n & v   DVT (deep venous thrombosis) ([place]) 03/2019   x 1 and DEC 2011   Dysplastic nevus 01/21/2023   right super medial scalpula  mild atypia   Factor 5 Leiden mutation, heterozygous ([place])    stopped Xarelto 08/23/19   Fibroid    History of kidney stones    IDA (iron deficiency anemia)    vitamin b12 and iron deficiency   Migraines    PCOS (polycystic ovarian syndrome)    PCOS (polycystic ovarian syndrome)     SURGICAL HISTORY: Past Surgical History:  Procedure Laterality Date   CHOLECYSTECTOMY N/A 07/28/2019   Procedure: LAPAROSCOPIC CHOLECYSTECTOMY;  Surgeon: Jules Husbands, MD;  Location: [facility] ORS;  Service: General;  Laterality: N/A;   COLONOSCOPY WITH PROPOFOL N/A 11/13/2022   Procedure: COLONOSCOPY WITH PROPOFOL;  Surgeon: Annamaria Helling, DO;  Location: [facility] ENDOSCOPY;  Service: Gastroenterology;  Laterality: N/A;   DILATION AND CURETTAGE OF UTERUS     HYSTEROSCOPY WITH D & C N/A 08/31/2019   Procedure: DILATATION AND CURETTAGE /HYSTEROSCOPY/ SUBMUCOSAL MYOMECTOMY;  Surgeon: Will Bonnet, MD;  Location: [facility] ORS;  Service: Gynecology;  Laterality: N/A;   HYSTEROSCOPY WITH D & C N/A 10/03/2019   Procedure: DILATATION AND CURETTAGE /HYSTEROSCOPY/ SUBMUCOSAL MYOMECTOMY;  Surgeon: Will Bonnet, MD;   Location: [facility] ORS;  Service: Gynecology;  Laterality: N/A;   HYSTEROSCOPY WITH D & C N/A 10/26/2019   Procedure: DILATATION AND CURETTAGE /HYSTEROSCOPY, SUBMUCOSAL MYOMECTOMY;  Surgeon: Will Bonnet, MD;  Location: [facility] ORS;  Service: Gynecology;  Laterality: N/A;   MYOMECTOMY     PULMONARY THROMBECTOMY N/A 01/21/2021   Procedure: PULMONARY THROMBECTOMY;  Surgeon: Katha Cabal, MD;  Location: [place] CV LAB;  Service: Cardiovascular;  Laterality: N/A;   TONSILLECTOMY     WISDOM TOOTH EXTRACTION     WRIST SURGERY     age 26 - rebroke it b/c it grew back wrong    SOCIAL HISTORY: Social History   Socioeconomic History   Marital status: Married    Spouse name: Robbyn Nylin   Number of children: 0   Years of education: 16   Highest education level: Bachelor's degree (e.g., BA, AB, BS)  Occupational History   Occupation: Programmer, multimedia: [place]    Comment: ED    (works at [employer])  Tobacco Use   Smoking status: Never   Smokeless tobacco: Never  Vaping Use   Vaping Use: Never used  Substance and Sexual Activity   Alcohol use: Yes   Drug use: Never   Sexual activity: Yes    Partners: Male  Other Topics Concern   Not on file  Social History Narrative    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  No children.        Social Determinants of Health   Financial Resource Strain: Low Risk  (04/13/2022)   Overall Financial Resource Strain (CARDIA)    Difficulty of Paying Living Expenses: Not hard at all  Food Insecurity: No Food Insecurity (04/13/2022)   Hunger Vital Sign    Worried About Running Out of Food in the Last Year: Never true    Ran Out of Food in the Last Year: Never true  Transportation Needs: No Transportation Needs (04/13/2022)   PRAPARE - Hydrologist (Medical): No    Lack of Transportation (Non-Medical): No  Physical Activity: Insufficiently Active (04/13/2022)   Exercise Vital Sign    Days of Exercise per  Week: 2 days    Minutes of Exercise per Session: 60 min  Stress: No Stress Concern Present (04/13/2022)   [place]    Feeling of Stress : Only a little  Social Connections: Moderately Integrated (04/13/2022)   Social Connection and Isolation Panel [NHANES]    Frequency of Communication with Friends and Family: More than three times a week    Frequency of Social Gatherings with Friends and Family: Once a week    Attends Religious Services: 1 to 4 times per year    Active Member of [employer] or Organizations: No    Attends Archivist Meetings: Never    Marital Status: Married  Human resources officer Violence: Not At Risk (04/13/2022)   Humiliation, Afraid, Rape, and Kick questionnaire    Fear of Current or Ex-Partner: No    Emotionally Abused: No    Physically Abused: No    Sexually Abused: No    FAMILY HISTORY: Family History  Problem Relation Age of Onset   Heart murmur Mother    Thyroid disease  Mother    Migraines Mother    Asthma Mother    Diabetes Mother        TYPE 2   Bipolar disorder Mother    Breast cancer Maternal Aunt 48   Cancer Maternal Aunt 72       colon   Diabetes Maternal Aunt    Cancer Maternal Uncle        skin   Cancer Maternal Grandmother 80       colon   Hypertension Maternal Grandmother    Heart attack Maternal Grandmother     ALLERGIES:  is allergic to covid-19 (mrna) vaccine.  MEDICATIONS:  Current Outpatient Medications  Medication Sig Dispense Refill   albuterol (PROVENTIL) (2.5 MG/3ML) 0.083% nebulizer solution Take 3 mLs (2.5 mg total) by nebulization every 6 (six) hours as needed for wheezing or shortness of breath. 75 mL 6   albuterol (VENTOLIN HFA) 108 (90 Base) MCG/ACT inhaler INHALE 2 PUFFS BY MOUTH EVERY 4 HOURS AS NEEDED FOR WHEEZE OR FOR SHORTNESS OF BREATH 8.5 each 2   ferrous sulfate 325 (65 FE) MG tablet Take 325 mg by mouth daily.     mupirocin ointment  (BACTROBAN) 2 % Apply to skin qd-bid 22 g 2   ondansetron (ZOFRAN ODT) 4 MG disintegrating tablet Take 1 tablet (4 mg total) by mouth every 6 (six) hours as needed for nausea. 60 tablet 1   promethazine (PHENERGAN) 25 MG tablet Take 1 tablet (25 mg total) by mouth every 8 (eight) hours as needed for nausea or vomiting. 60 tablet 1   Spacer/Aero-Holding Chambers (AEROCHAMBER PLUS) inhaler Use as instructed 1 each 2   vitamin B-12 (CYANOCOBALAMIN) 100 MCG tablet Take 100 mcg by mouth daily.     vitamin C (ASCORBIC ACID) 500 MG tablet Take 500 mg by mouth daily.     amoxicillin-clavulanate (AUGMENTIN) 875-125 MG tablet Take 1 tablet by mouth 2 (two) times daily. 20 tablet 0   apixaban (ELIQUIS) 5 MG TABS tablet Take 1 tablet (5 mg total) by mouth 2 (two) times daily. 60 tablet 10   cephALEXin (KEFLEX) 500 MG capsule Take 1 capsule (500 mg total) by mouth 2 (two) times daily. 14 capsule 0   COVID-19 At Home Antigen Test (CARESTART COVID-19 HOME TEST) KIT Use as directed 2 kit 0   fluconazole (DIFLUCAN) 150 MG tablet Take 1 tablet (150 mg total) by mouth every 3 (three) days as needed. (Patient not taking: Reported on 11/13/2022) 2 tablet 0   fluticasone (FLOVENT HFA) 110 MCG/ACT inhaler Inhale 1 puff into the lungs 2 (two) times daily. (Patient not taking: Reported on 11/13/2022) 12 g 0   Multiple Vitamins-Minerals (MULTIVITAMIN WITH MINERALS) tablet Take 1 tablet by mouth daily.  (Patient not taking: Reported on 02/05/2023)     No current facility-administered medications for this visit.      Marland Kitchen  PHYSICAL EXAMINATION:  Vitals:   02/05/23 1100  BP: (!) 126/91  Pulse: 76  Resp: 18  Temp: (!) 96.9 F (36.1 C)   Filed Weights   02/05/23 1100  Weight: (!) 331 lb 12.8 oz (150.5 kg)    Physical Exam Constitutional:      Comments: Obese.  HENT:     Head: Normocephalic and atraumatic.     Mouth/Throat:     Pharynx: No oropharyngeal exudate.  Eyes:     Pupils: Pupils are equal, round, and  reactive to light.  Cardiovascular:     Rate and Rhythm: Normal rate and regular rhythm.  Pulmonary:     Effort: Pulmonary effort is normal. No respiratory distress.     Breath sounds: Normal breath sounds. No wheezing.  Abdominal:     General: Bowel sounds are normal. There is no distension.     Palpations: Abdomen is soft. There is no mass.     Tenderness: There is no abdominal tenderness. There is no guarding or rebound.  Musculoskeletal:        General: No tenderness. Normal range of motion.     Cervical back: Normal range of motion and neck supple.  Skin:    General: Skin is warm.  Neurological:     Mental Status: She is alert and oriented to person, place, and time.  Psychiatric:        Mood and Affect: Affect normal.     LABORATORY DATA:  I have reviewed the data as listed Lab Results  Component Value Date   WBC 5.4 02/05/2023   HGB 12.3 02/05/2023   HCT 38.4 02/05/2023   MCV 86.9 02/05/2023   PLT 224 02/05/2023   Recent Labs    04/13/22 1208 02/05/23 1101  NA 141 135  K 4.8 3.7  CL 106 105  CO2 28 23  GLUCOSE 99 124*  BUN 14 14  CREATININE 0.73 0.59  CALCIUM 9.2 8.9  GFRNONAA  --  >60  PROT 7.0  --   AST 15  --   ALT 15  --   BILITOT 0.4  --     RADIOGRAPHIC STUDIES: I have personally reviewed the radiological images as listed and agreed with the findings in the report. No results found.   ASSESSMENT & PLAN:   Acute deep vein thrombosis (DVT) of calf muscle vein of right lower extremity # RECURRENT thromboembolic heterozygous factor V Leiden with recurrent left lower extremity DVT [unprovoked; May 2020]; February 2022/also bilateral PE right heart strain on imaging CT [while on Lovenox 1 mg/kg- ?  Compliance].   # Patient currently on Eliquis 5 mg twice daily [[facility]].  Tolerating well.   Reviewed.  Patient will need indefinite anticoagulation.  # Iron deficient anemia- s/p myomectomy-hemoglobin today is 12.5.  No concerns of iron  deficiency.  # Family Hx of colon cancer/breast/ednometrial cancer [[facility]]-  # Obesity: Patient is on calorie count/diet.  She is losing weight.  Congratulated patient on working on weight loss.  I also discussed importance of healthy weight/and weight loss.  Strongly recommend eating more green leafy vegetables and cutting down processed food/ carbohydrates.  Instead increasing whole grains / protein in the diet.  Multiple studies have shown that optimal weight would help improve cardiovascular risk; also shown to cut on the risk of malignancies-colon cancer, breast cancer ovarian/uterine cancer in women and also prostate cancer in men.   # Since patient is clinically stable I think is reasonable for the patient to follow-up with PCP/can follow-up with [place] as needed.  Patient will check with her PCP/appointment regarding refills in May 2024.  If not she will follow-up with [place].  # DISPOSITION:  #  follow up in 6 months- MD- labs-cbc/-bmp- Dr.B  Cc; Rumbal, [place]   All questions were answered. The patient knows to call the clinic with any problems, questions or concerns.     Cammie Sickle, MD 02/05/2023 12:16 PM

## 2023-02-05 NOTE — Assessment & Plan Note (Addendum)
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  Compliance].   # Patient currently on Eliquis 5 mg twice daily [[facility]].  Tolerating well.   Reviewed.  Patient will need indefinite anticoagulation.  # Iron deficient anemia- s/p myomectomy-hemoglobin today is 12.5.  No concerns of iron deficiency.  # Family Hx of colon cancer/breast/ednometrial cancer [[facility]]-  # Obesity: Patient is on calorie count/diet.  She is losing weight.  Congratulated patient on working on weight loss.  I also discussed importance of healthy weight/and weight loss.  Strongly recommend eating more green leafy vegetables and cutting down processed food/ carbohydrates.  Instead increasing whole grains / protein in the diet.  Multiple studies have shown that optimal weight would help improve cardiovascular risk; also shown to cut on the risk of malignancies-colon cancer, breast cancer ovarian/uterine cancer in women and also prostate cancer in men.   # Since patient is clinically stable I think is reasonable for the patient to follow-up with PCP/can follow-up with [place] as needed.  Patient will check with her PCP/appointment regarding refills in May 2024.  If not she will follow-up with [place].  # DISPOSITION:  #  follow up in 6 months- MD- labs-cbc/-bmp- Dr.B  Cc; Rumbal, Alisson

## 2023-02-05 NOTE — Progress Notes (Signed)
REFERRING PROVIDER: Marval Regal, NP [place],  [place] [id number]  PRIMARY PROVIDER:  Bo Merino, FNP  PRIMARY REASON FOR VISIT:  1. Family history of colon cancer   2. Family history of breast cancer   3. Family history of uterine cancer   4. Family history of melanoma    I connected with [name] on 02/05/2023 at 12:45 PM EDT by MyChart video conference and verified that I am speaking with the correct person using two identifiers.    Patient location: home Provider location: home  HISTORY OF PRESENT ILLNESS:   Ms. Tara Shea, a 42 y.o. female, was seen for a [facility] cancer genetics consultation at the request of Denice Paradise, NP due to a family history of cancer.  [name] presents to clinic today to discuss the possibility of a hereditary predisposition to cancer, genetic testing, and to further clarify her future cancer risks, as well as potential cancer risks for family members.   CANCER HISTORY:  [name] is a 42 y.o. female with no personal history of cancer.    RISK FACTORS:  Ovaries intact: yes.  Hysterectomy: no.  Menopausal status: premenopausal.  HRT use: 0 years. Colonoscopy: yes; normal. Mammogram within the last year: no. Up to date with pelvic exams: yes.  Past Medical History:  Diagnosis Date   Asthma    Bone spur    Cholelithiasis 99991111   Complication of anesthesia    pt had hypotensive episode and has had n & v   DVT (deep venous thrombosis) ([place]) 03/2019   x 1 and DEC 2011   Dysplastic nevus 01/21/2023   right super medial scalpula  mild atypia   Factor 5 Leiden mutation, heterozygous ([place])    stopped Xarelto 08/23/19   Fibroid    History of kidney stones    IDA (iron deficiency anemia)    vitamin b12 and iron deficiency   Migraines    PCOS (polycystic ovarian syndrome)    PCOS (polycystic ovarian syndrome)     Past Surgical History:  Procedure Laterality Date   CHOLECYSTECTOMY N/A 07/28/2019    Procedure: LAPAROSCOPIC CHOLECYSTECTOMY;  Surgeon: Jules Husbands, MD;  Location: [facility] ORS;  Service: General;  Laterality: N/A;   COLONOSCOPY WITH PROPOFOL N/A 11/13/2022   Procedure: COLONOSCOPY WITH PROPOFOL;  Surgeon: Annamaria Helling, DO;  Location: [facility] ENDOSCOPY;  Service: Gastroenterology;  Laterality: N/A;   DILATION AND CURETTAGE OF UTERUS     HYSTEROSCOPY WITH D & C N/A 08/31/2019   Procedure: DILATATION AND CURETTAGE /HYSTEROSCOPY/ SUBMUCOSAL MYOMECTOMY;  Surgeon: Will Bonnet, MD;  Location: [facility] ORS;  Service: Gynecology;  Laterality: N/A;   HYSTEROSCOPY WITH D & C N/A 10/03/2019   Procedure: DILATATION AND CURETTAGE /HYSTEROSCOPY/ SUBMUCOSAL MYOMECTOMY;  Surgeon: Will Bonnet, MD;  Location: [facility] ORS;  Service: Gynecology;  Laterality: N/A;   HYSTEROSCOPY WITH D & C N/A 10/26/2019   Procedure: DILATATION AND CURETTAGE /HYSTEROSCOPY, SUBMUCOSAL MYOMECTOMY;  Surgeon: Will Bonnet, MD;  Location: [facility] ORS;  Service: Gynecology;  Laterality: N/A;   MYOMECTOMY     PULMONARY THROMBECTOMY N/A 01/21/2021   Procedure: PULMONARY THROMBECTOMY;  Surgeon: Katha Cabal, MD;  Location: [place] CV LAB;  Service: Cardiovascular;  Laterality: N/A;   TONSILLECTOMY     WISDOM TOOTH EXTRACTION     WRIST SURGERY     age 70 - rebroke it b/c it grew back wrong    FAMILY HISTORY:  We obtained a detailed, 4-generation  family history.  Significant diagnoses are listed below: Family History  Problem Relation Age of Onset   Heart murmur Mother    Thyroid disease Mother    Migraines Mother    Asthma Mother    Diabetes Mother        TYPE 2   Bipolar disorder Mother    Cervical cancer Mother 2   Colon polyps Mother        >10   Breast cancer Maternal Aunt        dx 15s   Colon cancer Maternal Aunt        dx 18s   Diabetes Maternal Aunt    Endometrial cancer Maternal Aunt        dx 60s-70s   Melanoma Maternal Uncle        mult; first dx 61s   Colon cancer  Maternal Grandmother        dx 50s   Hypertension Maternal Grandmother    Heart attack Maternal Grandmother    Lung cancer Maternal Grandfather    [name] has 1 brother, 74, no cancers.   [name] mother had cervical cancer at 44 and is living at 75. She has also had >10 colon polyps. Patient has 4 maternal aunts and 1 uncle. One aunt had breast cancer in her 44s and is living in her 19s-70s. Another aunt had colon cancer in her 46s, endometrial in her 44s-70s, and is living at 23. No known genetic testing for either aunt. Maternal uncle has had multiple melanomas removed, his first in his 62s. Maternal grandmother had colon cancer in her 31s. Grandfather had lung cancer and history of smoking.  [name] father passed when she was young, she has no information about his side of the family.  [name] is unaware of previous family history of genetic testing for hereditary cancer risks. There is no reported Ashkenazi Jewish ancestry. There is no known consanguinity.    GENETIC COUNSELING ASSESSMENT: [name] is a 42 y.o. female with a family history of cancer which is somewhat suggestive of a hereditary cancer syndrome and predisposition to cancer. We, therefore, discussed and recommended the following at today's visit.   DISCUSSION: We discussed that approximately 10% of colon cancer is hereditary. Most cases of hereditary colon/uterine cancer are associated with Lynch syndrome genes, although there are other genes associated with hereditary cancer as well. Cancers and risks are gene specific. We discussed that testing is beneficial for several reasons including knowing about cancer risks, identifying potential screening and risk-reduction options that may be appropriate, and to understand if other family members could be at risk for cancer and allow them to undergo genetic testing.   We reviewed the characteristics, features and inheritance patterns of hereditary  cancer syndromes. We also discussed genetic testing, including the appropriate family members to test, the process of testing, insurance coverage and turn-around-time for results. We discussed the implications of a negative, positive and/or variant of uncertain significant result. We recommended Ms. Buss pursue genetic testing for the [facility] Multi-Cancer+RNA gene panel.   Based on Ms. Diaz's family history of cancer, she meets medical criteria for genetic testing. Despite that she meets criteria, she may still have an out of pocket cost. We discussed that if her out of pocket cost for testing is over $100, the laboratory will call and confirm whether she wants to proceed with testing.  If the out of pocket cost of testing is less than $100 she will be billed by  the genetic testing laboratory.   PLAN: After considering the risks, benefits, and limitations, Ms. Hollett provided informed consent to pursue genetic testing and the blood sample was sent to [facility] for analysis of the Multi-Cancer+RNA panel. Results should be available within approximately 2-3 weeks' time, at which point they will be disclosed by telephone to Ms. Spiewak, as will any additional recommendations warranted by these results. Ms. Slappey will receive a summary of her genetic counseling visit and a copy of her results once available. This information will also be available in Epic.   Ms. Deloera questions were answered to her satisfaction today. Our contact information was provided should additional questions or concerns arise. Thank you for the referral and allowing [place] to share in the care of your patient.   Faith Rogue, MS, [facility] Genetic Counselor [email or web address] Phone: [phone]  The patient was seen for a total of 20 minutes in virtual genetic counseling.  Dr. Grayland Ormond was available for discussion regarding this case.    _______________________________________________________________________ For Office Staff:  Number of people involved in session: 1 Was an Intern/ student involved with case: no

## 2023-02-05 NOTE — Progress Notes (Signed)
Last seen At the [facility] in 07/2021.  She has been seen at [facility]emat[facility] due to insurance coverage.  She was changed from Xarelto to Eliquis.

## 2023-02-17 ENCOUNTER — Telehealth: Payer: Self-pay | Admitting: Licensed Clinical Social Worker

## 2023-02-17 ENCOUNTER — Encounter: Payer: Self-pay | Admitting: Licensed Clinical Social Worker

## 2023-02-17 ENCOUNTER — Ambulatory Visit: Payer: Self-pay | Admitting: Licensed Clinical Social Worker

## 2023-02-17 DIAGNOSIS — Z1379 Encounter for other screening for genetic and chromosomal anomalies: Secondary | ICD-10-CM

## 2023-02-17 NOTE — Telephone Encounter (Signed)
I contacted Tara Shea to discuss her genetic testing results. No pathogenic variants were identified in the 70 genes analyzed. Detailed clinic note to follow.   The test report has been scanned into EPIC and is located under the Molecular Pathology section of the Results Review tab.  A portion of the result report is included below for reference.      Faith Rogue, MS, [facility] Genetic Counselor [email or web address] Phone: [phone]

## 2023-02-17 NOTE — Progress Notes (Signed)
HPI:   Ms. Banken was previously seen in the [place] clinic due to a family history of cancer and concerns regarding a hereditary predisposition to cancer. Please refer to our prior cancer genetics clinic note for more information regarding our discussion, assessment and recommendations, at the time. Ms. Fraleigh recent genetic test results were disclosed to her, as were recommendations warranted by these results. These results and recommendations are discussed in more detail below.  CANCER HISTORY:  Oncology History   No history exists.    FAMILY HISTORY:  We obtained a detailed, 4-generation family history.  Significant diagnoses are listed below: Family History  Problem Relation Age of Onset   Heart murmur Mother    Thyroid disease Mother    Migraines Mother    Asthma Mother    Diabetes Mother        TYPE 2   Bipolar disorder Mother    Cervical cancer Mother 75   Colon polyps Mother        >10   Breast cancer Maternal Aunt        dx 85s   Colon cancer Maternal Aunt        dx 21s   Diabetes Maternal Aunt    Endometrial cancer Maternal Aunt        dx 60s-70s   Melanoma Maternal Uncle        mult; first dx 67s   Colon cancer Maternal Grandmother        dx 45s   Hypertension Maternal Grandmother    Heart attack Maternal Grandmother    Lung cancer Maternal Grandfather    Ms. Knudson has 1 brother, 14, no cancers.    Ms. Katzen mother had cervical cancer at 68 and is living at 72. She has also had >10 colon polyps. Patient has 4 maternal aunts and 1 uncle. One aunt had breast cancer in her 9s and is living in her 50s-70s. Another aunt had colon cancer in her 8s, endometrial in her 68s-70s, and is living at 61. No known genetic testing for either aunt. Maternal uncle has had multiple melanomas removed, his first in his 21s. Maternal grandmother had colon cancer in her 26s. Grandfather had lung cancer and history of smoking.   Ms. Lauman  father passed when she was young, she has no information about his side of the family.   Ms. Terrill is unaware of previous family history of genetic testing for hereditary cancer risks. There is no reported Ashkenazi Jewish ancestry. There is no known consanguinity.      GENETIC TEST RESULTS:  The [facility] Multi-Cancer+RNA Panel found no pathogenic mutations.   The Multi-Cancer + RNA Panel offered by [facility] includes sequencing and/or deletion/duplication analysis of the following 70 genes:  AIP*, ALK, APC*, ATM*, AXIN2*, BAP1*, BARD1*, BLM*, BMPR1A*, BRCA1*, BRCA2*, BRIP1*, CDC73*, CDH1*, CDK4, CDKN1B*, CDKN2A, CHEK2*, CTNNA1*, DICER1*, EPCAM, EGFR, FH*, FLCN*, GREM1, HOXB13, KIT, LZTR1, MAX*, MBD4, MEN1*, MET, MITF, MLH1*, MSH2*, MSH3*, MSH6*, MUTYH*, NF1*, NF2*, NTHL1*, PALB2*, PDGFRA, PMS2*, POLD1*, POLE*, POT1*, PRKAR1A*, PTCH1*, PTEN*, RAD51C*, RAD51D*, RB1*, RET, SDHA*, SDHAF2*, SDHB*, SDHC*, SDHD*, SMAD4*, SMARCA4*, SMARCB1*, SMARCE1*, STK11*, SUFU*, TMEM127*, TP53*, TSC1*, TSC2*, VHL*. RNA analysis is performed for * genes.   The test report has been scanned into EPIC and is located under the Molecular Pathology section of the Results Review tab.  A portion of the result report is included below for reference. Genetic testing reported out on 02/12/2023.     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  At this time, it is unknown if this variant is associated with an increased risk for cancer or if it is benign, but most uncertain variants are reclassified to benign. It should not be used to make medical management decisions. With time, we suspect the laboratory will determine the significance of this variant, if any. If the laboratory reclassifies this variant, we will attempt to contact Ms. Mountz to discuss it further.   Even though a pathogenic variant was not identified, possible explanations for the cancer in the family may  include: There may be no hereditary risk for cancer in the family. The cancers in Ms. Stegeman and/or her family may be sporadic/familial or due to other genetic and environmental factors. There may be a gene mutation in one of these genes that current testing methods cannot detect but that chance is small. There could be another gene that has not yet been discovered, or that we have not yet tested, that is responsible for the cancer diagnoses in the family.  It is also possible there is a hereditary cause for the cancer in the family that Ms. Bovard did not inherit.  Therefore, it is important to remain in touch with cancer genetics in the future so that we can continue to offer Ms. Barco the most up to date genetic testing.   ADDITIONAL GENETIC TESTING:  We discussed with Ms. Ollila that her genetic testing was fairly extensive.  If there are additional relevant genes identified to increase cancer risk that can be analyzed in the future, we would be happy to discuss and coordinate this testing at that time.    CANCER SCREENING RECOMMENDATIONS:  Ms. Dobbertin test result is considered negative (normal).  This means that we have not identified a hereditary cause for her family history of cancer at this time.   An individual's cancer risk and medical management are not determined by genetic test results alone. Overall cancer risk assessment incorporates additional factors, including personal medical history, family history, and any available genetic information that may result in a personalized plan for cancer prevention and surveillance. Therefore, it is recommended she continue to follow the cancer management and screening guidelines provided by her  primary healthcare provider.  RECOMMENDATIONS FOR FAMILY MEMBERS:   Individuals in this family might be at some increased risk of developing cancer, over the general population risk, due to the family history of cancer.  Individuals in the  family should notify their providers of the family history of cancer. We recommend women in this family have a yearly mammogram beginning at age 35, or 71 years younger than the earliest onset of cancer, an annual clinical breast exam, and perform monthly breast self-exams.  Family members should have colonoscopies by at age 58, or earlier, as recommended by their providers. Other members of the family may still carry a pathogenic variant in one of these genes that Ms. Danker did not inherit. Based on the family history, we recommend her maternal relatives, especially those who  have had cancer,  have genetic counseling and testing. Ms. Irey will let us know if we can be of any assistance in coordinating genetic counseling and/or testing for this family member.   We do not recommend familial testing for the SMARCE1 variant of uncertain significance (VUS).  FOLLOW-UP:  Lastly, we discussed with Ms. Gardino that cancer genetics is a rapidly advancing field and it is possible that new genetic tests will be appropriate for  her and/or her family members in the future. We encouraged her to remain in contact with cancer genetics on an annual basis so we can update her personal and family histories and let her know of advances in cancer genetics that may benefit this family.   Our contact number was provided. Ms. Kret questions were answered to her satisfaction, and she knows she is welcome to call us at anytime with additional questions or concerns.    Faith Rogue, MS, [facility] Genetic Counselor [email or web address] Phone: [phone]

## 2023-03-11 ENCOUNTER — Encounter: Payer: Self-pay | Admitting: Licensed Clinical Social Worker

## 2023-04-15 ENCOUNTER — Encounter: Payer: 59 | Admitting: Nurse Practitioner

## 2023-04-21 ENCOUNTER — Encounter: Payer: Self-pay | Admitting: Internal Medicine

## 2023-04-27 NOTE — Progress Notes (Unsigned)
Name: Tara Shea   MRN: [id number]    DOB: July 24, 1981   Date:04/28/2023       Progress Note  Subjective  Chief Complaint  Chief Complaint  Patient presents with   Annual Exam    HPI  Patient presents for annual CPE.  Diet: well balanced diet, she is doing the lose it app, she has been working on weight loss.   Exercise: walks daily 1-2 miles, she is also walking during lunch break and she also is going to the gym for about an hour  Sleep: 6 hours Last dental exam: due Last eye exam: November 2023  Flowsheet Row Office Visit from 04/28/2023 in [facility]  AUDIT-C Score 0      Depression: Phq 9 is  negative    04/28/2023    8:57 AM 04/13/2022   11:49 AM 09/23/2021    8:14 AM 09/10/2021    8:23 AM 05/27/2021    9:28 AM  Depression screen PHQ 2/9  Decreased Interest 0 0 0 0 0  Down, Depressed, Hopeless 0 0 0 0 0  PHQ - 2 Score 0 0 0 0 0  Altered sleeping   0 0 0  Tired, decreased energy   0 0 0  Change in appetite   0 0 0  Feeling bad or failure about yourself    0 0 0  Trouble concentrating   0 0 0  Moving slowly or fidgety/restless   0 0 0  Suicidal thoughts   0 0 0  PHQ-9 Score   0 0 0  Difficult doing work/chores   Not difficult at all Not difficult at all Not difficult at all   Hypertension: BP Readings from Last 3 Encounters:  04/28/23 128/74  02/05/23 (!) 126/91  11/13/22 117/72   Obesity: Wt Readings from Last 3 Encounters:  04/28/23 295 lb 3.2 oz (133.9 kg)  02/05/23 (!) 331 lb 12.8 oz (150.5 kg)  11/13/22 (!) 336 lb 11 oz (152.7 kg)   BMI Readings from Last 3 Encounters:  04/28/23 44.89 kg/m  02/05/23 50.45 kg/m  11/13/22 51.19 kg/m     Vaccines:  HPV: up to at age 43 , ask insurance if age between 43-45  Shingrix: 43-64 yo and ask insurance if covered when patient above 43 yo Pneumonia:  educated and discussed with patient. Flu:  educated and discussed with patient.  Hep C Screening: 04/13/2022 STD testing  and prevention (HIV/chl/gon/syphilis): 01/21/2021 Intimate partner violence: none Sexual History : yes Menstrual History/LMP/Abnormal Bleeding: [facility]: ended on Friday,  regular Incontinence Symptoms: none  Breast cancer:  - Last Mammogram: due, ordered - BRCA gene screening: none  Osteoporosis: Discussed high calcium and vitamin D supplementation, weight bearing exercises  Cervical cancer screening: 06/29/2018  Skin cancer: Discussed monitoring for atypical lesions, she does go to the dermatologist Colorectal cancer: colonoscopy in December, due to family history Lung cancer:   Low Dose CT Chest recommended if Age 5-80 years, 20 pack-year currently smoking OR have quit w/in 15years. Patient does not qualify.   ECG: 01/22/2021  Advanced Care Planning: A voluntary discussion about advance care planning including the explanation and discussion of advance directives.  Discussed health care proxy and Living will, and the patient was able to identify a health care proxy as husband.  Patient does not have a living will at present time. If patient does have living will, I have requested they bring this to the clinic to be scanned in to their  chart.  Lipids: Lab Results  Component Value Date   CHOL 194 04/13/2022   CHOL 184 08/02/2018   Lab Results  Component Value Date   HDL 71 04/13/2022   HDL 64 08/02/2018   Lab Results  Component Value Date   LDLCALC 105 (H) 04/13/2022   LDLCALC 103 (H) 08/02/2018   Lab Results  Component Value Date   TRIG 88 04/13/2022   TRIG 76 08/02/2018   Lab Results  Component Value Date   CHOLHDL 2.7 04/13/2022   CHOLHDL 2.9 08/02/2018   No results found for: "LDLDIRECT"  Glucose: Glucose  Date Value Ref Range Status  09/14/2013 106 (H) 65 - 99 mg/dL Final   Glucose, Bld  Date Value Ref Range Status  02/05/2023 124 (H) 70 - 99 mg/dL Final    Comment:    Glucose reference range applies only to samples taken after fasting for at least 8 hours.   04/13/2022 99 65 - 99 mg/dL Final    Comment:    .            Fasting reference interval .   11/18/2021 121 (H) 70 - 99 mg/dL Final    Comment:    Glucose reference range applies only to samples taken after fasting for at least 8 hours.   Glucose-Capillary  Date Value Ref Range Status  01/21/2021 87 70 - 99 mg/dL Final    Comment:    Glucose reference range applies only to samples taken after fasting for at least 8 hours.    Patient Active Problem List   Diagnosis Date Noted   Genetic testing 02/17/2023   Arthralgia of knee, left 09/23/2021   Popliteal cyst, left 09/23/2021   Adverse reaction to COVID-19 vaccine 01/28/2021   Pulmonary embolism (HCC) 01/21/2021   Shortness of breath 01/20/2021   Acute pulmonary embolus (HCC) 01/20/2021   DVT (deep venous thrombosis) (HCC) 01/20/2021   Acute pulmonary embolism (HCC) 01/20/2021   Infertility, female 11/26/2020   PCOS (polycystic ovarian syndrome) 11/26/2020   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/25/2020   Intramural and submucous leiomyoma of uterus 08/31/2019   Menorrhagia with irregular cycle 08/31/2019   Dysmenorrhea 08/31/2019   B12 deficiency 08/09/2019   Iron deficiency anemia due to chronic blood loss 06/22/2019   Acute deep vein thrombosis (DVT) of calf muscle vein of right lower extremity (HCC) 04/18/2019   Factor V Leiden mutation (HCC)    Allergic rhinitis 03/01/2019   Kidney stone 08/02/2018   Asthma, mild intermittent 08/02/2018   Migraine headache with aura 08/02/2018   Morbid obesity (HCC) 08/02/2018    Past Surgical History:  Procedure Laterality Date   CHOLECYSTECTOMY N/A 07/28/2019   Procedure: LAPAROSCOPIC CHOLECYSTECTOMY;  Surgeon: Leafy Ro, MD;  Location: [facility] ORS;  Service: General;  Laterality: N/A;   COLONOSCOPY WITH PROPOFOL N/A 11/13/2022   Procedure: COLONOSCOPY WITH PROPOFOL;  Surgeon: Jaynie Collins, DO;  Location: [facility] ENDOSCOPY;  Service: Gastroenterology;  Laterality: N/A;   DILATION  AND CURETTAGE OF UTERUS     HYSTEROSCOPY WITH D & C N/A 08/31/2019   Procedure: DILATATION AND CURETTAGE /HYSTEROSCOPY/ SUBMUCOSAL MYOMECTOMY;  Surgeon: Conard Novak, MD;  Location: [facility] ORS;  Service: Gynecology;  Laterality: N/A;   HYSTEROSCOPY WITH D & C N/A 10/03/2019   Procedure: DILATATION AND CURETTAGE /HYSTEROSCOPY/ SUBMUCOSAL MYOMECTOMY;  Surgeon: Conard Novak, MD;  Location: [facility] ORS;  Service: Gynecology;  Laterality: N/A;   HYSTEROSCOPY WITH D & C N/A 10/26/2019   Procedure: DILATATION AND  CURETTAGE /HYSTEROSCOPY, SUBMUCOSAL MYOMECTOMY;  Surgeon: Conard Novak, MD;  Location: [facility] ORS;  Service: Gynecology;  Laterality: N/A;   MYOMECTOMY     PULMONARY THROMBECTOMY N/A 01/21/2021   Procedure: PULMONARY THROMBECTOMY;  Surgeon: Renford Dills, MD;  Location: [facility] INVASIVE CV LAB;  Service: Cardiovascular;  Laterality: N/A;   TONSILLECTOMY     WISDOM TOOTH EXTRACTION     WRIST SURGERY     age 85 - rebroke it b/c it grew back wrong    Family History  Problem Relation Age of Onset   Heart murmur Mother    Thyroid disease Mother    Migraines Mother    Asthma Mother    Diabetes Mother        TYPE 2   Bipolar disorder Mother    Cervical cancer Mother 51   Colon polyps Mother        >10   Breast cancer Maternal Aunt        dx 73s   Colon cancer Maternal Aunt        dx 57s   Diabetes Maternal Aunt    Endometrial cancer Maternal Aunt        dx 60s-70s   Melanoma Maternal Uncle        mult; first dx 30s   Colon cancer Maternal Grandmother        dx 65s   Hypertension Maternal Grandmother    Heart attack Maternal Grandmother    Lung cancer Maternal Grandfather     Social History   Socioeconomic History   Marital status: Married    Spouse name: Velda Spargur   Number of children: 0   Years of education: 16   Highest education level: Bachelor's degree (e.g., BA, AB, BS)  Occupational History   Occupation: Teacher, adult education: [employer]    Comment:  ED    (works at [employer])  Tobacco Use   Smoking status: Never   Smokeless tobacco: Never  Vaping Use   Vaping Use: Never used  Substance and Sexual Activity   Alcohol use: Not Currently   Drug use: Never   Sexual activity: Yes    Partners: Male  Other Topics Concern   Not on file  Social History Narrative    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  No children.       04/28/2023   Works at [employer] in [employer]       Social Determinants of [employer]: Low Risk  (04/28/2023)   Overall Financial Resource Strain (CARDIA)    Difficulty of Paying Living Expenses: Not hard at all  Food Insecurity: No Food Insecurity (04/28/2023)   Hunger Vital Sign    Worried About Running Out of Food in the Last Year: Never true    Ran Out of Food in the Last Year: Never true  Transportation Needs: No Transportation Needs (04/28/2023)   PRAPARE - Administrator, Civil Service (Medical): No    Lack of Transportation (Non-Medical): No  Physical Activity: Sufficiently Active (04/28/2023)   Exercise Vital Sign    Days of Exercise per Week: 7 days    Minutes of Exercise per Session: 120 min  Stress: No Stress Concern Present (04/28/2023)   [employer] of Occupational Health - Occupational Stress Questionnaire    Feeling of Stress : Only a little  Social Connections: Moderately Integrated (04/13/2022)   Social Connection and Isolation Panel [NHANES]    Frequency of Communication  with Friends and Family: More than three times a week    Frequency of Social Gatherings with Friends and Family: Once a week    Attends Religious Services: 1 to 4 times per year    Active Member of [employer] or Organizations: No    Attends Banker Meetings: Never    Marital Status: Married  Catering manager Violence: Not At Risk (04/28/2023)   Humiliation, Afraid, Rape, and Kick questionnaire    Fear of Current or Ex-Partner: No    Emotionally Abused: No    Physically Abused: No     Sexually Abused: No     Current Outpatient Medications:    albuterol (PROVENTIL) (2.5 MG/3ML) 0.083% nebulizer solution, Take 3 mLs (2.5 mg total) by nebulization every 6 (six) hours as needed for wheezing or shortness of breath., Disp: 75 mL, Rfl: 6   albuterol (VENTOLIN HFA) 108 (90 Base) MCG/ACT inhaler, INHALE 2 PUFFS BY MOUTH EVERY 4 HOURS AS NEEDED FOR WHEEZE OR FOR SHORTNESS OF BREATH, Disp: 8.5 each, Rfl: 2   apixaban (ELIQUIS) 5 MG TABS tablet, Take 1 tablet (5 mg total) by mouth 2 (two) times daily., Disp: 60 tablet, Rfl: 10   ferrous sulfate 325 (65 FE) MG tablet, Take 325 mg by mouth daily., Disp: , Rfl:    Multiple Vitamins-Minerals (MULTIVITAMIN WITH MINERALS) tablet, Take 1 tablet by mouth daily., Disp: , Rfl:    ondansetron (ZOFRAN ODT) 4 MG disintegrating tablet, Take 1 tablet (4 mg total) by mouth every 6 (six) hours as needed for nausea., Disp: 60 tablet, Rfl: 1   promethazine (PHENERGAN) 25 MG tablet, Take 1 tablet (25 mg total) by mouth every 8 (eight) hours as needed for nausea or vomiting., Disp: 60 tablet, Rfl: 1   Spacer/Aero-Holding Chambers (AEROCHAMBER PLUS) inhaler, Use as instructed, Disp: 1 each, Rfl: 2   vitamin B-12 (CYANOCOBALAMIN) 100 MCG tablet, Take 100 mcg by mouth daily., Disp: , Rfl:    vitamin C (ASCORBIC ACID) 500 MG tablet, Take 500 mg by mouth daily., Disp: , Rfl:    amoxicillin-clavulanate (AUGMENTIN) 875-125 MG tablet, Take 1 tablet by mouth 2 (two) times daily. (Patient not taking: Reported on 04/28/2023), Disp: 20 tablet, Rfl: 0   cephALEXin (KEFLEX) 500 MG capsule, Take 1 capsule (500 mg total) by mouth 2 (two) times daily. (Patient not taking: Reported on 04/28/2023), Disp: 14 capsule, Rfl: 0   COVID-19 At Home Antigen Test (CARESTART COVID-19 HOME TEST) KIT, Use as directed (Patient not taking: Reported on 04/28/2023), Disp: 2 kit, Rfl: 0   fluconazole (DIFLUCAN) 150 MG tablet, Take 1 tablet (150 mg total) by mouth every 3 (three) days as needed.  (Patient not taking: Reported on 11/13/2022), Disp: 2 tablet, Rfl: 0   fluticasone (FLOVENT HFA) 110 MCG/ACT inhaler, Inhale 1 puff into the lungs 2 (two) times daily. (Patient not taking: Reported on 11/13/2022), Disp: 12 g, Rfl: 0   mupirocin ointment (BACTROBAN) 2 %, Apply to skin qd-bid (Patient not taking: Reported on 04/28/2023), Disp: 22 g, Rfl: 2  Allergies  Allergen Reactions   Covid-19 (Mrna) Vaccine Itching and Other (See Comments)    Lip swelling and tingling, throat itching, itching and redness on face, scalp and chest.      ROS  Constitutional: Negative for fever or weight change.  Respiratory: Negative for cough and shortness of breath.   Cardiovascular: Negative for chest pain or palpitations.  Gastrointestinal: Negative for abdominal pain, no bowel changes.  Musculoskeletal: Negative for gait problem or  joint swelling.  Skin: Negative for rash.  Neurological: Negative for dizziness or headache.  No other specific complaints in a complete review of systems (except as listed in HPI above).   Objective  Vitals:   04/28/23 0848  BP: 128/74  Pulse: 96  Resp: 16  Temp: 97.7 F (36.5 C)  TempSrc: Oral  SpO2: 95%  Weight: 295 lb 3.2 oz (133.9 kg)  Height: 5\' 8"  (1.727 m)    Body mass index is 44.89 kg/m.  Physical Exam Constitutional: Patient appears well-developed and well-nourished. No distress.  HENT: Head: Normocephalic and atraumatic. Ears: B TMs ok, no erythema or effusion; Nose: Nose normal. Mouth/Throat: Oropharynx is clear and moist. No oropharyngeal exudate.  Eyes: Conjunctivae and EOM are normal. Pupils are equal, round, and reactive to light. No scleral icterus.  Neck: Normal range of motion. Neck supple. No JVD present. No thyromegaly present.  Cardiovascular: Normal rate, regular rhythm and normal heart sounds.  No murmur heard. No BLE edema. Pulmonary/Chest: Effort normal and breath sounds normal. No respiratory distress. Abdominal: Soft. Bowel  sounds are normal, no distension. There is no tenderness. no masses Breast: no lumps or masses, no nipple discharge or rashes Musculoskeletal: Normal range of motion, no joint effusions. No gross deformities Neurological: he is alert and oriented to person, place, and time. No cranial nerve deficit. Coordination, balance, strength, speech and gait are normal.  Skin: Skin is warm and dry. No rash noted. No erythema.  Psychiatric: Patient has a normal mood and affect. behavior is normal. Judgment and thought content normal.   Recent Results (from the past 2160 hour(s))  Basic Metabolic Panel - Cancer Center Only     Status: Abnormal   Collection Time: 02/05/23 11:01 AM  Result Value Ref Range   Sodium 135 135 - 145 mmol/L   Potassium 3.7 3.5 - 5.1 mmol/L   Chloride 105 98 - 111 mmol/L   CO2 23 22 - 32 mmol/L   Glucose, Bld 124 (H) 70 - 99 mg/dL    Comment: Glucose reference range applies only to samples taken after fasting for at least 8 hours.   BUN 14 6 - 20 mg/dL   Creatinine 0.34 7.42 - 1.00 mg/dL   Calcium 8.9 8.9 - 59.5 mg/dL   GFR, Estimated >63 >87 mL/min    Comment: (NOTE) Calculated using the CKD-EPI Creatinine Equation (2021)    Anion gap 7 5 - 15    Comment: Performed at [facility], [address] [place], [place] [address]  CBC with Differential (Cancer Center Only)     Status: None   Collection Time: 02/05/23 11:01 AM  Result Value Ref Range   WBC Count 5.4 4.0 - 10.5 K/uL   RBC 4.42 3.87 - 5.11 MIL/uL   Hemoglobin 12.3 12.0 - 15.0 g/dL   HCT 29.5 18.8 - 41.6 %   MCV 86.9 80.0 - 100.0 fL   MCH 27.8 26.0 - 34.0 pg   MCHC 32.0 30.0 - 36.0 g/dL   RDW 60.6 30.1 - 60.1 %   Platelet Count 224 150 - 400 K/uL   nRBC 0.0 0.0 - 0.2 %   Neutrophils Relative % 64 %   Neutro Abs 3.5 1.7 - 7.7 K/uL   Lymphocytes Relative 26 %   Lymphs Abs 1.4 0.7 - 4.0 K/uL   Monocytes Relative 6 %   Monocytes Absolute 0.3 0.1 - 1.0 K/uL   Eosinophils Relative 3 %   Eosinophils  Absolute 0.2 0.0 - 0.5  K/uL   Basophils Relative 1 %   Basophils Absolute 0.0 0.0 - 0.1 K/uL   Immature Granulocytes 0 %   Abs Immature Granulocytes 0.02 0.00 - 0.07 K/uL    Comment: Performed at [facility], [address] [place], [place] [address]     Fall Risk:    04/28/2023    8:57 AM 04/13/2022   11:48 AM 09/23/2021    8:14 AM 09/10/2021    8:23 AM 05/27/2021    9:28 AM  Fall Risk   Falls in the past year? 0 0 0 0 0  Number falls in past yr: 0 0 0 0 0  Injury with Fall? 0 0 0 0 0  Risk for fall due to :   Orthopedic patient No Fall Risks   Follow up  Falls evaluation completed Falls evaluation completed Falls prevention discussed     Functional Status Survey: Is the patient deaf or have difficulty hearing?: No Does the patient have difficulty seeing, even when wearing glasses/contacts?: No Does the patient have difficulty concentrating, remembering, or making decisions?: No Does the patient have difficulty walking or climbing stairs?: No Does the patient have difficulty dressing or bathing?: No Does the patient have difficulty doing errands alone such as visiting a doctor's office or shopping?: No   Assessment & Plan  1. Annual physical exam  - CBC with Differential/Platelet - COMPLETE METABOLIC PANEL WITH GFR - Lipid panel - Hemoglobin A1c - MM 3D SCREENING MAMMOGRAM BILATERAL BREAST; Future  2. Screening for diabetes mellitus  - COMPLETE METABOLIC PANEL WITH GFR - Hemoglobin A1c  3. Screening for deficiency anemia  - CBC with Differential/Platelet  4. Screening for cholesterol level  - Lipid panel  5. Encounter for screening mammogram for malignant neoplasm of breast  - MM 3D SCREENING MAMMOGRAM BILATERAL BREAST; Future  6. Need for Tdap vaccination  - Tdap vaccine greater than or equal to 7yo IM   -USPSTF grade A and B recommendations reviewed with patient; age-appropriate recommendations, preventive care, screening tests, etc discussed and  encouraged; healthy living encouraged; see AVS for patient education given to patient -Discussed importance of 150 minutes of physical activity weekly, eat two servings of fish weekly, eat one serving of tree nuts ( cashews, pistachios, pecans, almonds.Marland Kitchen) every other day, eat 6 servings of fruit/vegetables daily and drink plenty of water and avoid sweet beverages.

## 2023-04-28 ENCOUNTER — Encounter: Payer: Self-pay | Admitting: Nurse Practitioner

## 2023-04-28 ENCOUNTER — Encounter: Payer: Self-pay | Admitting: Internal Medicine

## 2023-04-28 ENCOUNTER — Other Ambulatory Visit: Payer: Self-pay

## 2023-04-28 ENCOUNTER — Ambulatory Visit (INDEPENDENT_AMBULATORY_CARE_PROVIDER_SITE_OTHER): Payer: Federal, State, Local not specified - PPO | Admitting: Nurse Practitioner

## 2023-04-28 VITALS — BP 128/74 | HR 96 | Temp 97.7°F | Resp 16 | Ht 68.0 in | Wt 295.2 lb

## 2023-04-28 DIAGNOSIS — Z23 Encounter for immunization: Secondary | ICD-10-CM | POA: Diagnosis not present

## 2023-04-28 DIAGNOSIS — Z131 Encounter for screening for diabetes mellitus: Secondary | ICD-10-CM

## 2023-04-28 DIAGNOSIS — Z13220 Encounter for screening for lipoid disorders: Secondary | ICD-10-CM | POA: Diagnosis not present

## 2023-04-28 DIAGNOSIS — Z0000 Encounter for general adult medical examination without abnormal findings: Secondary | ICD-10-CM | POA: Diagnosis not present

## 2023-04-28 DIAGNOSIS — Z130 Encounter for screening for diseases of the blood and blood-forming organs and certain disorders involving the immune mechanism: Secondary | ICD-10-CM | POA: Diagnosis not present

## 2023-04-28 DIAGNOSIS — Z1231 Encounter for screening mammogram for malignant neoplasm of breast: Secondary | ICD-10-CM

## 2023-04-29 LAB — COMPLETE METABOLIC PANEL WITH GFR
AG Ratio: 1.6 (calc) (ref 1.0–2.5)
ALT: 15 U/L (ref 6–29)
AST: 17 U/L (ref 10–30)
Albumin: 4.1 g/dL (ref 3.6–5.1)
Alkaline phosphatase (APISO): 49 U/L (ref 31–125)
BUN: 16 mg/dL (ref 7–25)
CO2: 24 mmol/L (ref 20–32)
Calcium: 9.4 mg/dL (ref 8.6–10.2)
Chloride: 106 mmol/L (ref 98–110)
Creat: 0.71 mg/dL (ref 0.50–0.99)
Globulin: 2.5 g/dL (calc) (ref 1.9–3.7)
Glucose, Bld: 109 mg/dL — ABNORMAL HIGH (ref 65–99)
Potassium: 4.2 mmol/L (ref 3.5–5.3)
Sodium: 141 mmol/L (ref 135–146)
Total Bilirubin: 0.3 mg/dL (ref 0.2–1.2)
Total Protein: 6.6 g/dL (ref 6.1–8.1)
eGFR: 109 mL/min/{1.73_m2} (ref >=60)

## 2023-04-29 LAB — CBC WITH DIFFERENTIAL/PLATELET
Absolute Monocytes: 246 cells/uL (ref 200–950)
Basophils Absolute: 20 cells/uL (ref 0–200)
Basophils Relative: 0.5 %
Eosinophils Absolute: 207 cells/uL (ref 15–500)
Eosinophils Relative: 5.3 %
HCT: 37.1 % (ref 35.0–45.0)
Hemoglobin: 12 g/dL (ref 11.7–15.5)
Lymphs Abs: 1287 cells/uL (ref 850–3900)
MCH: 28.4 pg (ref 27.0–33.0)
MCHC: 32.3 g/dL (ref 32.0–36.0)
MCV: 87.9 fL (ref 80.0–100.0)
MPV: 10.6 fL (ref 7.5–12.5)
Monocytes Relative: 6.3 %
Neutro Abs: 2141 cells/uL (ref 1500–7800)
Neutrophils Relative %: 54.9 %
Platelets: 235 10*3/uL (ref 140–400)
RBC: 4.22 10*6/uL (ref 3.80–5.10)
RDW: 13.1 % (ref 11.0–15.0)
Total Lymphocyte: 33 %
WBC: 3.9 10*3/uL (ref 3.8–10.8)

## 2023-04-29 LAB — LIPID PANEL
Cholesterol: 172 mg/dL (ref <200)
HDL: 46 mg/dL — ABNORMAL LOW (ref >=50)
LDL Cholesterol (Calc): 107 mg/dL (calc) — ABNORMAL HIGH
Non-HDL Cholesterol (Calc): 126 mg/dL (calc) (ref <130)
Total CHOL/HDL Ratio: 3.7 (calc) (ref <5.0)
Triglycerides: 96 mg/dL (ref <150)

## 2023-04-29 LAB — HEMOGLOBIN A1C
Hgb A1c MFr Bld: 5.3 % of total Hgb (ref <5.7)
Mean Plasma Glucose: 105 mg/dL
eAG (mmol/L): 5.8 mmol/L

## 2023-06-10 ENCOUNTER — Ambulatory Visit
Admission: RE | Admit: 2023-06-10 | Discharge: 2023-06-10 | Disposition: A | Discharge status: Home / Self Care | Payer: Federal, State, Local not specified - PPO | Source: Ambulatory Visit | Attending: Nurse Practitioner | Admitting: Nurse Practitioner

## 2023-06-10 DIAGNOSIS — Z1231 Encounter for screening mammogram for malignant neoplasm of breast: Secondary | ICD-10-CM | POA: Insufficient documentation

## 2023-06-10 DIAGNOSIS — Z0000 Encounter for general adult medical examination without abnormal findings: Secondary | ICD-10-CM | POA: Diagnosis not present

## 2023-06-11 ENCOUNTER — Encounter: Payer: Self-pay | Admitting: Gerontology

## 2023-06-11 ENCOUNTER — Other Ambulatory Visit: Payer: Self-pay | Admitting: Nurse Practitioner

## 2023-06-11 DIAGNOSIS — R928 Other abnormal and inconclusive findings on diagnostic imaging of breast: Secondary | ICD-10-CM

## 2023-06-11 DIAGNOSIS — N6489 Other specified disorders of breast: Secondary | ICD-10-CM

## 2023-06-24 ENCOUNTER — Ambulatory Visit
Admission: RE | Admit: 2023-06-24 | Discharge: 2023-06-24 | Disposition: A | Discharge status: Home / Self Care | Payer: Federal, State, Local not specified - PPO | Source: Ambulatory Visit | Attending: Nurse Practitioner | Admitting: Nurse Practitioner

## 2023-06-24 DIAGNOSIS — N6489 Other specified disorders of breast: Secondary | ICD-10-CM | POA: Insufficient documentation

## 2023-06-24 DIAGNOSIS — R928 Other abnormal and inconclusive findings on diagnostic imaging of breast: Secondary | ICD-10-CM | POA: Diagnosis not present

## 2023-06-24 DIAGNOSIS — R92322 Mammographic fibroglandular density, left breast: Secondary | ICD-10-CM | POA: Diagnosis not present

## 2023-07-09 DIAGNOSIS — K080 Exfoliation of teeth due to systemic causes: Secondary | ICD-10-CM | POA: Diagnosis not present

## 2023-07-22 ENCOUNTER — Encounter: Payer: Self-pay | Admitting: Nurse Practitioner

## 2023-07-23 ENCOUNTER — Other Ambulatory Visit: Payer: Self-pay | Admitting: Nurse Practitioner

## 2023-07-23 DIAGNOSIS — Z86718 Personal history of other venous thrombosis and embolism: Secondary | ICD-10-CM

## 2023-07-23 DIAGNOSIS — D6851 Activated protein C resistance: Secondary | ICD-10-CM

## 2023-07-23 DIAGNOSIS — Z86711 Personal history of pulmonary embolism: Secondary | ICD-10-CM

## 2023-07-23 MED ORDER — APIXABAN 5 MG PO TABS
5.0000 mg | ORAL_TABLET | Freq: Two times a day (BID) | ORAL | 3 refills | Status: DC
Start: 2023-07-23 — End: 2024-09-07

## 2023-08-12 ENCOUNTER — Ambulatory Visit: Payer: Federal, State, Local not specified - PPO | Admitting: Internal Medicine

## 2023-08-12 ENCOUNTER — Other Ambulatory Visit: Payer: Federal, State, Local not specified - PPO

## 2023-09-24 ENCOUNTER — Other Ambulatory Visit: Payer: Self-pay

## 2023-09-24 ENCOUNTER — Ambulatory Visit: Payer: Federal, State, Local not specified - PPO | Admitting: Nurse Practitioner

## 2023-09-24 VITALS — BP 126/82 | HR 79 | Temp 97.8°F | Resp 16 | Ht 68.0 in | Wt 252.8 lb

## 2023-09-24 DIAGNOSIS — Z3141 Encounter for fertility testing: Secondary | ICD-10-CM | POA: Diagnosis not present

## 2023-09-24 DIAGNOSIS — D171 Benign lipomatous neoplasm of skin and subcutaneous tissue of trunk: Secondary | ICD-10-CM

## 2023-09-24 DIAGNOSIS — L0291 Cutaneous abscess, unspecified: Secondary | ICD-10-CM

## 2023-09-24 MED ORDER — DOXYCYCLINE HYCLATE 100 MG PO TABS
100.0000 mg | ORAL_TABLET | Freq: Two times a day (BID) | ORAL | 0 refills | Status: DC
Start: 2023-09-24 — End: 2023-10-04

## 2023-09-24 NOTE — Progress Notes (Signed)
BP 126/82   Pulse 79   Temp 97.8 F (36.6 C) (Oral)   Resp 16   Ht 5\' 8"  (1.727 m)   Wt 252 lb 12.8 oz (114.7 kg)   SpO2 98%   BMI 38.44 kg/m    Subjective:    Patient ID: Tara Shea, female    DOB: January 29, 1981, 42 y.o.   MRN: [id number]  HPI: Tara Shea is a 42 y.o. female  Chief Complaint  Patient presents with   Cyst    On back   Cyst/lipoma/abscess?:  patient reports she has had a lump on her left side of her back under her bra line she says it has been there for 3-4 months. patient reports she initially thought it was a lipoma but states now it is red, itching and painful.  Will treat prophylacticly with doxycyline and place referral to surgery for removal.    Relevant past medical, surgical, family and social history reviewed and updated as indicated. Interim medical history since our last visit reviewed. Allergies and medications reviewed and updated.  Review of Systems  Ten systems reviewed and is negative except as mentioned in HPI       Objective:    BP 126/82   Pulse 79   Temp 97.8 F (36.6 C) (Oral)   Resp 16   Ht 5\' 8"  (1.727 m)   Wt 252 lb 12.8 oz (114.7 kg)   SpO2 98%   BMI 38.44 kg/m   Wt Readings from Last 3 Encounters:  09/24/23 252 lb 12.8 oz (114.7 kg)  04/28/23 295 lb 3.2 oz (133.9 kg)  02/05/23 (!) 331 lb 12.8 oz (150.5 kg)    Physical Exam  Constitutional: Patient appears well-developed and well-nourished. Obese  No distress.  HEENT: head atraumatic, normocephalic, pupils equal and reactive to light, neck supple Cardiovascular: Normal rate, regular rhythm and normal heart sounds.  No murmur heard. No BLE edema. Pulmonary/Chest: Effort normal and breath sounds normal. No respiratory distress. Abdominal: Soft.  There is no tenderness. Skin: cystic lump noted on left upper back below shoulder blade Psychiatric: Patient has a normal mood and affect. behavior is normal. Judgment and thought content normal.   Results  for orders placed or performed in visit on 04/28/23  CBC with Differential/Platelet  Result Value Ref Range   WBC 3.9 3.8 - 10.8 Thousand/uL   RBC 4.22 3.80 - 5.10 Million/uL   Hemoglobin 12.0 11.7 - 15.5 g/dL   HCT 78.2 95.6 - 21.3 %   MCV 87.9 80.0 - 100.0 fL   MCH 28.4 27.0 - 33.0 pg   MCHC 32.3 32.0 - 36.0 g/dL   RDW 08.6 57.8 - 46.9 %   Platelets 235 140 - 400 Thousand/uL   MPV 10.6 7.5 - 12.5 fL   Neutro Abs 2,141 1,500 - 7,800 cells/uL   Lymphs Abs 1,287 850 - 3,900 cells/uL   Absolute Monocytes 246 200 - 950 cells/uL   Eosinophils Absolute 207 15 - 500 cells/uL   Basophils Absolute 20 0 - 200 cells/uL   Neutrophils Relative % 54.9 %   Total Lymphocyte 33.0 %   Monocytes Relative 6.3 %   Eosinophils Relative 5.3 %   Basophils Relative 0.5 %  COMPLETE METABOLIC PANEL WITH GFR  Result Value Ref Range   Glucose, Bld 109 (H) 65 - 99 mg/dL   BUN 16 7 - 25 mg/dL   Creat 6.29 5.28 - 4.13 mg/dL   eGFR 244 > OR = 60 WN/UUV/2.53G6  BUN/Creatinine Ratio SEE NOTE: 6 - 22 (calc)   Sodium 141 135 - 146 mmol/L   Potassium 4.2 3.5 - 5.3 mmol/L   Chloride 106 98 - 110 mmol/L   CO2 24 20 - 32 mmol/L   Calcium 9.4 8.6 - 10.2 mg/dL   Total Protein 6.6 6.1 - 8.1 g/dL   Albumin 4.1 3.6 - 5.1 g/dL   Globulin 2.5 1.9 - 3.7 g/dL (calc)   AG Ratio 1.6 1.0 - 2.5 (calc)   Total Bilirubin 0.3 0.2 - 1.2 mg/dL   Alkaline phosphatase (APISO) 49 31 - 125 U/L   AST 17 10 - 30 U/L   ALT 15 6 - 29 U/L  Lipid panel  Result Value Ref Range   Cholesterol 172 <200 mg/dL   HDL 46 (L) > OR = 50 mg/dL   Triglycerides 96 <161 mg/dL   LDL Cholesterol (Calc) 107 (H) mg/dL (calc)   Total CHOL/HDL Ratio 3.7 <5.0 (calc)   Non-HDL Cholesterol (Calc) 126 <130 mg/dL (calc)  Hemoglobin W9U  Result Value Ref Range   Hgb A1c MFr Bld 5.3 <5.7 % of total Hgb   Mean Plasma Glucose 105 mg/dL   eAG (mmol/L) 5.8 mmol/L      Assessment & Plan:   Problem List Items Addressed This Visit   None Visit Diagnoses      Abscess    -  Primary   will treat prophylacticly with doxycyline.  referral placed to surgery for removal of cyst   Relevant Medications   doxycycline (VIBRA-TABS) 100 MG tablet   Other Relevant Orders   Ambulatory referral to General Surgery   Lipoma of torso       will treat prophylacticly with doxycyline. referral placed to surgery for removal of cyst   Relevant Medications   doxycycline (VIBRA-TABS) 100 MG tablet   Other Relevant Orders   Ambulatory referral to General Surgery   Fertility testing       requesting referral to fertility clinic   Relevant Orders   Ambulatory referral to Gynecology        Follow up plan: Return if symptoms worsen or fail to improve.

## 2023-09-27 ENCOUNTER — Encounter: Payer: Self-pay | Admitting: Internal Medicine

## 2023-10-04 ENCOUNTER — Encounter: Payer: Self-pay | Admitting: Surgery

## 2023-10-04 ENCOUNTER — Ambulatory Visit: Payer: Federal, State, Local not specified - PPO | Admitting: Surgery

## 2023-10-04 DIAGNOSIS — L723 Sebaceous cyst: Secondary | ICD-10-CM | POA: Diagnosis not present

## 2023-10-04 DIAGNOSIS — L089 Local infection of the skin and subcutaneous tissue, unspecified: Secondary | ICD-10-CM | POA: Diagnosis not present

## 2023-10-04 MED ORDER — DOXYCYCLINE HYCLATE 100 MG PO TABS
100.0000 mg | ORAL_TABLET | Freq: Two times a day (BID) | ORAL | 0 refills | Status: AC
Start: 2023-10-04 — End: 2023-10-11

## 2023-10-04 NOTE — Patient Instructions (Signed)
   Excision of Skin Cysts or Lesions Excision of a skin lesion refers to the removal of a section of skin by making small cuts (incisions) in the skin. This procedure may be done to remove a cancerous (malignant) or noncancerous (benign) growth on the skin. It is typically done to treat or prevent cancer or infection. It may also be done to improve cosmetic appearance. The procedure may be done to remove: Cancerous growths, such as basal cell carcinoma, squamous cell carcinoma, or melanoma. Noncancerous growths, such as a cyst or lipoma. Growths, such as moles or skin tags, which may be removed for cosmetic reasons.  Various excision or surgical techniques may be used depending on your condition, the location of the lesion, and your overall health. Tell a health care provider about: Any allergies you have. All medicines you are taking, including vitamins, herbs, eye drops, creams, and over-the-counter medicines. Any problems you or family members have had with anesthetic medicines. Any blood disorders you have. Any surgeries you have had. Any medical conditions you have. Whether you are pregnant or may be pregnant. What are the risks? Generally, this is a safe procedure. However, problems may occur, including: Bleeding. Infection. Scarring. Recurrence of the cyst, lipoma, or cancer. Changes in skin sensation or appearance, such as discoloration or swelling. Reaction to the anesthetics. Allergic reaction to surgical materials or ointments. Damage to nerves, blood vessels, muscles, or other structures. Continued pain.  What happens before the procedure? Ask your health care provider about: Changing or stopping your regular medicines. This is especially important if you are taking diabetes medicines or blood thinners. Taking medicines such as aspirin and ibuprofen. These medicines can thin your blood. Do not take these medicines before your procedure if your health care provider  instructs you not to. You may be asked to take certain medicines. You may be asked to stop smoking. You may have an exam or testing. What happens during the procedure? To reduce your risk of infection: Your health care team will wash or sanitize their hands. Your skin will be washed with soap. You will be given a medicine to numb the area (local anesthetic). One of the following excision techniques will be performed. At the end of any of these procedures, antibiotic ointment will be applied as needed. Each of the following techniques may vary among health care providers and hospitals. Complete Surgical Excision The area of skin that needs to be removed will be marked with a pen. Using a small scalpel or scissors, the surgeon will gently cut around and under the lesion until it is completely removed. The lesion will be placed in a fluid and sent to the lab for examination. If necessary, bleeding will be controlled with a device that delivers heat (electrocautery). The edges of the wound may be stitched (sutured) together, and a bandage (dressing) or surgical glue will be applied. This procedure may be performed to treat a cancerous growth or a noncancerous cyst or lesion.  What happens after the procedure? Return to your normal activities as told by your health care provider. Report any excessive bleeding, spreading redness, or increased pain.

## 2023-10-04 NOTE — Progress Notes (Signed)
10/04/2023  Reason for Visit: Left upper back abscess  Requesting Provider: Della Goo, FNP  History of Present Illness: Tara Shea is a 42 y.o. female senting for evaluation of a left upper back abscess.  The patient reports that she noticed a small bump on her left upper back under her bra line for the last 3 months or so.  However recently she thought this was becoming more tender, erythematous, and larger in size.  She saw [name] on 09/24/2023 and was started on a course of doxycycline.  She was also trying warm compresses but the area never drain.  Eventually with her antibiotics on board, the area started getting better and smaller and she is no longer having any pain.  However the area is still larger than it was originally and there is still some redness on the skin.  Patient denies any fevers, chills, chest pain, shortness of breath.  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  She is currently on Eliquis.  Past Medical History: Past Medical History:  Diagnosis Date   Asthma    Bone spur    Cholelithiasis 07/26/2019   Complication of anesthesia    pt had hypotensive episode and has had n & v   DVT (deep venous thrombosis) (HCC) 03/2019   x 1 and DEC 2011   Dysplastic nevus 01/21/2023   right super medial scalpula  mild atypia   Factor 5 Leiden mutation, heterozygous (HCC)    stopped Xarelto 08/23/19   Fibroid    History of kidney stones    IDA (iron deficiency anemia)    vitamin b12 and iron deficiency   Migraines    PCOS (polycystic ovarian syndrome)    PCOS (polycystic ovarian syndrome)      Past Surgical History: Past Surgical History:  Procedure Laterality Date   CHOLECYSTECTOMY N/A 07/28/2019   Procedure: LAPAROSCOPIC CHOLECYSTECTOMY;  Surgeon: Leafy Ro, MD;  Location: [facility] ORS;  Service: General;  Laterality: N/A;   COLONOSCOPY WITH PROPOFOL N/A 11/13/2022   Procedure: COLONOSCOPY WITH  PROPOFOL;  Surgeon: Jaynie Collins, DO;  Location: [facility] ENDOSCOPY;  Service: Gastroenterology;  Laterality: N/A;   DILATION AND CURETTAGE OF UTERUS     HYSTEROSCOPY WITH D & C N/A 08/31/2019   Procedure: DILATATION AND CURETTAGE /HYSTEROSCOPY/ SUBMUCOSAL MYOMECTOMY;  Surgeon: Conard Novak, MD;  Location: [facility] ORS;  Service: Gynecology;  Laterality: N/A;   HYSTEROSCOPY WITH D & C N/A 10/03/2019   Procedure: DILATATION AND CURETTAGE /HYSTEROSCOPY/ SUBMUCOSAL MYOMECTOMY;  Surgeon: Conard Novak, MD;  Location: [facility] ORS;  Service: Gynecology;  Laterality: N/A;   HYSTEROSCOPY WITH D & C N/A 10/26/2019   Procedure: DILATATION AND CURETTAGE /HYSTEROSCOPY, SUBMUCOSAL MYOMECTOMY;  Surgeon: Conard Novak, MD;  Location: [facility] ORS;  Service: Gynecology;  Laterality: N/A;   MYOMECTOMY     PULMONARY THROMBECTOMY N/A 01/21/2021   Procedure: PULMONARY THROMBECTOMY;  Surgeon: Renford Dills, MD;  Location: [facility] INVASIVE CV LAB;  Service: Cardiovascular;  Laterality: N/A;   TONSILLECTOMY     WISDOM TOOTH EXTRACTION     WRIST SURGERY     age 72 - rebroke it b/c it grew back wrong    Home Medications: Prior to Admission medications   Medication Sig Start Date End Date Taking? Authorizing Provider  albuterol (PROVENTIL) (2.5 MG/3ML) 0.083% nebulizer solution Take 3 mLs (2.5 mg total) by nebulization every 6 (six) hours as needed for  wheezing or shortness of breath. 07/18/21  Yes Rumball, Darl Householder, DO  albuterol (VENTOLIN HFA) 108 (90 Base) MCG/ACT inhaler INHALE 2 PUFFS BY MOUTH EVERY 4 HOURS AS NEEDED FOR WHEEZE OR FOR SHORTNESS OF BREATH 10/27/22  Yes Berniece Salines, FNP  apixaban (ELIQUIS) 5 MG TABS tablet Take 1 tablet (5 mg total) by mouth 2 (two) times daily. 07/23/23  Yes Berniece Salines, FNP  Cholecalciferol (VITAMIN D3 PO) Take by mouth.   Yes [provider]  ferrous sulfate 325 (65 FE) MG tablet Take 325 mg by mouth daily.   Yes [provider]  LYSINE PO Take  by mouth.   Yes [provider]  Multiple Vitamins-Minerals (MULTIVITAMIN WITH MINERALS) tablet Take 1 tablet by mouth daily.   Yes [provider]  ondansetron (ZOFRAN ODT) 4 MG disintegrating tablet Take 1 tablet (4 mg total) by mouth every 6 (six) hours as needed for nausea. 09/10/21  Yes Caro Laroche, DO  promethazine (PHENERGAN) 25 MG tablet Take 1 tablet (25 mg total) by mouth every 8 (eight) hours as needed for nausea or vomiting. 11/22/19  Yes Doren Custard, FNP  Spacer/Aero-Holding Chambers (AEROCHAMBER PLUS) inhaler Use as instructed 05/08/17  Yes Domenick Gong, MD  vitamin B-12 (CYANOCOBALAMIN) 100 MCG tablet Take 100 mcg by mouth daily.   Yes [provider]  vitamin C (ASCORBIC ACID) 500 MG tablet Take 500 mg by mouth daily.   Yes [provider]  doxycycline (VIBRA-TABS) 100 MG tablet Take 1 tablet (100 mg total) by mouth 2 (two) times daily for 7 days. 10/04/23 10/11/23  Henrene Dodge, MD    Allergies: Allergies  Allergen Reactions   Covid-19 (Mrna) Vaccine Itching and Other (See Comments)    Lip swelling and tingling, throat itching, itching and redness on face, scalp and chest.     Social History:  reports that she has never smoked. She has never been exposed to tobacco smoke. She has never used smokeless tobacco. She reports that she does not currently use alcohol. She reports that she does not use drugs.   Family History: Family History  Problem Relation Age of Onset   Heart murmur Mother    Thyroid disease Mother    Migraines Mother    Asthma Mother    Diabetes Mother        TYPE 2   Bipolar disorder Mother    Cervical cancer Mother 19   Colon polyps Mother        >10   Breast cancer Maternal Aunt        dx 53s   Colon cancer Maternal Aunt        dx 50s   Diabetes Maternal Aunt    Endometrial cancer Maternal Aunt        dx 60s-70s   Melanoma Maternal Uncle        mult; first dx 30s   Colon cancer Maternal  Grandmother        dx 76s   Hypertension Maternal Grandmother    Heart attack Maternal Grandmother    Lung cancer Maternal Grandfather     Review of Systems: Review of Systems  Constitutional:  Negative for chills and fever.  Respiratory:  Negative for shortness of breath.   Cardiovascular:  Negative for chest pain.  Gastrointestinal:  Negative for nausea and vomiting.  Skin:        Left upper back area of swelling, erythema, and tenderness    Physical Exam BP 95/64  Pulse 87   Temp 98.2 F (36.8 C)   Ht 5\' 8"  (1.727 m)   Wt 246 lb (111.6 kg)   LMP 09/14/2023 (Exact Date)   SpO2 98%   BMI 37.40 kg/m  CONSTITUTIONAL: No acute distress, well-nourished HEENT:  Normocephalic, atraumatic, extraocular motion intact. RESPIRATORY:  Normal respiratory effort without pathologic use of accessory muscles. CARDIOVASCULAR: Regular rhythm and rate MUSCULOSKELETAL:  Normal muscle strength and tone in all four extremities.  No peripheral edema or cyanosis. SKIN: The patient has a 2.5 x 1.5 area in the left upper back with some erythema and mild skin induration with some palpable firmness consistent with a prior infected sebaceous cyst.  There is currently no drainage and no tenderness to palpation.  NEUROLOGIC:  Motor and sensation is grossly normal.  Cranial nerves are grossly intact. PSYCH:  Alert and oriented to person, place and time. Affect is normal.  Laboratory Analysis: Labs from 04/28/2023: Sodium 141, potassium 4.2, chloride 106, CO2 24, BUN 16, creatinine 0.71.  LFTs within normal.  WBC 3.9, hemoglobin 12, hematocrit 37.1, platelets 235.  Imaging: No results found.  Assessment and Plan: This is a 42 y.o. female with a previous infected left upper back sebaceous cyst.  - Discussed with patient the findings on exam today.  She did get better with the round of doxycycline last week.  However there is still an area of erythema with some induration at the site of the infected cyst.   As such, we will treat her with another round of doxycycline.  Discussed with her that as everything heals and cools down, we should be able to excise the cyst to prevent further recurrences.  She is in agreement.  Reviewed with her the procedure at length including the planned incision, risks of bleeding, infection, injury to surrounding structures, that this would be an office procedure under local anesthesia, postoperative wound care, pain control, and she is willing to proceed. - We will schedule her for office procedure in about 3 weeks to allow for this area to heal better from the infection.  Since she is on Eliquis, I have asked her to stop her Eliquis with no doses the day before the procedure.  Patient understands this plan and all of her questions have been answered.  I spent 30 minutes dedicated to the care of this patient on the date of this encounter to include pre-visit review of records, face-to-face time with the patient discussing diagnosis and management, and any post-visit coordination of care.   Howie Ill, MD [facility]

## 2023-10-25 ENCOUNTER — Encounter: Payer: Self-pay | Admitting: Surgery

## 2023-10-25 ENCOUNTER — Ambulatory Visit (INDEPENDENT_AMBULATORY_CARE_PROVIDER_SITE_OTHER): Payer: Federal, State, Local not specified - PPO | Admitting: Surgery

## 2023-10-25 VITALS — BP 117/72 | HR 62 | Temp 98.3°F | Ht 68.0 in | Wt 241.6 lb

## 2023-10-25 DIAGNOSIS — L720 Epidermal cyst: Secondary | ICD-10-CM | POA: Diagnosis not present

## 2023-10-25 DIAGNOSIS — L723 Sebaceous cyst: Secondary | ICD-10-CM

## 2023-10-25 MED ORDER — OXYCODONE HCL 5 MG PO TABS: 5.0000 mg | ORAL_TABLET | Freq: Four times a day (QID) | ORAL | 0 refills | Status: DC | PRN

## 2023-10-25 NOTE — Patient Instructions (Signed)
 We have removed a Cyst in our office today.  You have sutures under the skin that will dissolve and also dermabond (skin glue) on top of your skin which will come off on it's own in 10-14 days.  You may use Ibuprofen or Tylenol as needed for pain control. Use the ice pack 3-4 times a day for the next two days for any achiness.  You may shower tomorrow, do not scrub at the area.  Avoid Strenuous activities that will make you sweat during the next 48 hours to avoid the glue coming off prematurely. Avoid activities that will place pressure to this area of the body for 1-2 weeks to avoid re-injury to incision site.  Please see your follow-up appointment provided. We will see you back in office to make sure this area is healed and to review the final pathology. If you have any questions or concerns prior to this appointment, call our office and speak with a nurse.    Excision of Skin Cysts or Lesions Excision of a skin lesion refers to the removal of a section of skin by making small cuts (incisions) in the skin. This procedure may be done to remove a cancerous (malignant) or noncancerous (benign) growth on the skin. It is typically done to treat or prevent cancer or infection. It may also be done to improve cosmetic appearance. The procedure may be done to remove: Cancerous growths, such as basal cell carcinoma, squamous cell carcinoma, or melanoma. Noncancerous growths, such as a cyst or lipoma. Growths, such as moles or skin tags, which may be removed for cosmetic reasons.  Various excision or surgical techniques may be used depending on your condition, the location of the lesion, and your overall health. Tell a health care provider about: Any allergies you have. All medicines you are taking, including vitamins, herbs, eye drops, creams, and over-the-counter medicines. Any problems you or family members have had with anesthetic medicines. Any blood disorders you have. Any surgeries you have  had. Any medical conditions you have. Whether you are pregnant or may be pregnant. What are the risks? Generally, this is a safe procedure. However, problems may occur, including: Bleeding. Infection. Scarring. Recurrence of the cyst, lipoma, or cancer. Changes in skin sensation or appearance, such as discoloration or swelling. Reaction to the anesthetics. Allergic reaction to surgical materials or ointments. Damage to nerves, blood vessels, muscles, or other structures. Continued pain.  What happens before the procedure? Ask your health care provider about: Changing or stopping your regular medicines. This is especially important if you are taking diabetes medicines or blood thinners. Taking medicines such as aspirin and ibuprofen. These medicines can thin your blood. Do not take these medicines before your procedure if your health care provider instructs you not to. You may be asked to take certain medicines. You may be asked to stop smoking. You may have an exam or testing. What happens during the procedure? To reduce your risk of infection: Your health care team will wash or sanitize their hands. Your skin will be washed with soap. You will be given a medicine to numb the area (local anesthetic). One of the following excision techniques will be performed. At the end of any of these procedures, antibiotic ointment will be applied as needed. Each of the following techniques may vary among health care providers and hospitals. Complete Surgical Excision The area of skin that needs to be removed will be marked with a pen. Using a small scalpel or scissors, the surgeon  will gently cut around and under the lesion until it is completely removed. The lesion will be placed in a fluid and sent to the lab for examination. If necessary, bleeding will be controlled with a device that delivers heat (electrocautery). The edges of the wound may be stitched (sutured) together, and a bandage  (dressing) or surgical glue will be applied. This procedure may be performed to treat a cancerous growth or a noncancerous cyst or lesion.  What happens after the procedure? Return to your normal activities as told by your health care provider. Report any excessive bleeding, spreading redness, or increased pain.

## 2023-10-25 NOTE — Progress Notes (Signed)
  Procedure Date:  10/25/2023  Pre-operative Diagnosis:  Left lateral back sebaceous cyst  Post-operative Diagnosis:  Left lateral back sebaceous cyst, 2 cm.  Procedure:  Excision of left lateral back sebaceous cyst, 3.5 cm layered closure.  Surgeon:  Howie Ill, MD  Anesthesia:  8 ml of 1% lidocaine with epi  Estimated Blood Loss:  10 ml  Specimens:  Sebaceous cyst  Complications:  None  Indications for Procedure:  This is a 42 y.o. female with diagnosis of an infected sebaceous cyst of the left upper/lateral back.  This was treated with antibiotics and now the patient wishes to have this excised. The risks of bleeding, abscess or infection, injury to surrounding structures, and need for further procedures were all discussed with the patient and she was willing to proceed.  Description of Procedure: The patient was correctly identified at bedside.  The patient was placed supine.  Appropriate time-outs were performed.  The patient's left lateral back was prepped and draped in usual sterile fashion.  Local anesthetic was infused intradermally.  A 3.5 cm elliptical incision was made over the cyst, incorporating possible entry pores, and scalpel was used to dissect down the skin and subcutaneous tissue.  Skin flaps were created sharply, and then the cyst was excised intact.  It was sent off to pathology.  The cavity was then irrigated and hemostasis was assured with two 3-0 Vicryl sutures.  The wound was then closed in two layers using 3-0 Vicryl and 4-0 Monocryl.  The incision was cleaned and sealed with DermaBond.  The patient tolerated the procedure well and all sharps were appropriately disposed of at the end of the case.  --Patient may shower tomorrow --Patient may take tylenol and/or oxycodone for pain control.  Will send Rx. --May resume home Eliquis tomorrow night. --Follow up in 10 days.  Howie Ill, MD

## 2023-10-27 LAB — SURGICAL PATHOLOGY

## 2023-11-03 ENCOUNTER — Encounter: Payer: Self-pay | Admitting: Surgery

## 2023-11-03 ENCOUNTER — Ambulatory Visit: Payer: Federal, State, Local not specified - PPO | Admitting: Surgery

## 2023-11-03 VITALS — BP 106/65 | HR 80 | Temp 98.2°F | Ht 68.0 in | Wt 235.0 lb

## 2023-11-03 DIAGNOSIS — Z09 Encounter for follow-up examination after completed treatment for conditions other than malignant neoplasm: Secondary | ICD-10-CM

## 2023-11-03 DIAGNOSIS — L723 Sebaceous cyst: Secondary | ICD-10-CM

## 2023-11-03 NOTE — Progress Notes (Signed)
11/03/2023  HPI: Tara Shea is a 42 y.o. female s/p excision of left back sebaceous cyst on 10/25/23.  Patient presents for follow up.  Has been doing well.  Denies any worsening pain, drainage, or opening of the wound.  Reports some itching.  Vital signs: BP 106/65   Pulse 80   Temp 98.2 F (36.8 C)   Ht 5\' 8"  (1.727 m)   Wt 235 lb (106.6 kg)   LMP 10/19/2023 (Exact Date)   SpO2 98%   BMI 35.73 kg/m    Physical Exam: Constitutional:  No acute distress Skin:  Left back incision is healing well and is clean, dry, intact. Some Dermabond still in place.  No evidence of infection.  Assessment/Plan: This is a 43 y.o. female s/p excision of left back sebaceous cyst.  --Reviewed pathology results with patient.  This showed an epidermal inclusion cyst.  Discussed that this is a benign mass. --May start applying vitamin E lotion or cocoa butter lotion to help moisturize the wound and potentially fade the scar. --Follow up as needed.   Howie Ill, MD [facility]

## 2023-11-03 NOTE — Patient Instructions (Addendum)
Follow-up with our office as needed.  Please call and ask to speak with a nurse if you develop questions or concerns.  Sutures are stitches that can be used to close wounds. Some stitches break down as they heal (absorbable). Other stitches need to be taken out by your doctor (nonabsorbable). Taking good care of your wound can help to prevent pain and infection. It can also help your wound heal more quickly. Follow instructions from your doctor about how to care for your sutured wound. Supplies needed: Soap and water. A clean, dry towel. Solution to clean your wound, if needed. A clean gauze or bandage (dressing), if needed. Antibiotic ointment, if told by your doctor. How to care for your sutured wound  Keep the wound fully dry for the first 24 hours or as long as told by your doctor. After 24-48 hours, you may shower or bathe as told by your doctor. Do not soak the wound or put the wound under water until the stitches have been taken out. After the first 24 hours, clean the wound once a day, or as often as your doctor tells you to. Take these steps: Wash and rinse the wound as told by your health care provider. Pat the wound dry with a clean towel. Do not rub the wound. After cleaning the wound, put a thin layer of antibiotic ointment on the wound as told by your doctor. This will help: Prevent infection. Keep the bandage from sticking to the wound. Follow instructions from your doctor about how to change your bandage. Make sure you: Wash your hands with soap and water for at least 20 seconds. If you cannot use soap and water, use hand sanitizer. Change your bandage at least once a day, or as often as told by your doctor. If your dressing gets wet or dirty, change it. Leavestitches in place for at least 2 weeks. If you have skin glue over your stitches, this should also stay in place for at least 2 weeks. Leave tape strips alone (if you have them) unless you are told to take them off. You  may trim the edges of the tape strips if they curl up. Check your wound every day for signs of infection. Watch for: Redness, swelling, or pain. Fluid or blood. New warmth, a rash, or hardness at the wound site. Pus or a bad smell. Have the stitches taken out as told by your doctor. Follow these instructions at home: Medicines Take or apply over-the-counter and prescription medicines only as told by your doctor. If you were prescribed an antibiotic medicine or ointment, take or apply it as told by your doctor. Do not stop using the antibiotic even if you start to feel better. General instructions Cover your wound with clothes or put sunscreen on when you are outside. Use a sunscreen of at least 30 SPF. Do not scratch or pick at your wound. Avoid stretching your wound. Raise the injured area above the level of your heart while you are sitting or lying down, if possible. Eat a diet that includes protein, vitamin A, and vitamin C. Doing this will help your wound heal. Drink enough fluid to keep your pee (urine) pale yellow. Keep all follow-up visits. Contact a doctor if: You were given a tetanus shot and you have any of the following at the site where the needle went in: Swelling. Very bad pain. Redness. Bleeding. Your wound breaks open. You see something coming out of your wound, such as wood or glass.  You have any of these signs of infection in or around your wound: Redness, swelling, or pain. Fluid or blood. Warmth. A new rash. Your wound feels hard. You have a fever. The skin near your wound changes color. You have pain that does not get better with medicine. You get numbness around the wound. Get help right away if: You have very bad swelling or more pain around your wound. You have pus or a bad smell coming from your wound. You have painful lumps near your wound or anywhere on your body. You have a red streak going away from your wound. The wound is on your hand or foot,  and: Your fingers or toes look pale or blue. You cannot move a finger or toe as you used to do. You have numbness that spreads down your hand, foot, fingers, or toes. Summary Sutures are stitches that are used to close wounds. Taking good care of your wound can help to prevent pain and infection. Keep the wound fully dry for the first 24 hours or for as long as told by your doctor. After 24-48 hours, you may shower or bathe as told by your doctor. This information is not intended to replace advice given to you by your health care provider. Make sure you discuss any questions you have with your health care provider. Document Revised: 03/17/2021 Document Reviewed: 03/17/2021 Elsevier Patient Education  2024 [employer].

## 2023-11-10 DIAGNOSIS — D251 Intramural leiomyoma of uterus: Secondary | ICD-10-CM | POA: Diagnosis not present

## 2023-11-10 DIAGNOSIS — E2839 Other primary ovarian failure: Secondary | ICD-10-CM | POA: Diagnosis not present

## 2023-11-11 DIAGNOSIS — E288 Other ovarian dysfunction: Secondary | ICD-10-CM | POA: Diagnosis not present

## 2024-02-02 ENCOUNTER — Ambulatory Visit: Payer: Federal, State, Local not specified - PPO | Admitting: Dermatology

## 2024-02-22 ENCOUNTER — Encounter: Payer: Self-pay | Admitting: Dermatology

## 2024-02-22 ENCOUNTER — Ambulatory Visit: Admitting: Dermatology

## 2024-02-22 DIAGNOSIS — W908XXA Exposure to other nonionizing radiation, initial encounter: Secondary | ICD-10-CM | POA: Diagnosis not present

## 2024-02-22 DIAGNOSIS — Z808 Family history of malignant neoplasm of other organs or systems: Secondary | ICD-10-CM

## 2024-02-22 DIAGNOSIS — L821 Other seborrheic keratosis: Secondary | ICD-10-CM

## 2024-02-22 DIAGNOSIS — Z86018 Personal history of other benign neoplasm: Secondary | ICD-10-CM

## 2024-02-22 DIAGNOSIS — D492 Neoplasm of unspecified behavior of bone, soft tissue, and skin: Secondary | ICD-10-CM | POA: Diagnosis not present

## 2024-02-22 DIAGNOSIS — D2261 Melanocytic nevi of right upper limb, including shoulder: Secondary | ICD-10-CM

## 2024-02-22 DIAGNOSIS — D2262 Melanocytic nevi of left upper limb, including shoulder: Secondary | ICD-10-CM

## 2024-02-22 DIAGNOSIS — D2272 Melanocytic nevi of left lower limb, including hip: Secondary | ICD-10-CM

## 2024-02-22 DIAGNOSIS — D229 Melanocytic nevi, unspecified: Secondary | ICD-10-CM

## 2024-02-22 DIAGNOSIS — L814 Other melanin hyperpigmentation: Secondary | ICD-10-CM

## 2024-02-22 DIAGNOSIS — Z1283 Encounter for screening for malignant neoplasm of skin: Secondary | ICD-10-CM

## 2024-02-22 DIAGNOSIS — L578 Other skin changes due to chronic exposure to nonionizing radiation: Secondary | ICD-10-CM

## 2024-02-22 NOTE — Patient Instructions (Addendum)

## 2024-02-22 NOTE — Progress Notes (Signed)
 Follow-Up Visit   Subjective  Tara Shea is a 43 y.o. female who presents for the following: Skin Cancer Screening and Full Body Skin Exam hx of Dysplastic nevus, fhx of melanoma, uncle  The patient presents for Total-Body Skin Exam (TBSE) for skin cancer screening and mole check. The patient has spots, moles and lesions to be evaluated, some may be new or changing and the patient may have concern these could be cancer.  The following portions of the chart were reviewed this encounter and updated as appropriate: medications, allergies, medical history  Review of Systems:  No other skin or systemic complaints except as noted in HPI or Assessment and Plan.  Objective  Well appearing patient in no apparent distress; mood and affect are within normal limits.  A full examination was performed including scalp, head, eyes, ears, nose, lips, neck, chest, axillae, abdomen, back, buttocks, bilateral upper extremities, bilateral lower extremities, hands, feet, fingers, toes, fingernails, and toenails. All findings within normal limits unless otherwise noted below.   Relevant physical exam findings are noted in the Assessment and Plan.  Left Popliteal Fossa 0.7 x 0.4cm two tone brown macule        Assessment & Plan   SKIN CANCER SCREENING PERFORMED TODAY.  ACTINIC DAMAGE - Chronic condition, secondary to cumulative UV/sun exposure - diffuse scaly erythematous macules with underlying dyspigmentation - Recommend daily broad spectrum sunscreen SPF 30+ to sun-exposed areas, reapply every 2 hours as needed.  - Staying in the shade or wearing long sleeves, sun glasses (UVA+UVB protection) and wide brim hats (4-inch brim around the entire circumference of the hat) are also recommended for sun protection.  - Call for new or changing lesions.  LENTIGINES, SEBORRHEIC KERATOSES, HEMANGIOMAS - Benign normal skin lesions - Benign-appearing - Call for any changes  MELANOCYTIC NEVI -  Tan-brown and/or pink-flesh-colored symmetric macules and papules - Benign appearing on exam today - Observation - Call clinic for new or changing moles - Recommend daily use of broad spectrum spf 30+ sunscreen to sun-exposed areas.  -R deltoid 0.3cm  darker brown regular macule -L deltoid 0.3cm darker brown regular macule  FAMILY HISTORY OF SKIN CANCER What type(s):Melanoma Who affected:uncle   HISTORY OF DYSPLASTIC NEVUS No evidence of recurrence today Recommend regular full body skin exams Recommend daily broad spectrum sunscreen SPF 30+ to sun-exposed areas, reapply every 2 hours as needed.  Call if any new or changing lesions are noted between office visits  - R sup medial scapula   NEOPLASM OF SKIN Left Popliteal Fossa Epidermal / dermal shaving  Lesion diameter (cm):  0.7 Informed consent: discussed and consent obtained   Timeout: patient name, date of birth, surgical site, and procedure verified   Procedure prep:  Patient was prepped and draped in usual sterile fashion Prep type:  Isopropyl alcohol Anesthesia: the lesion was anesthetized in a standard fashion   Anesthetic:  1% lidocaine w/ epinephrine 1-100,000 buffered w/ 8.4% NaHCO3 Instrument used: flexible razor blade   Hemostasis achieved with: pressure, aluminum chloride and electrodesiccation   Outcome: patient tolerated procedure well   Post-procedure details: sterile dressing applied and wound care instructions given   Dressing type: bandage and bacitracin   Specimen 1 - Surgical pathology Differential Diagnosis: D48.5 Nevus vs Dysplastic nevus  Check Margins: yes 0.7 x 0.4cm two tone brown macule Return in about 1 year (around 02/21/2025) for TBSE, Hx of Dysplastic nevi.  I, Ardis Rowan, RMA, am acting as scribe for Armida Sans, MD .  Documentation: I have reviewed the above documentation for accuracy and completeness, and I agree with the above.  Armida Sans, MD

## 2024-02-24 LAB — SURGICAL PATHOLOGY

## 2024-02-28 ENCOUNTER — Telehealth: Payer: Self-pay

## 2024-02-28 NOTE — Telephone Encounter (Addendum)
 Called and discussed results. Patient denied further questions. Will recheck at next follow up----- Message from Willeen Niece sent at 02/28/2024  1:09 PM EDT ----- 1. Skin, left popliteal fossa :       DYSPLASTIC COMPOUND NEVUS WITH MODERATE ATYPIA, PERIPHERAL MARGIN INVOLVED   Moderately atypical mole, recommend observation - please call patient

## 2024-05-16 DIAGNOSIS — S61011A Laceration without foreign body of right thumb without damage to nail, initial encounter: Secondary | ICD-10-CM | POA: Diagnosis not present

## 2024-06-09 ENCOUNTER — Encounter: Payer: Self-pay | Admitting: Nurse Practitioner

## 2024-06-09 ENCOUNTER — Other Ambulatory Visit (HOSPITAL_COMMUNITY)
Admission: RE | Admit: 2024-06-09 | Discharge: 2024-06-09 | Disposition: A | Discharge status: Home / Self Care | Source: Ambulatory Visit | Attending: Nurse Practitioner | Admitting: Nurse Practitioner

## 2024-06-09 ENCOUNTER — Ambulatory Visit (INDEPENDENT_AMBULATORY_CARE_PROVIDER_SITE_OTHER): Admitting: Nurse Practitioner

## 2024-06-09 VITALS — BP 126/84 | HR 101 | Temp 98.9°F | Resp 14 | Wt 190.6 lb

## 2024-06-09 DIAGNOSIS — Z131 Encounter for screening for diabetes mellitus: Secondary | ICD-10-CM | POA: Diagnosis not present

## 2024-06-09 DIAGNOSIS — Z13220 Encounter for screening for lipoid disorders: Secondary | ICD-10-CM

## 2024-06-09 DIAGNOSIS — Z0000 Encounter for general adult medical examination without abnormal findings: Secondary | ICD-10-CM | POA: Insufficient documentation

## 2024-06-09 DIAGNOSIS — Z1231 Encounter for screening mammogram for malignant neoplasm of breast: Secondary | ICD-10-CM

## 2024-06-09 DIAGNOSIS — Z130 Encounter for screening for diseases of the blood and blood-forming organs and certain disorders involving the immune mechanism: Secondary | ICD-10-CM

## 2024-06-09 DIAGNOSIS — Z124 Encounter for screening for malignant neoplasm of cervix: Secondary | ICD-10-CM | POA: Insufficient documentation

## 2024-06-09 NOTE — Progress Notes (Signed)
 Name: Tara Shea   MRN: [id number]    DOB: September 04, 1981   Date:06/09/2024       Progress Note  Subjective  Chief Complaint  Chief Complaint  Patient presents with   Annual Exam    pap    HPI  Patient presents for annual CPE.  Discussed the use of AI scribe software for clinical note transcription with the patient, who gave verbal consent to proceed.  History of Present Illness Tara Shea is a 43 year old female who presents for a routine check-up and management of irregular menstrual periods.  Abnormal uterine bleeding and pelvic pain - Irregular menstrual periods with spotting between periods - Cramping typically occurs about a week after her period - Cramping is uncomfortable, especially when walking - Known uterine fibroids based on previous fertility evaluation  Weight loss and related concerns - Significant weight loss of 152 pounds since February of the previous year using the [place] app - Improved overall well-being and reduced knee pain - Enhanced endurance; no longer gets winded while walking dogs - Concern about excess skin, particularly on her arms, which is bothersome  Physical activity and sleep - Engages in regular physical activity: walks at least three times a week at work and attends the gym on weekends - Diet is well-balanced, contributing to weight loss - Averages six hours of sleep per night - Difficulty going to bed before 10 or 11 PM; wakes up at 5 AM  Urinary symptoms - Small amount of urinary leakage for approximately one year - Wears a liner for urinary leakage - No issues with incontinence    Diet: well balanced diet,  has been working on weight loss Exercise: gym, walking 3 times a week  Sleep: 6 hours Last dental exam:2025 spring Last eye exam: 10/2023 Waist Measurement : (not recorded)   Flowsheet Row Office Visit from 06/09/2024 in [facility]  AUDIT-C Score 0   Depression: Phq 9 is   negative    06/09/2024    3:05 PM 09/24/2023    2:38 PM 04/28/2023    8:57 AM 04/13/2022   11:49 AM 09/23/2021    8:14 AM  Depression screen PHQ 2/9  Decreased Interest 0 0 0 0 0  Down, Depressed, Hopeless 0 0 0 0 0  PHQ - 2 Score 0 0 0 0 0  Altered sleeping 0    0  Tired, decreased energy 0    0  Change in appetite 0    0  Feeling bad or failure about yourself  0    0  Trouble concentrating 0    0  Moving slowly or fidgety/restless 0    0  Suicidal thoughts 0    0  PHQ-9 Score 0    0  Difficult doing work/chores Not difficult at all    Not difficult at all   Hypertension: BP Readings from Last 3 Encounters:  06/09/24 126/84  11/03/23 106/65  10/25/23 117/72   Obesity: Wt Readings from Last 3 Encounters:  06/09/24 190 lb 9.6 oz (86.5 kg)  11/03/23 235 lb (106.6 kg)  10/25/23 241 lb 9.6 oz (109.6 kg)   BMI Readings from Last 3 Encounters:  06/09/24 28.98 kg/m  11/03/23 35.73 kg/m  10/25/23 36.74 kg/m     Vaccines:  HPV: up to at age 75 , ask insurance if age between 1-45  Shingrix: 23-64 yo and ask insurance if covered when patient above 85 yo Pneumonia:  educated  and discussed with patient. Flu:  educated and discussed with patient.  Hep C Screening: completed STD testing and prevention (HIV/chl/gon/syphilis): completed Intimate partner violence:none Sexual History : yes Menstrual History/LMP/Abnormal Bleeding: [facility]: irregular  Incontinence Symptoms: none  Breast cancer:  - Last Mammogram: due, 06/10/2023 - BRCA gene screening: aunt with breast ca  Osteoporosis: Discussed high calcium and vitamin D supplementation, weight bearing exercises  Cervical cancer screening: 06/29/2018, due  Skin cancer: Discussed monitoring for atypical lesions  Colorectal cancer: does not qualify   Lung cancer:   Low Dose CT Chest recommended if Age 39-80 years, 20 pack-year currently smoking OR have quit w/in 15years. Patient does not qualify.   ECG: 01/22/2021  Advanced Care  Planning: A voluntary discussion about advance care planning including the explanation and discussion of advance directives.  Discussed health care proxy and Living will, and the patient was able to identify a health care proxy as husband.  Patient does not have a living will at present time. If patient does have living will, I have requested they bring this to the clinic to be scanned in to their chart.  Lipids: Lab Results  Component Value Date   CHOL 172 04/28/2023   CHOL 194 04/13/2022   CHOL 184 08/02/2018   Lab Results  Component Value Date   HDL 46 (L) 04/28/2023   HDL 71 04/13/2022   HDL 64 08/02/2018   Lab Results  Component Value Date   LDLCALC 107 (H) 04/28/2023   LDLCALC 105 (H) 04/13/2022   LDLCALC 103 (H) 08/02/2018   Lab Results  Component Value Date   TRIG 96 04/28/2023   TRIG 88 04/13/2022   TRIG 76 08/02/2018   Lab Results  Component Value Date   CHOLHDL 3.7 04/28/2023   CHOLHDL 2.7 04/13/2022   CHOLHDL 2.9 08/02/2018   No results found for: LDLDIRECT  Glucose: Glucose  Date Value Ref Range Status  09/14/2013 106 (H) 65 - 99 mg/dL Final   Glucose, Bld  Date Value Ref Range Status  04/28/2023 109 (H) 65 - 99 mg/dL Final    Comment:    .            Fasting reference interval . For someone without known diabetes, a glucose value between 100 and 125 mg/dL is consistent with prediabetes and should be confirmed with a follow-up test. .   02/05/2023 124 (H) 70 - 99 mg/dL Final    Comment:    Glucose reference range applies only to samples taken after fasting for at least 8 hours.  04/13/2022 99 65 - 99 mg/dL Final    Comment:    .            Fasting reference interval .    Glucose-Capillary  Date Value Ref Range Status  01/21/2021 87 70 - 99 mg/dL Final    Comment:    Glucose reference range applies only to samples taken after fasting for at least 8 hours.    Patient Active Problem List   Diagnosis Date Noted   Genetic testing  02/17/2023   Arthralgia of knee, left 09/23/2021   Popliteal cyst, left 09/23/2021   Adverse reaction to COVID-19 vaccine 01/28/2021   Pulmonary embolism (HCC) 01/21/2021   Shortness of breath 01/20/2021   Acute pulmonary embolus (HCC) 01/20/2021   DVT (deep venous thrombosis) (HCC) 01/20/2021   Acute pulmonary embolism (HCC) 01/20/2021   Infertility, female 11/26/2020   PCOS (polycystic ovarian syndrome) 11/26/2020   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/25/2020  Intramural and submucous leiomyoma of uterus 08/31/2019   Menorrhagia with irregular cycle 08/31/2019   Dysmenorrhea 08/31/2019   B12 deficiency 08/09/2019   Iron  deficiency anemia due to chronic blood loss 06/22/2019   Acute deep vein thrombosis (DVT) of calf muscle vein of right lower extremity (HCC) 04/18/2019   Factor V Leiden mutation (HCC)    Allergic rhinitis 03/01/2019   Kidney stone 08/02/2018   Asthma, mild intermittent 08/02/2018   Migraine headache with aura 08/02/2018   Morbid obesity (HCC) 08/02/2018    Past Surgical History:  Procedure Laterality Date   CHOLECYSTECTOMY N/A 07/28/2019   Procedure: LAPAROSCOPIC CHOLECYSTECTOMY;  Surgeon: Jordis Laneta FALCON, MD;  Location: [facility];  Service: General;  Laterality: N/A;   COLONOSCOPY WITH PROPOFOL  N/A 11/13/2022   Procedure: COLONOSCOPY WITH PROPOFOL ;  Surgeon: Onita Elspeth Sharper, DO;  Location: [facility];  Service: Gastroenterology;  Laterality: N/A;   DILATION AND CURETTAGE OF UTERUS     HYSTEROSCOPY WITH D & C N/A 08/31/2019   Procedure: DILATATION AND CURETTAGE /HYSTEROSCOPY/ SUBMUCOSAL MYOMECTOMY;  Surgeon: Leonce Garnette BIRCH, MD;  Location: [facility];  Service: Gynecology;  Laterality: N/A;   HYSTEROSCOPY WITH D & C N/A 10/03/2019   Procedure: DILATATION AND CURETTAGE /HYSTEROSCOPY/ SUBMUCOSAL MYOMECTOMY;  Surgeon: Leonce Garnette BIRCH, MD;  Location: [facility];  Service: Gynecology;  Laterality: N/A;   HYSTEROSCOPY WITH D & C N/A 10/26/2019   Procedure:  DILATATION AND CURETTAGE /HYSTEROSCOPY, SUBMUCOSAL MYOMECTOMY;  Surgeon: Leonce Garnette BIRCH, MD;  Location: [facility];  Service: Gynecology;  Laterality: N/A;   MYOMECTOMY     PULMONARY THROMBECTOMY N/A 01/21/2021   Procedure: PULMONARY THROMBECTOMY;  Surgeon: Jama Cordella MATSU, MD;  Location: [facility];  Service: Cardiovascular;  Laterality: N/A;   TONSILLECTOMY     WISDOM TOOTH EXTRACTION     WRIST SURGERY     age 75 - rebroke it b/c it grew back wrong    Family History  Problem Relation Age of Onset   Heart murmur Mother    Thyroid  disease Mother    Migraines Mother    Asthma Mother    Diabetes Mother        TYPE 2   Bipolar disorder Mother    Cervical cancer Mother 7   Colon polyps Mother        >10   Breast cancer Maternal Aunt        dx 24s   Colon cancer Maternal Aunt        dx 34s   Diabetes Maternal Aunt    Endometrial cancer Maternal Aunt        dx 60s-70s   Melanoma Maternal Uncle        mult; first dx 30s   Colon cancer Maternal Grandmother        dx 90s   Hypertension Maternal Grandmother    Heart attack Maternal Grandmother    Lung cancer Maternal Grandfather     Social History   Socioeconomic History   Marital status: Married    Spouse name: Tahlia Deamer   Number of children: 0   Years of education: 16   Highest education level: Bachelor's degree (e.g., BA, AB, BS)  Occupational History   Occupation: Teacher, adult education: [employer]    Comment: ED    (works at [employer])  Tobacco Use   Smoking status: Never    Passive exposure: Never   Smokeless tobacco: Never  Vaping Use   Vaping status: Never Used  Substance  and Sexual Activity   Alcohol use: Not Currently   Drug use: Never   Sexual activity: Yes    Partners: Male  Other Topics Concern   Not on file  Social History Narrative    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  No children.       04/28/2023   Works at [employer] in [employer]       Social Drivers of [employer]: Low Risk  (06/09/2024)   Overall Financial Resource Strain (CARDIA)    Difficulty of Paying Living Expenses: Not hard at all  Food Insecurity: No Food Insecurity (06/09/2024)   Hunger Vital Sign    Worried About Running Out of Food in the Last Year: Never true    Ran Out of Food in the Last Year: Never true  Transportation Needs: No Transportation Needs (09/24/2023)   PRAPARE - Administrator, Civil Service (Medical): No    Lack of Transportation (Non-Medical): No  Physical Activity: Sufficiently Active (09/24/2023)   Exercise Vital Sign    Days of Exercise per Week: 3 days    Minutes of Exercise per Session: 60 min  Stress: No Stress Concern Present (06/09/2024)   [employer] of Occupational Health - Occupational Stress Questionnaire    Feeling of Stress: Not at all  Social Connections: Moderately Integrated (09/24/2023)   Social Connection and Isolation Panel    Frequency of Communication with Friends and Family: More than three times a week    Frequency of Social Gatherings with Friends and Family: Once a week    Attends Religious Services: 1 to 4 times per year    Active Member of [employer] or Organizations: No    Attends Banker Meetings: Not on file    Marital Status: Married  Catering manager Violence: Not At Risk (06/09/2024)   Humiliation, Afraid, Rape, and Kick questionnaire    Fear of Current or Ex-Partner: No    Emotionally Abused: No    Physically Abused: No    Sexually Abused: No     Current Outpatient Medications:    albuterol  (PROVENTIL ) (2.5 MG/3ML) 0.083% nebulizer solution, Take 3 mLs (2.5 mg total) by nebulization every 6 (six) hours as needed for wheezing or shortness of breath., Disp: 75 mL, Rfl: 6   albuterol  (VENTOLIN  HFA) 108 (90 Base) MCG/ACT inhaler, INHALE 2 PUFFS BY MOUTH EVERY 4 HOURS AS NEEDED FOR WHEEZE OR FOR SHORTNESS OF BREATH, Disp: 8.5 each, Rfl: 2   apixaban  (ELIQUIS ) 5 MG TABS tablet, Take 1 tablet (5 mg  total) by mouth 2 (two) times daily., Disp: 180 tablet, Rfl: 3   Cholecalciferol (VITAMIN D3 PO), Take by mouth., Disp: , Rfl:    ferrous sulfate  325 (65 FE) MG tablet, Take 325 mg by mouth daily., Disp: , Rfl:    LYSINE PO, Take by mouth., Disp: , Rfl:    Multiple Vitamins-Minerals (MULTIVITAMIN WITH MINERALS) tablet, Take 1 tablet by mouth daily., Disp: , Rfl:    ondansetron  (ZOFRAN  ODT) 4 MG disintegrating tablet, Take 1 tablet (4 mg total) by mouth every 6 (six) hours as needed for nausea., Disp: 60 tablet, Rfl: 1   promethazine  (PHENERGAN ) 25 MG tablet, Take 1 tablet (25 mg total) by mouth every 8 (eight) hours as needed for nausea or vomiting., Disp: 60 tablet, Rfl: 1   Spacer/Aero-Holding Chambers (AEROCHAMBER PLUS) inhaler, Use as instructed, Disp: 1 each, Rfl: 2   vitamin B-12 (CYANOCOBALAMIN ) 100 MCG tablet, Take  100 mcg by mouth daily., Disp: , Rfl:    vitamin C (ASCORBIC ACID ) 500 MG tablet, Take 500 mg by mouth daily., Disp: , Rfl:   Allergies  Allergen Reactions   Covid-19 (Mrna) Vaccine Itching and Other (See Comments)    Lip swelling and tingling, throat itching, itching and redness on face, scalp and chest.      ROS  Constitutional: Negative for fever or weight change.  Respiratory: Negative for cough and shortness of breath.   Cardiovascular: Negative for chest pain or palpitations.  Gastrointestinal: Negative for abdominal pain, no bowel changes.  Musculoskeletal: Negative for gait problem or joint swelling.  Skin: Negative for rash.  Neurological: Negative for dizziness or headache.  No other specific complaints in a complete review of systems (except as listed in HPI above).   Objective  Vitals:   06/09/24 1514  BP: 126/84  Pulse: (!) 101  Resp: 14  Temp: 98.9 F (37.2 C)  SpO2: 100%  Weight: 190 lb 9.6 oz (86.5 kg)    Body mass index is 28.98 kg/m.  Physical Exam Vitals reviewed. Exam conducted with a chaperone present.  Constitutional:       Appearance: Normal appearance.  HENT:     Head: Normocephalic.     Right Ear: Tympanic membrane normal.     Left Ear: Tympanic membrane normal.     Nose: Nose normal.  Eyes:     Extraocular Movements: Extraocular movements intact.     Conjunctiva/sclera: Conjunctivae normal.     Pupils: Pupils are equal, round, and reactive to light.  Neck:     Thyroid : No thyroid  mass, thyromegaly or thyroid  tenderness.  Cardiovascular:     Rate and Rhythm: Normal rate and regular rhythm.     Pulses: Normal pulses.     Heart sounds: Normal heart sounds.  Pulmonary:     Effort: Pulmonary effort is normal.     Breath sounds: Normal breath sounds.  Chest:  Breasts:    Right: Normal.     Left: Normal.  Abdominal:     General: Bowel sounds are normal.     Palpations: Abdomen is soft.  Genitourinary:    Vagina: Normal.     Cervix: Normal.     Uterus: Normal.      Adnexa: Right adnexa normal and left adnexa normal.  Musculoskeletal:        General: Normal range of motion.     Cervical back: Normal range of motion and neck supple.     Right lower leg: No edema.     Left lower leg: No edema.  Skin:    General: Skin is warm and dry.     Capillary Refill: Capillary refill takes less than 2 seconds.  Neurological:     General: No focal deficit present.     Mental Status: She is alert and oriented to person, place, and time. Mental status is at baseline.  Psychiatric:        Mood and Affect: Mood normal.        Behavior: Behavior normal.        Thought Content: Thought content normal.        Judgment: Judgment normal.      No results found for this or any previous visit (from the past 2160 hours).   Fall Risk:    06/09/2024    3:05 PM 10/04/2023   10:21 AM 09/24/2023    2:38 PM 04/28/2023    8:57 AM 04/13/2022   11:48  AM  Fall Risk   Falls in the past year? 0 0 0 0 0  Number falls in past yr: 0 0 0 0 0  Injury with Fall? 0 0 0 0 0  Risk for fall due to :   No Fall Risks    Follow up  Falls evaluation completed  Falls prevention discussed  Falls evaluation completed      Data saved with a previous flowsheet row definition     Functional Status Survey: Is the patient deaf or have difficulty hearing?: No Does the patient have difficulty seeing, even when wearing glasses/contacts?: No Does the patient have difficulty concentrating, remembering, or making decisions?: No Does the patient have difficulty walking or climbing stairs?: No Does the patient have difficulty dressing or bathing?: No Does the patient have difficulty doing errands alone such as visiting a doctor's office or shopping?: No   Assessment & Plan  Problem List Items Addressed This Visit   None Visit Diagnoses       Annual physical exam    -  Primary   Relevant Orders   CBC with Differential/Platelet   Comprehensive metabolic panel with GFR   Lipid panel   Hemoglobin A1c   MM 3D SCREENING MAMMOGRAM BILATERAL BREAST   Cytology - PAP     Screening for deficiency anemia       Relevant Orders   CBC with Differential/Platelet     Screening for cholesterol level       Relevant Orders   Lipid panel     Screening for diabetes mellitus       Relevant Orders   Comprehensive metabolic panel with GFR   Hemoglobin A1c     Encounter for screening mammogram for malignant neoplasm of breast       Relevant Orders   MM 3D SCREENING MAMMOGRAM BILATERAL BREAST     Screening for cervical cancer       Relevant Orders   Cytology - PAP      Assessment and Plan Assessment & Plan Annual exam Routine annual examination with normal blood pressure. Menstrual irregularities with spotting and cramping likely due to fibroids. Minor urinary incontinence requiring a liner. Recent dental exam in spring and eye exam in December. - Perform Pap smear - Order mammogram -getting routine labs  Obesity, status post significant weight loss Significant weight loss of 152 pounds since February using the [place] app. Improved  physical condition with reduced knee pain and no dyspnea on exertion. Regular exercise includes walking three times a week and gym visits on weekends. Sleep duration is approximately six hours per night. - Maintain regular exercise routine    -USPSTF grade A and B recommendations reviewed with patient; age-appropriate recommendations, preventive care, screening tests, etc discussed and encouraged; healthy living encouraged; see AVS for patient education given to patient -Discussed importance of 150 minutes of physical activity weekly, eat two servings of fish weekly, eat one serving of tree nuts ( cashews, pistachios, pecans, almonds.SABRA) every other day, eat 6 servings of fruit/vegetables daily and drink plenty of water and avoid sweet beverages.   -Reviewed Health Maintenance: yes

## 2024-06-10 LAB — CBC WITH DIFFERENTIAL/PLATELET
Absolute Lymphocytes: 1442 {cells}/uL (ref 850–3900)
Absolute Monocytes: 359 {cells}/uL (ref 200–950)
Basophils Absolute: 29 {cells}/uL (ref 0–200)
Basophils Relative: 0.5 %
Eosinophils Absolute: 103 {cells}/uL (ref 15–500)
Eosinophils Relative: 1.8 %
HCT: 33.9 % — ABNORMAL LOW (ref 35.0–45.0)
Hemoglobin: 10.6 g/dL — ABNORMAL LOW (ref 11.7–15.5)
MCH: 27 pg (ref 27.0–33.0)
MCHC: 31.3 g/dL — ABNORMAL LOW (ref 32.0–36.0)
MCV: 86.5 fL (ref 80.0–100.0)
MPV: 10.7 fL (ref 7.5–12.5)
Monocytes Relative: 6.3 %
Neutro Abs: 3768 {cells}/uL (ref 1500–7800)
Neutrophils Relative %: 66.1 %
Platelets: 217 Thousand/uL (ref 140–400)
RBC: 3.92 Million/uL (ref 3.80–5.10)
RDW: 13.4 % (ref 11.0–15.0)
Total Lymphocyte: 25.3 %
WBC: 5.7 Thousand/uL (ref 3.8–10.8)

## 2024-06-10 LAB — LIPID PANEL
Cholesterol: 143 mg/dL (ref <200)
HDL: 50 mg/dL (ref >=50)
LDL Cholesterol (Calc): 80 mg/dL
Non-HDL Cholesterol (Calc): 93 mg/dL (ref <130)
Total CHOL/HDL Ratio: 2.9 (calc) (ref <5.0)
Triglycerides: 58 mg/dL (ref <150)

## 2024-06-10 LAB — COMPREHENSIVE METABOLIC PANEL WITH GFR
AG Ratio: 1.7 (calc) (ref 1.0–2.5)
ALT: 11 U/L (ref 6–29)
AST: 16 U/L (ref 10–30)
Albumin: 4.3 g/dL (ref 3.6–5.1)
Alkaline phosphatase (APISO): 44 U/L (ref 31–125)
BUN: 20 mg/dL (ref 7–25)
CO2: 29 mmol/L (ref 20–32)
Calcium: 9.3 mg/dL (ref 8.6–10.2)
Chloride: 104 mmol/L (ref 98–110)
Creat: 0.85 mg/dL (ref 0.50–0.99)
Globulin: 2.5 g/dL (ref 1.9–3.7)
Glucose, Bld: 86 mg/dL (ref 65–99)
Potassium: 3.4 mmol/L — ABNORMAL LOW (ref 3.5–5.3)
Sodium: 140 mmol/L (ref 135–146)
Total Bilirubin: 0.4 mg/dL (ref 0.2–1.2)
Total Protein: 6.8 g/dL (ref 6.1–8.1)
eGFR: 87 mL/min/1.73m2 (ref >=60)

## 2024-06-10 LAB — HEMOGLOBIN A1C
Hgb A1c MFr Bld: 5.1 % (ref <5.7)
Mean Plasma Glucose: 100 mg/dL
eAG (mmol/L): 5.5 mmol/L

## 2024-06-12 ENCOUNTER — Ambulatory Visit: Payer: Self-pay | Admitting: Nurse Practitioner

## 2024-06-15 LAB — CYTOLOGY - PAP
Adequacy: ABSENT
Comment: NEGATIVE
Diagnosis: NEGATIVE
High risk HPV: NEGATIVE

## 2024-08-08 ENCOUNTER — Ambulatory Visit
Admission: RE | Admit: 2024-08-08 | Discharge: 2024-08-08 | Disposition: A | Discharge status: Home / Self Care | Source: Ambulatory Visit | Attending: Nurse Practitioner | Admitting: Nurse Practitioner

## 2024-08-08 DIAGNOSIS — Z1231 Encounter for screening mammogram for malignant neoplasm of breast: Secondary | ICD-10-CM | POA: Diagnosis not present

## 2024-08-08 DIAGNOSIS — Z0000 Encounter for general adult medical examination without abnormal findings: Secondary | ICD-10-CM

## 2024-09-05 ENCOUNTER — Other Ambulatory Visit: Payer: Self-pay | Admitting: Nurse Practitioner

## 2024-09-05 DIAGNOSIS — Z86718 Personal history of other venous thrombosis and embolism: Secondary | ICD-10-CM

## 2024-09-05 DIAGNOSIS — Z86711 Personal history of pulmonary embolism: Secondary | ICD-10-CM

## 2024-09-05 DIAGNOSIS — D6851 Activated protein C resistance: Secondary | ICD-10-CM

## 2024-09-07 ENCOUNTER — Encounter: Payer: Self-pay | Admitting: Nurse Practitioner

## 2024-09-07 NOTE — Telephone Encounter (Signed)
 Requested Prescriptions  Pending Prescriptions Disp Refills   apixaban  (ELIQUIS ) 5 MG TABS tablet [Pharmacy Med Name: ELIQUIS  5 MG TABLET] 180 tablet 0    Sig: TAKE 1 TABLET BY MOUTH TWICE A DAY     Hematology:  Anticoagulants - apixaban  Failed - 09/07/2024 11:23 AM      Failed - HGB in normal range and within 360 days    Hemoglobin  Date Value Ref Range Status  06/09/2024 10.6 (L) 11.7 - 15.5 g/dL Final  96/84/7975 87.6 12.0 - 15.0 g/dL Final  95/85/7978 9.5 (L) 11.1 - 15.9 g/dL Final         Failed - HCT in normal range and within 360 days    HCT  Date Value Ref Range Status  06/09/2024 33.9 (L) 35.0 - 45.0 % Final   Hematocrit  Date Value Ref Range Status  03/06/2020 30.4 (L) 34.0 - 46.6 % Final         Passed - PLT in normal range and within 360 days    Platelets  Date Value Ref Range Status  06/09/2024 217 140 - 400 Thousand/uL Final  03/06/2020 342 150 - 450 x10E3/uL Final   Platelet Count  Date Value Ref Range Status  02/05/2023 224 150 - 400 K/uL Final         Passed - Cr in normal range and within 360 days    Creat  Date Value Ref Range Status  06/09/2024 0.85 0.50 - 0.99 mg/dL Final         Passed - AST in normal range and within 360 days    AST  Date Value Ref Range Status  06/09/2024 16 10 - 30 U/L Final         Passed - ALT in normal range and within 360 days    ALT  Date Value Ref Range Status  06/09/2024 11 6 - 29 U/L Final         Passed - Valid encounter within last 12 months    Recent Outpatient Visits           3 months ago Annual physical exam   [facility] Gareth Mliss FALCON, FNP       Future Appointments             In 5 months Hester Alm BROCKS, MD [facility]

## 2024-09-18 DIAGNOSIS — M25562 Pain in left knee: Secondary | ICD-10-CM | POA: Diagnosis not present

## 2024-09-18 DIAGNOSIS — M778 Other enthesopathies, not elsewhere classified: Secondary | ICD-10-CM | POA: Diagnosis not present

## 2024-09-18 DIAGNOSIS — S56911A Strain of unspecified muscles, fascia and tendons at forearm level, right arm, initial encounter: Secondary | ICD-10-CM | POA: Diagnosis not present

## 2024-10-02 DIAGNOSIS — M25562 Pain in left knee: Secondary | ICD-10-CM | POA: Diagnosis not present

## 2024-11-26 ENCOUNTER — Emergency Department

## 2024-11-26 ENCOUNTER — Emergency Department: Admission: EM | Admit: 2024-11-26 | Discharge: 2024-11-26 | Disposition: A | Discharge status: Home / Self Care

## 2024-11-26 DIAGNOSIS — F10920 Alcohol use, unspecified with intoxication, uncomplicated: Secondary | ICD-10-CM

## 2024-11-26 DIAGNOSIS — R112 Nausea with vomiting, unspecified: Secondary | ICD-10-CM | POA: Insufficient documentation

## 2024-11-26 DIAGNOSIS — Y905 Blood alcohol level of 100-119 mg/100 ml: Secondary | ICD-10-CM | POA: Insufficient documentation

## 2024-11-26 DIAGNOSIS — F10120 Alcohol abuse with intoxication, uncomplicated: Secondary | ICD-10-CM | POA: Insufficient documentation

## 2024-11-26 DIAGNOSIS — J45909 Unspecified asthma, uncomplicated: Secondary | ICD-10-CM | POA: Diagnosis not present

## 2024-11-26 DIAGNOSIS — Z7901 Long term (current) use of anticoagulants: Secondary | ICD-10-CM | POA: Diagnosis not present

## 2024-11-26 DIAGNOSIS — R110 Nausea: Secondary | ICD-10-CM

## 2024-11-26 LAB — BASIC METABOLIC PANEL WITH GFR
Anion gap: 18 — ABNORMAL HIGH (ref 5–15)
BUN: 15 mg/dL (ref 6–20)
CO2: 18 mmol/L — ABNORMAL LOW (ref 22–32)
Calcium: 9.5 mg/dL (ref 8.9–10.3)
Chloride: 105 mmol/L (ref 98–111)
Creatinine, Ser: 0.73 mg/dL (ref 0.44–1.00)
GFR, Estimated: >60 mL/min
Glucose, Bld: 110 mg/dL — ABNORMAL HIGH (ref 70–99)
Potassium: 4.2 mmol/L (ref 3.5–5.1)
Sodium: 140 mmol/L (ref 135–145)

## 2024-11-26 LAB — CBC WITH DIFFERENTIAL/PLATELET
Abs Immature Granulocytes: 0.02 K/uL (ref 0.00–0.07)
Basophils Absolute: 0 K/uL (ref 0.0–0.1)
Basophils Relative: 1 %
Eosinophils Absolute: 0 K/uL (ref 0.0–0.5)
Eosinophils Relative: 1 %
HCT: 34.5 % — ABNORMAL LOW (ref 36.0–46.0)
Hemoglobin: 10.8 g/dL — ABNORMAL LOW (ref 12.0–15.0)
Immature Granulocytes: 1 %
Lymphocytes Relative: 17 %
Lymphs Abs: 0.7 K/uL (ref 0.7–4.0)
MCH: 27.8 pg (ref 26.0–34.0)
MCHC: 31.3 g/dL (ref 30.0–36.0)
MCV: 88.7 fL (ref 80.0–100.0)
Monocytes Absolute: 0.1 K/uL (ref 0.1–1.0)
Monocytes Relative: 2 %
Neutro Abs: 3.1 K/uL (ref 1.7–7.7)
Neutrophils Relative %: 78 %
Platelets: 188 K/uL (ref 150–400)
RBC: 3.89 MIL/uL (ref 3.87–5.11)
RDW: 13.9 % (ref 11.5–15.5)
WBC: 3.9 K/uL — ABNORMAL LOW (ref 4.0–10.5)
nRBC: 0 % (ref 0.0–0.2)

## 2024-11-26 LAB — URINE DRUG SCREEN
Amphetamines: NEGATIVE
Barbiturates: NEGATIVE
Benzodiazepines: NEGATIVE
Cocaine: NEGATIVE
Fentanyl: NEGATIVE
Methadone Scn, Ur: NEGATIVE
Opiates: NEGATIVE
Tetrahydrocannabinol: NEGATIVE

## 2024-11-26 LAB — MAGNESIUM: Magnesium: 2.4 mg/dL (ref 1.7–2.4)

## 2024-11-26 LAB — CBG MONITORING, ED: Glucose-Capillary: 123 mg/dL — ABNORMAL HIGH (ref 70–99)

## 2024-11-26 LAB — ETHANOL: Alcohol, Ethyl (B): 102 mg/dL — ABNORMAL HIGH

## 2024-11-26 LAB — POC URINE PREG, ED: Preg Test, Ur: NEGATIVE

## 2024-11-26 MED ORDER — IPRATROPIUM-ALBUTEROL 0.5-2.5 (3) MG/3ML IN SOLN: 3.0000 mL | Freq: Once | RESPIRATORY_TRACT | Status: DC

## 2024-11-26 MED ORDER — METOCLOPRAMIDE HCL 5 MG/ML IJ SOLN
5.0000 mg | Freq: Once | INTRAMUSCULAR | Status: AC
  Administered 2024-11-26: 5 mg via INTRAVENOUS
  Filled 2024-11-26: qty 2

## 2024-11-26 MED ORDER — FAMOTIDINE IN NACL 20-0.9 MG/50ML-% IV SOLN: 20.0000 mg | Freq: Once | INTRAVENOUS | Status: DC

## 2024-11-26 MED ORDER — ONDANSETRON 4 MG PO TBDP
4.0000 mg | ORAL_TABLET | Freq: Once | ORAL | Status: AC
  Administered 2024-11-26: 4 mg via ORAL
  Filled 2024-11-26: qty 1

## 2024-11-26 MED ORDER — DIPHENHYDRAMINE HCL 50 MG/ML IJ SOLN
12.5000 mg | Freq: Once | INTRAMUSCULAR | Status: AC
  Administered 2024-11-26: 12.5 mg via INTRAVENOUS
  Filled 2024-11-26: qty 1

## 2024-11-26 MED ORDER — LACTATED RINGERS IV BOLUS
1000.0000 mL | Freq: Once | INTRAVENOUS | Status: AC
  Administered 2024-11-26: 1000 mL via INTRAVENOUS

## 2024-11-26 MED ORDER — ONDANSETRON HCL 4 MG/2ML IJ SOLN
4.0000 mg | Freq: Once | INTRAMUSCULAR | Status: AC
  Administered 2024-11-26: 4 mg via INTRAVENOUS
  Filled 2024-11-26: qty 2

## 2024-11-26 MED ORDER — ONDANSETRON 4 MG PO TBDP: 4.0000 mg | ORAL_TABLET | Freq: Four times a day (QID) | ORAL | 1 refills | Status: AC | PRN

## 2024-11-26 NOTE — ED Provider Notes (Signed)
 "  [facility] Provider Note    Event Date/Time   First MD Initiated Contact with Patient 11/26/24 0225     (approximate)   History   No chief complaint on file.   HPI  Tara Shea is a 44 y.o. female with factor V Leiden on Eliquis , PCOS, asthma who presents via EMS for alcohol intoxication.  Patient went out with boyfriend earlier this evening and consumed a copious amount of alcohol of various kind.  She subsequently developed nausea and vomiting and confusion and EMS was called who brought her to our facility.  Patient denies any other substances denies any chest pain abdominal pain changes in urinary habits.  She denies any SI HI or AVH S     Physical Exam   Triage Vital Signs: ED Triage Vitals  Encounter Vitals Group     BP --      Girls Systolic BP Percentile --      Girls Diastolic BP Percentile --      Boys Systolic BP Percentile --      Boys Diastolic BP Percentile --      Pulse Rate 11/26/24 0229 84     Resp 11/26/24 0229 (!) 25     Temp --      Temp src --      SpO2 11/26/24 0229 100 %     Weight --      Height --      Head Circumference --      Peak Flow --      Pain Score 11/26/24 0233 0     Pain Loc --      Pain Education --      Exclude from Growth Chart --     Most recent vital signs: Vitals:   11/26/24 0530 11/26/24 0600  BP: (!) 95/59 95/61  Pulse: 75 76  Resp:    Temp:    SpO2:      Nursing Triage Note reviewed. Vital signs reviewed and patients oxygen saturation is normoxic  General: Patient is well nourished, well developed, awake and alert, resting comfortably in no acute distress, smells of alcohol Head: Normocephalic and atraumatic Eyes: Normal inspection, extraocular muscles intact, no conjunctival pallor Ear, nose, throat: Normal external exam Neck: Normal range of motion Respiratory: Patient is in no respiratory distress, lungs CTAB Cardiovascular: Patient is not tachycardic, RR GI: Abd SNT with  no guarding or rebound  Back: Normal inspection of the back with good strength and range of motion throughout all ext Extremities: pulses intact with good cap refills, no LE pitting edema or calf tenderness Neuro: The patient is alert and oriented to person, place, and time, appropriately conversive, with 5/5 bilat UE/LE strength, no gross motor or sensory defects noted. Coordination appears to be adequate.  Slurring words Skin: Warm, dry, and intact Psych: normal mood and affect, no SI or HI  ED Results / Procedures / Treatments   Labs (all labs ordered are listed, but only abnormal results are displayed) Labs Reviewed  BASIC METABOLIC PANEL WITH GFR - Abnormal; Notable for the following components:      Result Value   CO2 18 (*)    Glucose, Bld 110 (*)    Anion gap 18 (*)    All other components within normal limits  CBC WITH DIFFERENTIAL/PLATELET - Abnormal; Notable for the following components:   WBC 3.9 (*)    Hemoglobin 10.8 (*)    HCT 34.5 (*)  All other components within normal limits  ETHANOL - Abnormal; Notable for the following components:   Alcohol, Ethyl (B) 102 (*)    All other components within normal limits  CBG MONITORING, ED - Abnormal; Notable for the following components:   Glucose-Capillary 123 (*)    All other components within normal limits  URINE DRUG SCREEN  MAGNESIUM  POC URINE PREG, ED     EKG EKG and rhythm strip are interpreted by myself:   EKG: [Normal sinus rhythm] at heart rate of 89, normal QRS duration, QTc 506, nonspecific ST segments and T waves no ectopy EKG not consistent with Acute STEMI Rhythm strip: NSR in lead II   RADIOLOGY CXR: No acute abnormality on my independent review interpretation and radiologist agrees    PROCEDURES:  Critical Care performed: No  Procedures   MEDICATIONS ORDERED IN ED: Medications  famotidine  (PEPCID ) IVPB 20 mg premix (20 mg Intravenous Not Given 11/26/24 0440)  ipratropium-albuterol   (DUONEB) 0.5-2.5 (3) MG/3ML nebulizer solution 3 mL (3 mLs Nebulization Not Given 11/26/24 0441)  ipratropium-albuterol  (DUONEB) 0.5-2.5 (3) MG/3ML nebulizer solution 3 mL (3 mLs Nebulization Not Given 11/26/24 0441)  ondansetron  (ZOFRAN -ODT) disintegrating tablet 4 mg (4 mg Oral Given 11/26/24 0238)  lactated ringers  bolus 1,000 mL (0 mLs Intravenous Stopped 11/26/24 0602)  ondansetron  (ZOFRAN ) injection 4 mg (4 mg Intravenous Given 11/26/24 0309)  diphenhydrAMINE  (BENADRYL ) injection 12.5 mg (12.5 mg Intravenous Given 11/26/24 0358)  metoCLOPramide  (REGLAN ) injection 5 mg (5 mg Intravenous Given 11/26/24 0358)     IMPRESSION / MDM / ASSESSMENT AND PLAN / ED COURSE                                Differential diagnosis includes, but is not limited to, alcohol intoxication, electrolyte derangement, anemia, pneumonia, bronchospasm   ED course: Patient presents confused and mildly agitated with presentation consistent with toxidrome of alcohol.  Initial plan was to get a point-of-care glucose however patient had repeated episodes of nausea vomiting and increased agitation and consequently a bigger workup was initiated.  She did receive 2 doses of Zofran  along with Reglan  Benadryl  and a bolus of lactated Ringer 's with improvement of her nausea.  During her stay in our emergency department, she did repeatedly hit her head on the counter resulting in a small forehead hematoma.  She never lost consciousness and this was done without much force however given the fact that she is on a blood thinner I did consider obtaining a CT head but decided against this as patient seemed to sober up appropriately and denied any worsening headache.  Patient also developed an episode of bronchospasm while in the emergency department.  She was given 2 DuoNebs and a chest x-ray was obtained which demonstrated no pneumonia.  Patient sobered appropriately and was able to finally take p.o. fluids.  Her husband is at bedside and feels  comfortable driving the patient home.  She was counseled to stick to a bland diet and ensure adequate hydration today and she and her husband voiced understanding and requested discharge   Clinical Course as of 11/26/24 0719  [place] Nov 26, 2024  0246 Patient requesting IV insertion and IV fluids [HD]  0511 Magnesium: 2.4 Not low [HD]  0511 DG Chest [facility] Not consistent with pneumonia [HD]  0517 Preg Test, Ur: NEGATIVE [HD]  0517 Magnesium: 2.4 Does not need repletion [HD]  0517 Basic metabolic panel(!) No profound  electrolyte derangements but does have an anion gap which I suspect is secondary to prerenal getting fluids [HD]  0525 CBC with Differential(!) Not profoundly anemic and seems to be at her baseline [HD]  0526 Patient now resting comfortably/sleeping [HD]  0553 Patient able to tolerate drinking water.  Feels improved.  Will work on discharge paperwork [HD]    Clinical Course User Index [HD] Nicholaus Rolland BRAVO, MD   At time of discharge there is no evidence of acute life, limb, vision, or fertility threat. Patient has stable vital signs, pain is well controlled, patient is ambulatory and p.o. tolerant.  Discharge instructions were completed using the EPIC system. I would refer you to those at this time. All warnings prescriptions follow-up etc. were discussed in detail with the patient. Patient indicates understanding and is agreeable with this plan. All questions answered.  Patient is made aware that they may return to the emergency department for any worsening or new condition or for any other emergency.   -- Risk: 5 This patient has a high risk of morbidity due to further diagnostic testing or treatment. Rationale: This patients evaluation and management involve a high risk of morbidity due to the potential severity of presenting symptoms, need for diagnostic testing, and/or initiation of treatment that may require close monitoring. The differential includes conditions  with potential for significant deterioration or requiring escalation of care. Treatment decisions in the ED, including medication administration, procedural interventions, or disposition planning, reflect this level of risk. COPA: 5 The patient has the following acute or chronic illness/injury that poses a possible threat to life or bodily function: [X] : The patient has a potentially serious acute condition or an acute exacerbation of a chronic illness requiring urgent evaluation and management in the Emergency Department. The clinical presentation necessitates immediate consideration of life-threatening or function-threatening diagnoses, even if they are ultimately ruled out.   FINAL CLINICAL IMPRESSION(S) / ED DIAGNOSES   Final diagnoses:  Alcoholic intoxication without complication  Nausea and vomiting, unspecified vomiting type     Rx / DC Orders   ED Discharge Orders          Ordered    ondansetron  (ZOFRAN  ODT) 4 MG disintegrating tablet  Every 6 hours PRN        11/26/24 0555             Note:  This document was prepared using Dragon voice recognition software and may include unintentional dictation errors.   Nicholaus Rolland BRAVO, MD 11/26/24 [phone]  "

## 2024-11-26 NOTE — ED Triage Notes (Signed)
 Pt arrives for ETOH intoxication. Pt has not had any alcohol in a while. Pt is actively vomiting and dry heaving. Pt has a mixture of 7 shots, mixed drinks, and beer.

## 2024-11-26 NOTE — Discharge Instructions (Signed)
 Stick to bland foods today.  Fluids are more important than solids.  Continue your regular medications and return with any acutely worsening symptoms. -- RETURN PRECAUTIONS & AFTERCARE: (ENGLISH) RETURN PRECAUTIONS: Return immediately to the emergency department or see/call your doctor if you feel worse, weak or have changes in speech or vision, are short of breath, have fever, vomiting, pain, bleeding or dark stool, trouble urinating or any new issues. Return here or see/call your doctor if not improving as expected for your suspected condition. FOLLOW-UP CARE: Call your doctor and/or any doctors we referred you to for more advice and to make an appointment. Do this today, tomorrow or after the weekend. Some doctors only take PPO insurance so if you have HMO insurance you may want to contact your HMO or your regular doctor for referral to a specialist within your plan. Either way tell the doctor's office that it was a referral from the emergency department so you get the soonest possible appointment.  YOUR TEST RESULTS: Take result reports of any blood or urine tests, imaging tests and EKG's to your doctor and any referral doctor. Have any abnormal tests repeated. Your doctor or a referral doctor can let you know when this should be done. Also make sure your doctor contacts this hospital to get any test results that are not currently available such as cultures or special tests for infection and final imaging reports, which are often not available at the time you leave the ER but which may list additional important findings that are not documented on the preliminary report. BLOOD PRESSURE: If your blood pressure was greater than 120/80 have your blood pressure rechecked within 1 to 2 weeks. MEDICATION SIDE EFFECTS: Do not drive, walk, bike, take the bus, etc. if you have received or are being prescribed any sedating medications such as those for pain or anxiety or certain antihistamines like Benadryl . If you  have been give one of these here get a taxi home or have a friend drive you home. Ask your pharmacist to counsel you on potential side effects of any new medication

## 2024-12-10 ENCOUNTER — Other Ambulatory Visit: Payer: Self-pay | Admitting: Nurse Practitioner

## 2024-12-10 DIAGNOSIS — Z86718 Personal history of other venous thrombosis and embolism: Secondary | ICD-10-CM

## 2024-12-10 DIAGNOSIS — D6851 Activated protein C resistance: Secondary | ICD-10-CM

## 2024-12-10 DIAGNOSIS — Z86711 Personal history of pulmonary embolism: Secondary | ICD-10-CM

## 2024-12-11 NOTE — Telephone Encounter (Signed)
 Requested Prescriptions  Pending Prescriptions Disp Refills   ELIQUIS  5 MG TABS tablet [Pharmacy Med Name: ELIQUIS  5 MG TABLET] 180 tablet 0    Sig: TAKE 1 TABLET BY MOUTH TWICE A DAY     Hematology:  Anticoagulants - apixaban  Failed - 12/11/2024  3:45 PM      Failed - HGB in normal range and within 360 days    Hemoglobin  Date Value Ref Range Status  11/26/2024 10.8 (L) 12.0 - 15.0 g/dL Final  96/84/7975 87.6 12.0 - 15.0 g/dL Final  95/85/7978 9.5 (L) 11.1 - 15.9 g/dL Final         Failed - HCT in normal range and within 360 days    HCT  Date Value Ref Range Status  11/26/2024 34.5 (L) 36.0 - 46.0 % Final   Hematocrit  Date Value Ref Range Status  03/06/2020 30.4 (L) 34.0 - 46.6 % Final         Passed - PLT in normal range and within 360 days    Platelets  Date Value Ref Range Status  11/26/2024 188 150 - 400 K/uL Final  03/06/2020 342 150 - 450 x10E3/uL Final   Platelet Count  Date Value Ref Range Status  02/05/2023 224 150 - 400 K/uL Final         Passed - Cr in normal range and within 360 days    Creat  Date Value Ref Range Status  06/09/2024 0.85 0.50 - 0.99 mg/dL Final   Creatinine, Ser  Date Value Ref Range Status  11/26/2024 0.73 0.44 - 1.00 mg/dL Final         Passed - AST in normal range and within 360 days    AST  Date Value Ref Range Status  06/09/2024 16 10 - 30 U/L Final         Passed - ALT in normal range and within 360 days    ALT  Date Value Ref Range Status  06/09/2024 11 6 - 29 U/L Final         Passed - Valid encounter within last 12 months    Recent Outpatient Visits           6 months ago Annual physical exam   [facility] Gareth Mliss FALCON, FNP       Future Appointments             In 2 months Hester Alm BROCKS, MD [facility]

## 2025-02-21 ENCOUNTER — Ambulatory Visit: Admitting: Dermatology
